# Patient Record
Sex: Female | Born: 1940 | Race: White | Hispanic: No | Marital: Married | Smoking: Never smoker
Health system: Southern US, Community
[De-identification: ages and names within clinical notes are randomized; demographics above are authoritative.]

## PROBLEM LIST (undated history)

## (undated) DIAGNOSIS — B029 Zoster without complications: Secondary | ICD-10-CM

## (undated) DIAGNOSIS — R0789 Other chest pain: Secondary | ICD-10-CM

## (undated) DIAGNOSIS — E785 Hyperlipidemia, unspecified: Secondary | ICD-10-CM

## (undated) DIAGNOSIS — I10 Essential (primary) hypertension: Secondary | ICD-10-CM

## (undated) DIAGNOSIS — I8390 Asymptomatic varicose veins of unspecified lower extremity: Secondary | ICD-10-CM

## (undated) DIAGNOSIS — E039 Hypothyroidism, unspecified: Secondary | ICD-10-CM

## (undated) DIAGNOSIS — G8929 Other chronic pain: Secondary | ICD-10-CM

## (undated) DIAGNOSIS — K219 Gastro-esophageal reflux disease without esophagitis: Secondary | ICD-10-CM

## (undated) DIAGNOSIS — E079 Disorder of thyroid, unspecified: Secondary | ICD-10-CM

## (undated) DIAGNOSIS — H269 Unspecified cataract: Secondary | ICD-10-CM

## (undated) DIAGNOSIS — M4800 Spinal stenosis, site unspecified: Secondary | ICD-10-CM

## (undated) DIAGNOSIS — M549 Dorsalgia, unspecified: Secondary | ICD-10-CM

## (undated) DIAGNOSIS — E7800 Pure hypercholesterolemia, unspecified: Secondary | ICD-10-CM

## (undated) DIAGNOSIS — Z8719 Personal history of other diseases of the digestive system: Secondary | ICD-10-CM

## (undated) DIAGNOSIS — Z87442 Personal history of urinary calculi: Secondary | ICD-10-CM

## (undated) DIAGNOSIS — M1990 Unspecified osteoarthritis, unspecified site: Secondary | ICD-10-CM

## (undated) DIAGNOSIS — E6609 Other obesity due to excess calories: Secondary | ICD-10-CM

## (undated) HISTORY — PX: TONSILLECTOMY: SUR1361

## (undated) HISTORY — PX: THYROIDECTOMY, PARTIAL: SHX18

## (undated) HISTORY — DX: Disorder of thyroid, unspecified: E07.9

## (undated) HISTORY — DX: Other obesity due to excess calories: E66.09

## (undated) HISTORY — DX: Asymptomatic varicose veins of unspecified lower extremity: I83.90

## (undated) HISTORY — DX: Dorsalgia, unspecified: M54.9

## (undated) HISTORY — DX: Essential (primary) hypertension: I10

## (undated) HISTORY — DX: Hyperlipidemia, unspecified: E78.5

## (undated) HISTORY — DX: Other chest pain: R07.89

## (undated) HISTORY — PX: NISSEN FUNDOPLICATION: SHX2091

## (undated) HISTORY — DX: Pure hypercholesterolemia, unspecified: E78.00

## (undated) HISTORY — PX: CHOLECYSTECTOMY: SHX55

## (undated) HISTORY — PX: APPENDECTOMY: SHX54

## (undated) HISTORY — DX: Other chronic pain: G89.29

## (undated) HISTORY — PX: TONSILLECTOMY AND ADENOIDECTOMY: SHX28

## (undated) HISTORY — DX: Gastro-esophageal reflux disease without esophagitis: K21.9

## (undated) HISTORY — DX: Spinal stenosis, site unspecified: M48.00

## (undated) HISTORY — DX: Unspecified cataract: H26.9

## (undated) HISTORY — DX: Zoster without complications: B02.9

## (undated) HISTORY — DX: Hypothyroidism, unspecified: E03.9

---

## 1975-11-26 HISTORY — PX: COMBINED AUGMENTATION MAMMAPLASTY AND ABDOMINOPLASTY: SUR291

## 1975-11-26 HISTORY — PX: VARICOSE VEIN SURGERY: SHX832

## 1983-11-26 HISTORY — PX: ABDOMINAL HYSTERECTOMY: SHX81

## 1998-11-25 HISTORY — PX: UPPER ENDOSCOPY W/ SCLEROTHERAPY: SHX2606

## 1998-12-11 ENCOUNTER — Other Ambulatory Visit: Admission: RE | Admit: 1998-12-11 | Discharge: 1998-12-11 | Payer: Self-pay | Admitting: Otolaryngology

## 1999-11-26 HISTORY — PX: ROTATOR CUFF REPAIR: SHX139

## 2000-08-04 ENCOUNTER — Other Ambulatory Visit: Admission: RE | Admit: 2000-08-04 | Discharge: 2000-08-04 | Payer: Self-pay | Admitting: *Deleted

## 2000-11-25 HISTORY — PX: HAMMER TOE SURGERY: SHX385

## 2000-11-25 HISTORY — PX: KIDNEY STONE SURGERY: SHX686

## 2001-05-05 ENCOUNTER — Ambulatory Visit (HOSPITAL_COMMUNITY): Admission: AD | Admit: 2001-05-05 | Discharge: 2001-05-05 | Payer: Self-pay | Admitting: Urology

## 2001-05-12 ENCOUNTER — Encounter: Payer: Self-pay | Admitting: Emergency Medicine

## 2001-05-12 ENCOUNTER — Emergency Department (HOSPITAL_COMMUNITY): Admission: EM | Admit: 2001-05-12 | Discharge: 2001-05-12 | Payer: Self-pay | Admitting: Emergency Medicine

## 2001-09-01 ENCOUNTER — Encounter: Admission: RE | Admit: 2001-09-01 | Discharge: 2001-09-01 | Payer: Self-pay | Admitting: Orthopedic Surgery

## 2001-09-01 ENCOUNTER — Encounter: Payer: Self-pay | Admitting: Orthopedic Surgery

## 2002-09-22 ENCOUNTER — Ambulatory Visit (HOSPITAL_COMMUNITY): Admission: RE | Admit: 2002-09-22 | Discharge: 2002-09-22 | Payer: Self-pay | Admitting: Gastroenterology

## 2002-09-27 ENCOUNTER — Encounter: Payer: Self-pay | Admitting: Gastroenterology

## 2002-09-27 ENCOUNTER — Encounter: Admission: RE | Admit: 2002-09-27 | Discharge: 2002-09-27 | Payer: Self-pay | Admitting: Gastroenterology

## 2002-10-08 ENCOUNTER — Encounter: Admission: RE | Admit: 2002-10-08 | Discharge: 2002-10-08 | Payer: Self-pay | Admitting: Surgery

## 2002-10-08 ENCOUNTER — Encounter: Payer: Self-pay | Admitting: Surgery

## 2002-10-26 ENCOUNTER — Encounter: Admission: RE | Admit: 2002-10-26 | Discharge: 2002-10-26 | Payer: Self-pay | Admitting: Gastroenterology

## 2002-10-26 ENCOUNTER — Encounter: Payer: Self-pay | Admitting: Gastroenterology

## 2002-11-23 ENCOUNTER — Ambulatory Visit (HOSPITAL_COMMUNITY): Admission: RE | Admit: 2002-11-23 | Discharge: 2002-11-23 | Payer: Self-pay | Admitting: Gastroenterology

## 2002-11-25 HISTORY — PX: HERNIA REPAIR: SHX51

## 2002-12-06 ENCOUNTER — Encounter: Payer: Self-pay | Admitting: Surgery

## 2002-12-08 ENCOUNTER — Encounter: Payer: Self-pay | Admitting: Surgery

## 2002-12-08 ENCOUNTER — Inpatient Hospital Stay (HOSPITAL_COMMUNITY): Admission: RE | Admit: 2002-12-08 | Discharge: 2002-12-12 | Payer: Self-pay | Admitting: Surgery

## 2002-12-09 ENCOUNTER — Encounter: Payer: Self-pay | Admitting: Surgery

## 2003-11-26 HISTORY — PX: SPINAL FUSION: SHX223

## 2004-07-26 HISTORY — PX: BREAST BIOPSY: SHX20

## 2004-08-09 ENCOUNTER — Inpatient Hospital Stay (HOSPITAL_COMMUNITY): Admission: RE | Admit: 2004-08-09 | Discharge: 2004-08-14 | Payer: Self-pay | Admitting: Specialist

## 2004-10-25 HISTORY — PX: EYE SURGERY: SHX253

## 2005-05-20 ENCOUNTER — Encounter: Admission: RE | Admit: 2005-05-20 | Discharge: 2005-05-20 | Payer: Self-pay | Admitting: Cardiology

## 2005-05-22 ENCOUNTER — Encounter: Admission: RE | Admit: 2005-05-22 | Discharge: 2005-05-22 | Payer: Self-pay | Admitting: Orthopedic Surgery

## 2005-06-06 ENCOUNTER — Inpatient Hospital Stay (HOSPITAL_COMMUNITY): Admission: AD | Admit: 2005-06-06 | Discharge: 2005-06-07 | Payer: Self-pay | Admitting: Otolaryngology

## 2006-01-15 ENCOUNTER — Encounter: Admission: RE | Admit: 2006-01-15 | Discharge: 2006-01-15 | Payer: Self-pay | Admitting: Gastroenterology

## 2010-11-25 HISTORY — PX: ROTATOR CUFF REPAIR: SHX139

## 2010-12-16 ENCOUNTER — Encounter: Payer: Self-pay | Admitting: Cardiology

## 2010-12-16 ENCOUNTER — Encounter: Payer: Self-pay | Admitting: Orthopedic Surgery

## 2011-03-27 ENCOUNTER — Encounter: Payer: Self-pay | Admitting: Orthopedic Surgery

## 2011-04-19 ENCOUNTER — Encounter: Payer: Self-pay | Admitting: Physician Assistant

## 2011-05-21 ENCOUNTER — Encounter: Payer: Self-pay | Admitting: Cardiology

## 2011-07-24 ENCOUNTER — Ambulatory Visit: Payer: Self-pay | Admitting: Cardiology

## 2011-10-02 ENCOUNTER — Encounter: Payer: Self-pay | Admitting: Cardiology

## 2011-10-02 ENCOUNTER — Ambulatory Visit (INDEPENDENT_AMBULATORY_CARE_PROVIDER_SITE_OTHER): Payer: Self-pay | Admitting: Cardiology

## 2011-10-02 VITALS — BP 140/80 | HR 78 | Ht 63.0 in | Wt 198.0 lb

## 2011-10-02 DIAGNOSIS — I119 Hypertensive heart disease without heart failure: Secondary | ICD-10-CM

## 2011-10-02 DIAGNOSIS — R519 Headache, unspecified: Secondary | ICD-10-CM | POA: Insufficient documentation

## 2011-10-02 DIAGNOSIS — R51 Headache: Secondary | ICD-10-CM

## 2011-10-02 DIAGNOSIS — E7800 Pure hypercholesterolemia, unspecified: Secondary | ICD-10-CM | POA: Insufficient documentation

## 2011-10-02 LAB — HEPATIC FUNCTION PANEL
Bilirubin, Direct: 0.1 mg/dL (ref 0.0–0.3)
Total Bilirubin: 1.7 mg/dL — ABNORMAL HIGH (ref 0.3–1.2)

## 2011-10-02 LAB — LIPID PANEL
Cholesterol: 176 mg/dL (ref 0–200)
HDL: 63.4 mg/dL (ref >39.00)

## 2011-10-02 LAB — BASIC METABOLIC PANEL
BUN: 18 mg/dL (ref 6–23)
CO2: 32 mEq/L (ref 19–32)
Calcium: 9.7 mg/dL (ref 8.4–10.5)
Chloride: 104 mEq/L (ref 96–112)
Glucose, Bld: 88 mg/dL (ref 70–99)
Sodium: 144 mEq/L (ref 135–145)

## 2011-10-02 MED ORDER — LOSARTAN POTASSIUM 50 MG PO TABS: 50.0000 mg | ORAL_TABLET | Freq: Every day | ORAL | Status: DC

## 2011-10-02 NOTE — Progress Notes (Signed)
Benjiman Core Date of Birth:  1941-05-24 [facility] [address]   [address] [place], [place]  [address] [phone]           Fax   [phone]  History of Present Illness: This pleasant 70 year old married Caucasian woman is seen for valuation of high blood pressure.  She has a past history of essential hypertension.  More recently she has been on no blood pressure medications.  She had been able to control her blood pressure adequately with diet and exercise.  However she has noted some recent morning headaches and has been checking her blood pressure and has noted has been as high as 154/89 and 161/74 recently.  She has gained weight recently but is trying to avoid dietary salt.  She has not been experiencing any chest pain.  She does not have any history of ischemic heart disease and she had a normal adenosine Cardiolite stress test in 2005  Current Outpatient Prescriptions  Medication Sig Dispense Refill  . aspirin 81 MG tablet Take 81 mg by mouth daily.        . celecoxib (CELEBREX) 200 MG capsule Take 200 mg by mouth 2 (two) times daily. As needed       . Cholecalciferol (VITAMIN D PO) Take by mouth. 50,000 2 tab weekly       . esomeprazole (NEXIUM) 40 MG capsule Take 40 mg by mouth daily before breakfast.        . estradiol (CLIMARA) 0.025 mg/24hr Place 1 patch onto the skin once a week.        . levothyroxine (SYNTHROID, LEVOTHROID) 100 MCG tablet Take 100 mcg by mouth daily. Taking 10 tablets weekly       . Multiple Vitamin (MULTIVITAMIN PO) Take by mouth daily.        . niacin 250 MG CR capsule Take 250 mg by mouth at bedtime.        . simvastatin (ZOCOR) 20 MG tablet Take 20 mg by mouth at bedtime.        Marland Kitchen losartan (COZAAR) 50 MG tablet Take 1 tablet (50 mg total) by mouth daily.  30 tablet  11    Not on File  There is no problem list on file for this patient.   History  Smoking status  . Never Smoker   Smokeless tobacco  . Not on file      History  Alcohol Use: Not on file    No family history on file.  Review of Systems: Constitutional: no fever chills diaphoresis or fatigue or change in weight.  Head and neck: no hearing loss, no epistaxis, no photophobia or visual disturbance.  The patient is clinically euthyroid on her current thyroid replacement. Respiratory: No cough, shortness of breath or wheezing. Cardiovascular: No chest pain peripheral edema, palpitations. Gastrointestinal: No abdominal distention, no abdominal pain, no change in bowel habits hematochezia or melena. Genitourinary: No dysuria, no frequency, no urgency, no nocturia. Musculoskeletal:No arthralgias, no back pain, no gait disturbance or myalgias. Neurological: No dizziness, no headaches, no numbness, no seizures, no syncope, no weakness, no tremors. Hematologic: No lymphadenopathy, no easy bruising. Psychiatric: No confusion, no hallucinations, no sleep disturbance.    Physical Exam: Filed Vitals:   10/02/11 1159  BP: 140/80  Pulse: 78   general appearance reveals a well-developed well-nourished woman in no distress.The head and neck exam reveals pupils equal and reactive.  Extraocular movements are full.  There is no scleral icterus.  The mouth and pharynx are normal.  The neck is supple.  The carotids reveal no bruits.  The jugular venous pressure is normal.  The  thyroid is not enlarged.  There is no lymphadenopathy.  The chest is clear to percussion and auscultation.  There are no rales or rhonchi.  Expansion of the chest is symmetrical.  The precordium is quiet.  The first heart sound is normal.  The second heart sound is physiologically split.  There is no murmur gallop rub or click.  There is no abnormal lift or heave.  The abdomen is soft and nontender.  The bowel sounds are normal.  The liver and spleen are not enlarged.  There are no abdominal masses.  There are no abdominal bruits.  Extremities reveal good pedal pulses.  There is no  phlebitis or edema.  There is no cyanosis or clubbing.  Strength is normal and symmetrical in all extremities.  There is no lateralizing weakness.  There are no sensory deficits.  The skin is warm and dry.  There is no rash.     Assessment / Plan: Begin losartan 50 mg one daily.  Recheck in 4 months for followup office visit.  She will call us with  response to this medication.

## 2011-10-02 NOTE — Patient Instructions (Addendum)
Will start you on Losartan 50 mg daily, this has been sent to [facility] Will obtain labs today and call you with the results Your physician recommends that you schedule a follow-up appointment in: 4 months

## 2011-10-02 NOTE — Assessment & Plan Note (Signed)
The patient has a history of hypercholesterolemia.  Her last labs were done at Dr. Enid Derry office in May showing a cholesterol of 141 and a triglyceride of 75.  We are rechecking labs today

## 2011-10-02 NOTE — Assessment & Plan Note (Signed)
In the past the patient has been on lisinopril HCTZ she did not tolerate because of nausea.  Her blood pressure today is elevated.  I checked it myself and got 146/94.  We then checked her home machine which read 150/85.  We will start her on losartan 50 mg one daily

## 2011-10-02 NOTE — Assessment & Plan Note (Signed)
Her headaches it tends to occur in the morning.  They are dull.  They relieved by aspirin.  She is not having symptoms of migraine.

## 2011-10-04 ENCOUNTER — Telehealth: Payer: Self-pay | Admitting: *Deleted

## 2011-10-04 NOTE — Telephone Encounter (Signed)
Message copied by Burnell Blanks on Fri Oct 04, 2011  4:53 PM ------      Message from: Cassell Clement      Created: Wed Oct 02, 2011  6:12 PM       Please report.  The liver tests are good except for slight elevation of bilirubin which does not appear to be significant.  The electrolytes are normal blood sugar is normal and the kidney function is normal.  Her lipi.ds are normal triglycerides are normal.  Continue present medication

## 2011-10-04 NOTE — Telephone Encounter (Signed)
Message copied by Burnell Blanks on Fri Oct 04, 2011  5:04 PM ------      Message from: Cassell Clement      Created: Wed Oct 02, 2011  6:12 PM       Please report.  The liver tests are good except for slight elevation of bilirubin which does not appear to be significant.  The electrolytes are normal blood sugar is normal and the kidney function is normal.  Her lipi.ds are normal triglycerides are normal.  Continue present medication

## 2011-10-04 NOTE — Telephone Encounter (Signed)
Advised of labs and mailed copy 

## 2011-10-04 NOTE — Telephone Encounter (Signed)
Advised of labs 

## 2012-02-04 ENCOUNTER — Encounter: Payer: Self-pay | Admitting: Cardiology

## 2012-02-04 ENCOUNTER — Other Ambulatory Visit: Payer: Self-pay

## 2012-02-04 ENCOUNTER — Ambulatory Visit (INDEPENDENT_AMBULATORY_CARE_PROVIDER_SITE_OTHER): Payer: Medicare Other | Admitting: Cardiology

## 2012-02-04 VITALS — BP 132/88 | HR 68 | Ht 62.0 in | Wt 204.4 lb

## 2012-02-04 DIAGNOSIS — E7800 Pure hypercholesterolemia, unspecified: Secondary | ICD-10-CM

## 2012-02-04 DIAGNOSIS — K219 Gastro-esophageal reflux disease without esophagitis: Secondary | ICD-10-CM

## 2012-02-04 DIAGNOSIS — I119 Hypertensive heart disease without heart failure: Secondary | ICD-10-CM

## 2012-02-04 LAB — BASIC METABOLIC PANEL
BUN: 18 mg/dL (ref 6–23)
Chloride: 104 mEq/L (ref 96–112)
Glucose, Bld: 99 mg/dL (ref 70–99)
Potassium: 3.8 mEq/L (ref 3.5–5.1)
Sodium: 140 mEq/L (ref 135–145)

## 2012-02-04 LAB — HEPATIC FUNCTION PANEL
ALT: 13 U/L (ref 0–35)
Bilirubin, Direct: 0.1 mg/dL (ref 0.0–0.3)
Total Bilirubin: 0.8 mg/dL (ref 0.3–1.2)

## 2012-02-04 LAB — LIPID PANEL
HDL: 58.6 mg/dL (ref >39.00)
LDL Cholesterol: 72 mg/dL (ref 0–99)
Triglycerides: 116 mg/dL (ref 0.0–149.0)

## 2012-02-04 MED ORDER — OMEPRAZOLE-SODIUM BICARBONATE 40-1100 MG PO CAPS: 1.0000 | ORAL_CAPSULE | Freq: Every day | ORAL | Status: DC

## 2012-02-04 MED ORDER — PANTOPRAZOLE SODIUM 40 MG PO TBEC: 40.0000 mg | DELAYED_RELEASE_TABLET | Freq: Every day | ORAL | Status: DC

## 2012-02-04 MED ORDER — SIMVASTATIN 40 MG PO TABS: 40.0000 mg | ORAL_TABLET | Freq: Every day | ORAL | Status: DC

## 2012-02-04 NOTE — Assessment & Plan Note (Signed)
The patient has had a good clinical response to Zegerid.  We renewed her prescription today.

## 2012-02-04 NOTE — Patient Instructions (Addendum)
Will obtain labs today and call you with the results (lp/bmet/hfp)  Your physician wants you to follow-up in: 4 month You will receive a reminder letter in the mail two months in advance. If you don't receive a letter, please call our office to schedule the follow-up appointment.

## 2012-02-04 NOTE — Progress Notes (Signed)
Benjiman Core Date of Birth:  1941/05/21 [facility] [address] [place], [place]  [address] [phone]  Fax   [phone]  HPI: This pleasant 71 year old woman is seen for a scheduled four-month followup office visit.  She is a past history of essential hypertension and a history of hypercholesterolemia.  She also has a lot of orthopedic problems and problems with arthritis.  She also has a history of hypothyroidism and a history of gastroesophageal reflux.  She has been monitoring her blood pressure at home and it has been in the normal range.  She does not have any history of ischemic heart disease and she had a normal adenosine Cardiolite stress test in 2005.  Current Outpatient Prescriptions  Medication Sig Dispense Refill  . aspirin 81 MG tablet Take 81 mg by mouth daily.        . celecoxib (CELEBREX) 200 MG capsule Take 200 mg by mouth 2 (two) times daily. As needed       . Cholecalciferol (VITAMIN D PO) Take by mouth. Every other week      . esomeprazole (NEXIUM) 40 MG capsule Take 40 mg by mouth daily before breakfast.        . estradiol (CLIMARA) 0.025 mg/24hr Place 1 patch onto the skin once a week.        Marland Kitchen ibuprofen (ADVIL,MOTRIN) 200 MG tablet Take 200 mg by mouth every 6 (six) hours as needed.      Marland Kitchen levothyroxine (SYNTHROID, LEVOTHROID) 150 MCG tablet Take 150 mcg by mouth daily.      Marland Kitchen losartan (COZAAR) 50 MG tablet Take 1 tablet (50 mg total) by mouth daily.  30 tablet  11  . Multiple Vitamin (MULTIVITAMIN PO) Take by mouth daily.        . multivitamin-lutein (OCUVITE-LUTEIN) CAPS Take 1 capsule by mouth daily.      . niacin 250 MG CR capsule Take 250 mg by mouth at bedtime.        . simvastatin (ZOCOR) 40 MG tablet Take 1 tablet (40 mg total) by mouth at bedtime.  30 tablet  11  . omeprazole-sodium bicarbonate (ZEGERID) 40-1100 MG per capsule Take 1 capsule by mouth daily before breakfast.  30 capsule  11    No Known Allergies  Patient  Active Problem List  Diagnoses  . Benign hypertensive heart disease without heart failure  . Pure hypercholesterolemia  . Headache    History  Smoking status  . Never Smoker   Smokeless tobacco  . Not on file    History  Alcohol Use: Not on file    No family history on file.  Review of Systems: The patient denies any heat or cold intolerance.  No weight gain or weight loss.  The patient denies headaches or blurry vision.  There is no cough or sputum production.  The patient denies dizziness.  There is no hematuria or hematochezia.  The patient denies any muscle aches or arthritis.  The patient denies any rash.  The patient denies frequent falling or instability.  There is no history of depression or anxiety.  All other systems were reviewed and are negative.   Physical Exam: Filed Vitals:   02/04/12 0855  BP: 132/88  Pulse: 68   the general appearance reveals a well-developed well-nourished woman in no distress.The head and neck exam reveals pupils equal and reactive.  Extraocular movements are full.  There is no scleral icterus.  The mouth and pharynx are normal.  The  neck is supple.  The carotids reveal no bruits.  The jugular venous pressure is normal.  The  thyroid is not enlarged.  There is no lymphadenopathy.  The chest is clear to percussion and auscultation.  There are no rales or rhonchi.  Expansion of the chest is symmetrical.  The precordium is quiet.  The first heart sound is normal.  The second heart sound is physiologically split.  There is no murmur gallop rub or click.  There is no abnormal lift or heave.  The abdomen is soft and nontender.  The bowel sounds are normal.  The liver and spleen are not enlarged.  There are no abdominal masses.  There are no abdominal bruits.  Extremities reveal good pedal pulses.  There is no phlebitis or edema.  There is no cyanosis or clubbing.  Strength is normal and symmetrical in all extremities.  There is no lateralizing weakness.   There are no sensory deficits.  The skin is warm and dry.  There is no rash.      Assessment / Plan: She will continue to work hard on her diet to lose weight.  Continue regular aerobic exercise.  Continue present medications.  Blood work today pending.  Going to send a copy of her lab work to Dr. Evlyn Kanner.  She will be rechecked in 4 months for followup office visit and fasting lab work

## 2012-02-04 NOTE — Progress Notes (Signed)
Quick Note:  Please report to patient. The recent labs are stable. Continue same medication and careful diet. LDL better. ______ 

## 2012-02-04 NOTE — Assessment & Plan Note (Signed)
No chest pain or shortness of breath.  The patient has been using her home treadmill for exercise.

## 2012-02-04 NOTE — Assessment & Plan Note (Signed)
The patient has been on simvastatin 40 mg daily rather than 20 mg daily.  We are checking lab work today.  She is not having any side effects from the simvastatin such as myalgias.

## 2012-02-05 ENCOUNTER — Other Ambulatory Visit: Payer: Self-pay | Admitting: *Deleted

## 2012-02-05 NOTE — Telephone Encounter (Signed)
Pharmacy called regarding pts Zegerid and if the prior Auth was completed. Per Juliette Alcide it has not been done yet, she has to talk to the Doctor about it...Marland Kitchenoc rma

## 2012-02-06 ENCOUNTER — Telehealth: Payer: Self-pay | Admitting: *Deleted

## 2012-02-06 NOTE — Telephone Encounter (Signed)
Advised of labs 

## 2012-02-06 NOTE — Telephone Encounter (Signed)
Message copied by Burnell Blanks on Thu Feb 06, 2012  5:30 PM ------      Message from: Cassell Clement      Created: Tue Feb 04, 2012  9:56 PM       Please report to patient.  The recent labs are stable. Continue same medication and careful diet.LDL better.

## 2012-02-11 ENCOUNTER — Encounter: Payer: Self-pay | Admitting: *Deleted

## 2012-02-19 ENCOUNTER — Other Ambulatory Visit: Payer: Self-pay | Admitting: *Deleted

## 2012-02-25 ENCOUNTER — Telehealth: Payer: Self-pay

## 2012-02-25 MED ORDER — OMEPRAZOLE-SODIUM BICARBONATE 40-1100 MG PO CAPS: 1.0000 | ORAL_CAPSULE | Freq: Every day | ORAL | Status: DC

## 2012-02-25 NOTE — Telephone Encounter (Signed)
New Msg: Pharmacy calling to get Pre-Auth for zegerid. Please return call to pharmacy to discuss further.

## 2012-02-25 NOTE — Telephone Encounter (Signed)
Left message working on prior authorization.   Dr. Patty Sermons needs to sign and will fax back

## 2012-03-04 ENCOUNTER — Telehealth: Payer: Self-pay | Admitting: *Deleted

## 2012-03-04 ENCOUNTER — Telehealth: Payer: Self-pay | Admitting: Cardiology

## 2012-03-04 NOTE — Telephone Encounter (Signed)
left message  Regis Bill, LPN 03/03/8118 14:78 AM Signed  Patient insurance will not cover medication however the lower dose Zegrid 20/1,100 is OTC. Will discuss with patient her getting OTC and getting Omeprazole 20 mg Rx and taking together. Left message at patients home to call back

## 2012-03-04 NOTE — Telephone Encounter (Signed)
Needing to know about Pre-Auth for Zegerid.  Last note stated was needing Dr. Patty Sermons to sign and to be faxed back.

## 2012-03-04 NOTE — Telephone Encounter (Signed)
Fu call °Patient returning your call °

## 2012-03-04 NOTE — Telephone Encounter (Signed)
Patient insurance will not cover medication however the lower dose Zegrid 20/1,100 is OTC.  Will discuss with patient her getting OTC and getting Omeprazole 20 mg Rx and taking together.  Left message at patients home to call back

## 2012-03-25 HISTORY — PX: ROTATOR CUFF REPAIR: SHX139

## 2012-03-31 ENCOUNTER — Encounter (INDEPENDENT_AMBULATORY_CARE_PROVIDER_SITE_OTHER): Payer: Self-pay | Admitting: Ophthalmology

## 2012-04-08 ENCOUNTER — Encounter (INDEPENDENT_AMBULATORY_CARE_PROVIDER_SITE_OTHER): Payer: Medicare Other | Admitting: Ophthalmology

## 2012-04-08 DIAGNOSIS — H43819 Vitreous degeneration, unspecified eye: Secondary | ICD-10-CM

## 2012-04-08 DIAGNOSIS — H2510 Age-related nuclear cataract, unspecified eye: Secondary | ICD-10-CM

## 2012-04-08 DIAGNOSIS — H33309 Unspecified retinal break, unspecified eye: Secondary | ICD-10-CM

## 2012-04-08 NOTE — Telephone Encounter (Signed)
Patient never called back, did speak with pharmacist previously regarding medications

## 2012-05-13 ENCOUNTER — Encounter: Payer: Self-pay | Admitting: Physician Assistant

## 2012-05-13 ENCOUNTER — Observation Stay (HOSPITAL_COMMUNITY): Payer: PRIVATE HEALTH INSURANCE

## 2012-05-13 ENCOUNTER — Observation Stay (HOSPITAL_COMMUNITY)
Admission: AD | Admit: 2012-05-13 | Discharge: 2012-05-14 | Disposition: A | Discharge status: Home / Self Care | Payer: PRIVATE HEALTH INSURANCE | Source: Ambulatory Visit | Attending: Cardiology | Admitting: Cardiology

## 2012-05-13 ENCOUNTER — Encounter (HOSPITAL_COMMUNITY): Payer: Self-pay | Admitting: General Practice

## 2012-05-13 ENCOUNTER — Ambulatory Visit (INDEPENDENT_AMBULATORY_CARE_PROVIDER_SITE_OTHER): Payer: Medicare Other | Admitting: Physician Assistant

## 2012-05-13 VITALS — BP 135/76 | HR 67 | Ht 63.0 in | Wt 203.0 lb

## 2012-05-13 DIAGNOSIS — M4800 Spinal stenosis, site unspecified: Secondary | ICD-10-CM | POA: Insufficient documentation

## 2012-05-13 DIAGNOSIS — G8929 Other chronic pain: Secondary | ICD-10-CM | POA: Insufficient documentation

## 2012-05-13 DIAGNOSIS — R079 Chest pain, unspecified: Principal | ICD-10-CM | POA: Diagnosis present

## 2012-05-13 DIAGNOSIS — E7800 Pure hypercholesterolemia, unspecified: Secondary | ICD-10-CM | POA: Diagnosis present

## 2012-05-13 DIAGNOSIS — I10 Essential (primary) hypertension: Secondary | ICD-10-CM | POA: Insufficient documentation

## 2012-05-13 DIAGNOSIS — E039 Hypothyroidism, unspecified: Secondary | ICD-10-CM | POA: Insufficient documentation

## 2012-05-13 DIAGNOSIS — M549 Dorsalgia, unspecified: Secondary | ICD-10-CM | POA: Insufficient documentation

## 2012-05-13 DIAGNOSIS — K219 Gastro-esophageal reflux disease without esophagitis: Secondary | ICD-10-CM | POA: Insufficient documentation

## 2012-05-13 DIAGNOSIS — E785 Hyperlipidemia, unspecified: Secondary | ICD-10-CM | POA: Insufficient documentation

## 2012-05-13 DIAGNOSIS — E669 Obesity, unspecified: Secondary | ICD-10-CM | POA: Insufficient documentation

## 2012-05-13 DIAGNOSIS — I119 Hypertensive heart disease without heart failure: Secondary | ICD-10-CM

## 2012-05-13 HISTORY — DX: Personal history of other diseases of the digestive system: Z87.19

## 2012-05-13 HISTORY — DX: Unspecified osteoarthritis, unspecified site: M19.90

## 2012-05-13 LAB — COMPREHENSIVE METABOLIC PANEL
Albumin: 3.8 g/dL (ref 3.5–5.2)
Alkaline Phosphatase: 81 U/L (ref 39–117)
BUN: 27 mg/dL — ABNORMAL HIGH (ref 6–23)
Chloride: 101 mEq/L (ref 96–112)
Creatinine, Ser: 0.67 mg/dL (ref 0.50–1.10)
GFR calc Af Amer: >90 mL/min (ref >90)
Glucose, Bld: 93 mg/dL (ref 70–99)
Potassium: 3.6 mEq/L (ref 3.5–5.1)
Total Bilirubin: 0.9 mg/dL (ref 0.3–1.2)
Total Protein: 7.2 g/dL (ref 6.0–8.3)

## 2012-05-13 LAB — CARDIAC PANEL(CRET KIN+CKTOT+MB+TROPI)
CK, MB: 2.3 ng/mL (ref 0.3–4.0)
Relative Index: INVALID (ref 0.0–2.5)
Troponin I: <0.3 ng/mL (ref <0.30)
Troponin I: <0.3 ng/mL (ref <0.30)

## 2012-05-13 LAB — CBC
HCT: 38.1 % (ref 36.0–46.0)
Hemoglobin: 12.7 g/dL (ref 12.0–15.0)
MCH: 27.7 pg (ref 26.0–34.0)
MCHC: 33.3 g/dL (ref 30.0–36.0)
MCV: 83.2 fL (ref 78.0–100.0)
RBC: 4.58 MIL/uL (ref 3.87–5.11)

## 2012-05-13 LAB — PROTIME-INR: INR: 0.96 (ref 0.00–1.49)

## 2012-05-13 LAB — DIFFERENTIAL
Basophils Relative: 1 % (ref 0–1)
Eosinophils Absolute: 0 10*3/uL (ref 0.0–0.7)
Lymphs Abs: 2.3 10*3/uL (ref 0.7–4.0)
Monocytes Absolute: 0.7 10*3/uL (ref 0.1–1.0)
Monocytes Relative: 8 % (ref 3–12)
Neutrophils Relative %: 62 % (ref 43–77)

## 2012-05-13 MED ORDER — IBUPROFEN 200 MG PO TABS
200.0000 mg | ORAL_TABLET | Freq: Four times a day (QID) | ORAL | Status: DC | PRN
  Filled 2012-05-13: qty 1

## 2012-05-13 MED ORDER — ONDANSETRON HCL 4 MG/2ML IJ SOLN: 4.0000 mg | Freq: Four times a day (QID) | INTRAMUSCULAR | Status: DC | PRN

## 2012-05-13 MED ORDER — ESTRADIOL 0.025 MG/24HR TD PTWK
0.0250 mg | MEDICATED_PATCH | TRANSDERMAL | Status: DC
  Filled 2012-05-13: qty 1

## 2012-05-13 MED ORDER — SODIUM CHLORIDE 0.9 % IJ SOLN
3.0000 mL | Freq: Two times a day (BID) | INTRAMUSCULAR | Status: DC
  Administered 2012-05-13: 3 mL via INTRAVENOUS

## 2012-05-13 MED ORDER — ASPIRIN 81 MG PO CHEW
324.0000 mg | CHEWABLE_TABLET | ORAL | Status: AC
  Administered 2012-05-13: 324 mg via ORAL
  Filled 2012-05-13: qty 4

## 2012-05-13 MED ORDER — NIACIN ER 250 MG PO CPCR
250.0000 mg | ORAL_CAPSULE | Freq: Every day | ORAL | Status: DC
  Administered 2012-05-13: 250 mg via ORAL
  Filled 2012-05-13 (×2): qty 1

## 2012-05-13 MED ORDER — ACETAMINOPHEN 325 MG PO TABS: 650.0000 mg | ORAL_TABLET | ORAL | Status: DC | PRN

## 2012-05-13 MED ORDER — LEVOTHYROXINE SODIUM 150 MCG PO TABS
150.0000 ug | ORAL_TABLET | Freq: Every day | ORAL | Status: DC
  Administered 2012-05-14: 150 ug via ORAL
  Filled 2012-05-13 (×2): qty 1

## 2012-05-13 MED ORDER — OCUVITE-LUTEIN PO CAPS
1.0000 | ORAL_CAPSULE | Freq: Every day | ORAL | Status: DC
  Administered 2012-05-14: 1 via ORAL
  Filled 2012-05-13 (×2): qty 1

## 2012-05-13 MED ORDER — ENOXAPARIN SODIUM 40 MG/0.4ML ~~LOC~~ SOLN
40.0000 mg | SUBCUTANEOUS | Status: DC
  Administered 2012-05-13: 40 mg via SUBCUTANEOUS
  Filled 2012-05-13 (×2): qty 0.4

## 2012-05-13 MED ORDER — SODIUM CHLORIDE 0.9 % IJ SOLN: 3.0000 mL | INTRAMUSCULAR | Status: DC | PRN

## 2012-05-13 MED ORDER — LOSARTAN POTASSIUM 50 MG PO TABS
50.0000 mg | ORAL_TABLET | Freq: Every day | ORAL | Status: DC
  Administered 2012-05-14: 50 mg via ORAL
  Filled 2012-05-13 (×2): qty 1

## 2012-05-13 MED ORDER — SODIUM CHLORIDE 0.9 % IV SOLN: 250.0000 mL | INTRAVENOUS | Status: DC | PRN

## 2012-05-13 MED ORDER — NITROGLYCERIN 0.4 MG SL SUBL: 0.4000 mg | SUBLINGUAL_TABLET | SUBLINGUAL | Status: DC | PRN

## 2012-05-13 MED ORDER — ASPIRIN 81 MG PO TABS: 81.0000 mg | ORAL_TABLET | Freq: Every day | ORAL | Status: DC

## 2012-05-13 MED ORDER — PANTOPRAZOLE SODIUM 40 MG PO TBEC
80.0000 mg | DELAYED_RELEASE_TABLET | Freq: Every day | ORAL | Status: DC
  Administered 2012-05-13: 80 mg via ORAL
  Filled 2012-05-13: qty 1

## 2012-05-13 MED ORDER — ASPIRIN 300 MG RE SUPP
300.0000 mg | RECTAL | Status: AC
  Filled 2012-05-13: qty 1

## 2012-05-13 MED ORDER — CELECOXIB 200 MG PO CAPS
200.0000 mg | ORAL_CAPSULE | Freq: Two times a day (BID) | ORAL | Status: DC
  Filled 2012-05-13 (×3): qty 1

## 2012-05-13 MED ORDER — SIMVASTATIN 40 MG PO TABS
40.0000 mg | ORAL_TABLET | Freq: Every day | ORAL | Status: DC
  Administered 2012-05-13: 40 mg via ORAL
  Filled 2012-05-13 (×2): qty 1

## 2012-05-13 MED ORDER — ASPIRIN EC 81 MG PO TBEC
81.0000 mg | DELAYED_RELEASE_TABLET | Freq: Every day | ORAL | Status: DC
  Administered 2012-05-14: 81 mg via ORAL
  Filled 2012-05-13: qty 1

## 2012-05-13 NOTE — Progress Notes (Signed)
[address] [place], [place]  [address] Phone: [phone] Fax:  [phone]  Date:  05/13/2012   Name:  Theresa Holmes   DOB:  [birth year]   MRN:  [id number]  PCP:  Julian Hy, MD  Primary Cardiologist:  Dr. Cassell Clement  Primary Electrophysiologist:  None    History of Present Illness: Theresa Holmes is a 71 y.o. female who returns to the office as an acute visit for chest pain.  She has a hx of HTN, HL, GERD, hypothyroidism.  She had a negative Cardiolite in 2005.  She is s/p Nissen fundoplication and is followed by Dr. Marjorie Smolder.  She called Dr. Patty Sermons this am with complaints of CP.  She has had several episodes of CP over the last month.  These occur suddenly.  Not brought on by exertion.  Pain is getting more frequent and more severe.  She had fairly severe pain this AM.  It is not her typical GERD pain.  No pleuritic CP or pain with lying supine.  Pain is substernal and pressure like.  It lasted one hour this am.  No radiation, assoc diaphoresis or dyspnea.  She does note dyspnea with more extreme activities.  No orthopnea, PND, edema.  Just got back from [place].  Pain started before going.  She notes pain recurred shortly before coming here.  She rates it 2/10.     Potassium  Date/Time Value Range Status  02/04/2012  9:32 AM 3.8  3.5 - 5.1 mEq/L Final     Creatinine, Ser  Date/Time Value Range Status  02/04/2012  9:32 AM 0.6  0.4 - 1.2 mg/dL Final     ALT  Date/Time Value Range Status  02/04/2012  9:32 AM 13  0 - 35 U/L Final   Lab Results  Component Value Date   CHOL 154 02/04/2012   HDL 58.60 02/04/2012   LDLCALC 72 02/04/2012   TRIG 116.0 02/04/2012   CHOLHDL 3 02/04/2012    Past Medical History  Diagnosis Date  . Hypertension   . Hyperlipidemia   . Exogenous obesity   . Hypothyroidism   . Chronic back pain   . Spinal stenosis   . Hypercholesterolemia   . Thyroid mass     right, benign  . GERD (gastroesophageal reflux  disease)     Past Surgical History  Procedure Date  . Abdominal hysterectomy 1985  . Tonsillectomy and adenoidectomy     as a child  . Hernia repair 2004  . Varicose vein surgery 1977  . Upper endoscopy w/ sclerotherapy 2000  . Hammer toe surgery 2002  . Cholecystectomy   . Rotator cuff repair 2001  . Nissen fundoplication   . Kidney stone surgery 2002     Current Outpatient Prescriptions  Medication Sig Dispense Refill  . aspirin 81 MG tablet Take 81 mg by mouth daily.        . celecoxib (CELEBREX) 200 MG capsule Take 200 mg by mouth 2 (two) times daily. As needed       . Cholecalciferol (VITAMIN D PO) Take by mouth. Every other week      . esomeprazole (NEXIUM) 40 MG capsule Take 40 mg by mouth daily before breakfast.        . estradiol (CLIMARA) 0.025 mg/24hr Place 1 patch onto the skin once a week.        Marland Kitchen ibuprofen (ADVIL,MOTRIN) 200 MG tablet Take 200 mg by mouth every 6 (six) hours as needed.      Marland Kitchen  levothyroxine (SYNTHROID, LEVOTHROID) 150 MCG tablet Take 150 mcg by mouth daily.      Marland Kitchen losartan (COZAAR) 50 MG tablet Take 1 tablet (50 mg total) by mouth daily.  30 tablet  11  . Multiple Vitamin (MULTIVITAMIN PO) Take by mouth daily.        . multivitamin-lutein (OCUVITE-LUTEIN) CAPS Take 1 capsule by mouth daily.      . niacin 250 MG CR capsule Take 250 mg by mouth at bedtime.        Marland Kitchen omeprazole-sodium bicarbonate (ZEGERID) 40-1100 MG per capsule Take 1 capsule by mouth daily before breakfast.  30 capsule  11  . omeprazole-sodium bicarbonate (ZEGERID) 40-1100 MG per capsule Take 1 capsule by mouth daily before breakfast.  30 capsule  10  . pantoprazole (PROTONIX) 40 MG tablet Take 1 tablet (40 mg total) by mouth daily.  30 tablet  5  . simvastatin (ZOCOR) 40 MG tablet Take 1 tablet (40 mg total) by mouth at bedtime.  30 tablet  11    Allergies: No Known Allergies  History  Substance Use Topics  . Smoking status: Never Smoker   . Smokeless tobacco: Not on file  .  Alcohol Use: Not on file      Family History  Problem Relation Age of Onset  . Kidney failure Father     complete shut down  . Pulmonary embolism Mother   . Heart failure Father      ROS:  Please see the history of present illness.   She has dysphagia at times.  All other systems reviewed and negative.   PHYSICAL EXAM: VS:  BP 135/76  Pulse 67  Ht 5\' 3"  (1.6 m)  Wt 203 lb (92.08 kg)  BMI 35.96 kg/m2 Well nourished, well developed, in no acute distress HEENT: normal Neck: no JVD Vascular: no carotid bruits Cardiac:  normal S1, S2; RRR; no murmur Lungs:  clear to auscultation bilaterally, no wheezing, rhonchi or rales Abd: soft, nontender, no hepatomegaly Ext: no edema Skin: warm and dry Neuro:  CNs 2-12 intact, no focal abnormalities noted Psych: normal affect   EKG:  NSR, HR 65, no acute changes.   ASSESSMENT AND PLAN:  1.  Chest Pain Atypical symptoms. But, she has risk factors of HTN, HL, age and gender. We discussed how atypical symptoms often occur in women and how NSTEMI's do not always show up on EKGs. Her CP is different from her GERD and the CP is becoming more frequent and more severe. I have suggested admission to the hospital for observation and rule out. I also d/w Dr. Patty Sermons who agreed.   Also d/w Dr. Valera Castle (DOD) who also agreed. If she rules out for MI, she should be able to be d/c to home and plan OP stress testing. If she rules in, she will need a cardiac cath.   2.  Hypertension Controlled.  Continue current therapy.   3.  Hyperlipidemia Continue Zocor.  4.  GERD Continue Nexium. If cardiac workup neg, follow up with Dr. Marjorie Smolder.   Signed, Tereso Newcomer, PA-C  1:21 PM 05/13/2012

## 2012-05-13 NOTE — H&P (Signed)
History and Physical   Date:  05/13/2012   Name:  Theresa Holmes   DOB:  [birth year]   MRN:  [id number]  PCP:  Julian Hy, MD  Primary Cardiologist:  Dr. Cassell Clement  Primary Electrophysiologist:  None    History of Present Illness: Theresa Holmes is a 71 y.o. female who returns to the office as an acute visit for chest pain.  She has a hx of HTN, HL, GERD, hypothyroidism.  She had a negative Cardiolite in 2005.  She is s/p Nissen fundoplication and is followed by Dr. Marjorie Smolder.  She called Dr. Patty Sermons this am with complaints of CP.  She has had several episodes of CP over the last month.  These occur suddenly.  Not brought on by exertion.  Pain is getting more frequent and more severe.  She had fairly severe pain this AM.  It is not her typical GERD pain.  No pleuritic CP or pain with lying supine.  Pain is substernal and pressure like.  It lasted one hour this am.  No radiation, assoc diaphoresis or dyspnea.  She does note dyspnea with more extreme activities.  No orthopnea, PND, edema.  Just got back from [place].  Pain started before going.  She notes pain recurred shortly before coming here.  She rates it 2/10.     Potassium  Date/Time Value Range Status  02/04/2012  9:32 AM 3.8  3.5 - 5.1 mEq/L Final     Creatinine, Ser  Date/Time Value Range Status  02/04/2012  9:32 AM 0.6  0.4 - 1.2 mg/dL Final     ALT  Date/Time Value Range Status  02/04/2012  9:32 AM 13  0 - 35 U/L Final   Lab Results  Component Value Date   CHOL 154 02/04/2012   HDL 58.60 02/04/2012   LDLCALC 72 02/04/2012   TRIG 116.0 02/04/2012   CHOLHDL 3 02/04/2012    Past Medical History  Diagnosis Date  . Hypertension   . Hyperlipidemia   . Exogenous obesity   . Hypothyroidism   . Chronic back pain   . Spinal stenosis   . Hypercholesterolemia   . Thyroid mass     right, benign  . GERD (gastroesophageal reflux disease)     Past Surgical History  Procedure Date  . Abdominal hysterectomy 1985    . Tonsillectomy and adenoidectomy     as a child  . Hernia repair 2004  . Varicose vein surgery 1977  . Upper endoscopy w/ sclerotherapy 2000  . Hammer toe surgery 2002  . Cholecystectomy   . Rotator cuff repair 2001  . Nissen fundoplication   . Kidney stone surgery 2002     Current Outpatient Prescriptions  Medication Sig Dispense Refill  . aspirin 81 MG tablet Take 81 mg by mouth daily.        . celecoxib (CELEBREX) 200 MG capsule Take 200 mg by mouth 2 (two) times daily. As needed       . Cholecalciferol (VITAMIN D PO) Take by mouth. Every other week      . esomeprazole (NEXIUM) 40 MG capsule Take 40 mg by mouth daily before breakfast.        . estradiol (CLIMARA) 0.025 mg/24hr Place 1 patch onto the skin once a week.        Marland Kitchen ibuprofen (ADVIL,MOTRIN) 200 MG tablet Take 200 mg by mouth every 6 (six) hours as needed.      Marland Kitchen levothyroxine (SYNTHROID, LEVOTHROID) 150 MCG tablet Take  150 mcg by mouth daily.      Marland Kitchen losartan (COZAAR) 50 MG tablet Take 1 tablet (50 mg total) by mouth daily.  30 tablet  11  . Multiple Vitamin (MULTIVITAMIN PO) Take by mouth daily.        . multivitamin-lutein (OCUVITE-LUTEIN) CAPS Take 1 capsule by mouth daily.      . niacin 250 MG CR capsule Take 250 mg by mouth at bedtime.        Marland Kitchen omeprazole-sodium bicarbonate (ZEGERID) 40-1100 MG per capsule Take 1 capsule by mouth daily before breakfast.  30 capsule  11  . omeprazole-sodium bicarbonate (ZEGERID) 40-1100 MG per capsule Take 1 capsule by mouth daily before breakfast.  30 capsule  10  . pantoprazole (PROTONIX) 40 MG tablet Take 1 tablet (40 mg total) by mouth daily.  30 tablet  5  . simvastatin (ZOCOR) 40 MG tablet Take 1 tablet (40 mg total) by mouth at bedtime.  30 tablet  11    Allergies: No Known Allergies  History  Substance Use Topics  . Smoking status: Never Smoker   . Smokeless tobacco: Not on file  . Alcohol Use: Not on file      Family History  Problem Relation Age of Onset  .  Kidney failure Father     complete shut down  . Pulmonary embolism Mother   . Heart failure Father      ROS:  Please see the history of present illness.   She has dysphagia at times.  All other systems reviewed and negative.   PHYSICAL EXAM: VS:  BP 135/76  Pulse 67  Ht 5\' 3"  (1.6 m)  Wt 203 lb (92.08 kg)  BMI 35.96 kg/m2 Well nourished, well developed, in no acute distress HEENT: normal Neck: no JVD Vascular: no carotid bruits Cardiac:  normal S1, S2; RRR; no murmur Lungs:  clear to auscultation bilaterally, no wheezing, rhonchi or rales Abd: soft, nontender, no hepatomegaly Ext: no edema Skin: warm and dry Neuro:  CNs 2-12 intact, no focal abnormalities noted Psych: normal affect   EKG:  NSR, HR 65, no acute changes.   ASSESSMENT AND PLAN:  1.  Chest Pain Atypical symptoms. But, she has risk factors of HTN, HL, age and gender. We discussed how atypical symptoms often occur in women and how NSTEMI's do not always show up on EKGs. Her CP is different from her GERD and the CP is becoming more frequent and more severe. I have suggested admission to the hospital for observation and rule out. I also d/w Dr. Patty Sermons who agreed.   Also d/w Dr. Valera Castle (DOD) who also agreed. If she rules out for MI, she should be able to be d/c to home and plan OP stress testing. If she rules in, she will need a cardiac cath.   2.  Hypertension Controlled.  Continue current therapy.   3.  Hyperlipidemia Continue Zocor.  4.  GERD Continue Nexium. If cardiac workup neg, follow up with Dr. Marjorie Smolder.   Signed, Tereso Newcomer, PA-C  1:21 PM 05/13/2012 I have taken a history, reviewed medications, allergies, PMH, SH, FH, and reviewed ROS and examined the patient. Agree with plan to admit and rule out.  Keyosha Tiedt C. Daleen Squibb, MD, [facility] Pager:  [phone]

## 2012-05-14 DIAGNOSIS — R079 Chest pain, unspecified: Principal | ICD-10-CM

## 2012-05-14 DIAGNOSIS — I119 Hypertensive heart disease without heart failure: Secondary | ICD-10-CM

## 2012-05-14 LAB — CARDIAC PANEL(CRET KIN+CKTOT+MB+TROPI)
CK, MB: 1.9 ng/mL (ref 0.3–4.0)
Total CK: 47 U/L (ref 7–177)
Troponin I: <0.3 ng/mL (ref <0.30)

## 2012-05-14 MED ORDER — IBUPROFEN 200 MG PO TABS: 200.0000 mg | ORAL_TABLET | Freq: Four times a day (QID) | ORAL | Status: DC | PRN

## 2012-05-14 MED ORDER — CELECOXIB 200 MG PO CAPS: 200.0000 mg | ORAL_CAPSULE | Freq: Two times a day (BID) | ORAL | Status: DC

## 2012-05-14 NOTE — Discharge Instructions (Signed)
   You have a Stress Test scheduled on June 26th at [facility] in [place]. Your doctor has ordered this test to get a better idea of how your heart works.  Please arrive 15 minutes early for paperwork.   Location: [address] [place], [place] [address] [phone]  Instructions:  No food/drink after midnight the night before.   No caffeine/decaf products 24 hours before, including medicines such as Excedrin or Goody Powders. Call if there are any questions.   Wear comfortable clothes and shoes.   It is OK to take your morning meds with a sip of water EXCEPT for those types of medicines listed below or otherwise instructed.  Special Medication Instructions:  Beta blockers such as metoprolol (Lopressor/Toprol XL), atenolol (Tenormin), carvedilol (Coreg), nebivolol (Bystolic), propranolol (Inderal) should not be taken for 24 hours before the test.  Calcium channel blockers such as diltiazem (Cardizem) or verapmil (Calan) should not be taken for 24 hours before the test.  Remove nitroglycerin patches and do not take nitrate preparations such as Imdur/isosorbide the day of your test.  No Persantine/Theophylline or Aggrenox medicines should be used within 24 hours of the test.   What To Expect: The whole test will take several hours. When you arrive in the lab, the technician will inject a small amount of radioactive tracer into your arm through an IV while you are resting quietly. This helps [place] to form pictures of your heart. You will likely only feel a sting from the IV. After a waiting period, resting pictures will be obtained under a big camera. These are the "before" pictures.  Next, you will be prepped for the stress portion of the test. This may include either walking on a treadmill or receiving a medicine that helps to dilate blood vessels in your heart to simulate the effect of exercise on your heart. If you are walking on a treadmill, you will walk at  different paces to try to get your heart rate to a goal number that is based on your age. If your doctor has chosen the pharmacologic test, then you will receive a medicine through your IV that may cause temporary nausea, flushing, shortness of breath and sometimes chest discomfort or vomiting. This is typically short-lived and usually resolves quickly. Your blood pressure and heart rate will be monitored, and we will be watching your EKG on a computer screen for any changes. During this portion of the test, the radiologist will inject another small amount of radioactive tracer into your IV. After a waiting period, you will undergo a second set of pictures. These are the "after" pictures.  The doctor reading the test will compare the before-and-after images to look for evidence of heart blockages or heart weakness. In certain instances, this test is done over 2 days but usually only takes 1 day to complete.

## 2012-05-14 NOTE — Plan of Care (Signed)
Problem: Phase I Progression Outcomes Goal: MD aware of Cardiac Marker results Outcome: Progressing 2/3 have resulted.

## 2012-05-14 NOTE — Discharge Summary (Signed)
Discharge Summary   Patient ID: Theresa Holmes MRN: [id number], DOB/AGE: 1941/08/13 71 y.o. Admit date: 05/13/2012 D/C date:     05/14/2012   Primary Discharge Diagnoses:  1. Chest pain, with atypical features - for outpatient stress myoview  Secondary Discharge Diagnoses:  1. HTN 2. HL 3. GERD 4. Hypothyroidism 5. Obesity 6. Chronic back pain 7. Spinal stenosis 8. Prior hx of benign thyroid mass 9. Nissen fundoplication 10. Prior cholecystectomy  Hospital Course: 71 y/o F with hx of HTN, GERD, hypothyroidism, Nissen fundoplication presented to the office as an acute visit for chest pain. She has no hx of CAD and had a prior negative stress test in 2005. She reported several episodes of the last month, occurring suddenly, not brought on by exertion but occurring more frequently. The pain is substernal and pressure like. It lasted 1 hour yesterday. She does note dyspnea with more extreme activites. She just got back from [place] but the pain started even before going. Her symptoms were felt to be atypical but given her risk factors, she was admitted to the hospital for MI rule-out. EKG showed NSR, no acute changes. Cardiac enzymes were negative. Labwork was otherwise grossly unremarkable. She was not tachycardic, tachynic or hypoxic. CXR showed lower lung volumes with mild basilar atx. The patient is feeling better this AM. Dr. Elease Hashimoto has seen and examined her today and feels she is stable for discharge. She will have an outpatient stress myoview arranged. The patient is willing to walk on a treadmill for this. She ambulated this morning without any difficulty. .  Discharge Vitals: Blood pressure 115/73, pulse 56, temperature 97.3 F (36.3 C), temperature source Oral, resp. rate 18, height 5\' 3"  (1.6 m), weight 201 lb 1 oz (91.2 kg), SpO2 94.00%.  Labs: Lab Results  Component Value Date   WBC 8.1 05/13/2012   HGB 12.7 05/13/2012   HCT 38.1 05/13/2012   MCV 83.2 05/13/2012   PLT 294  05/13/2012     Lab 05/13/12 1510  NA 141  K 3.6  CL 101  CO2 29  BUN 27*  CREATININE 0.67  CALCIUM 9.0  PROT 7.2  BILITOT 0.9  ALKPHOS 81  ALT 9  AST 11  GLUCOSE 93    Basename 05/14/12 0232 05/13/12 2128 05/13/12 1510  CKTOTAL 47 65 70  CKMB 1.9 2.3 2.7  TROPONINI <0.30 <0.30 <0.30   Lab Results  Component Value Date   CHOL 154 02/04/2012   HDL 58.60 02/04/2012   LDLCALC 72 02/04/2012   TRIG 116.0 02/04/2012    Diagnostic Studies/Procedures   X-ray Chest Pa And Lateral 05/13/2012  *RADIOLOGY REPORT*  Clinical Data: 71 year old female with chest pain.  CHEST - 2 VIEW  Comparison: 06/05/2005.  Findings: Lower lung volumes.  Chronic eventration of the diaphragm is stable.  Chronic hiatal hernia.  Increased linear basilar opacity most resembles atelectasis.  Stable cardiac size and mediastinal contours.  Incidental breast implants.  No pneumothorax, pulmonary edema, pleural effusion or consolidation. Degenerative changes in the spine.  Lumbar hardware partially visible.  IMPRESSION: Lower lung volumes with mild basilar atelectasis.  Original Report Authenticated By: Harley Hallmark, M.D.    Discharge Medications   Medication List  As of 05/14/2012  8:24 AM   TAKE these medications         aspirin 81 MG tablet   Take 81 mg by mouth daily.      celecoxib 200 MG capsule   Commonly known as: CELEBREX  Take 1 capsule (200 mg total) by mouth 2 (two) times daily. As needed      CLIMARA 0.025 mg/24hr   Generic drug: estradiol   Place 1 patch onto the skin once a week.      esomeprazole 40 MG capsule   Commonly known as: NEXIUM   Take 40 mg by mouth daily before breakfast.      ibuprofen 200 MG tablet   Commonly known as: ADVIL,MOTRIN   Take 1 tablet (200 mg total) by mouth every 6 (six) hours as needed.      levothyroxine 137 MCG tablet   Commonly known as: SYNTHROID, LEVOTHROID   Take 137 mcg by mouth every morning.      losartan 50 MG tablet   Commonly known as:  COZAAR   Take 50 mg by mouth daily.      multivitamin with minerals Tabs   Take 1 tablet by mouth daily.      multivitamin-lutein Caps   Take 1 capsule by mouth daily.      simvastatin 20 MG tablet   Commonly known as: ZOCOR   Take 20 mg by mouth every evening.      Vitamin D (Ergocalciferol) 50000 UNITS Caps   Commonly known as: DRISDOL   Take 50,000 Units by mouth every 14 (fourteen) days.          Discussed with Dr. Elease Hashimoto - ok to continue her home Celebrex/ibuprofen for now.  Disposition   The patient will be discharged in stable condition to home. Discharge Orders    Future Appointments: Provider: Department: Dept Phone: Center:   05/20/2012 7:45 AM [facility]  None   05/27/2012 2:30 PM Cassell Clement, MD [facility] [phone] None   04/09/2013 7:30 AM Sherrie George, MD [facility] [phone] None     Future Orders Please Complete By Expires   Diet - low sodium heart healthy      Increase activity slowly        Follow-up Information    Follow up with [facility]. (Stress Test on June 26th, 2013 at 7:45am)    Contact information:   [address] [place] [address] [phone]      Follow up with Cassell Clement, MD. (July 3rd, 2013 at 2:30pm)    Contact information:   [address] [place] [address] [phone]            Duration of Discharge Encounter: Greater than 30 minutes including physician and PA time.  Signed, Ronie Spies PA-C 05/14/2012, 8:24 AM  Attending Note:   The patient was seen and examined.  Agree with assessment and plan as noted above.  See my note from earlier today   Alvia Grove., MD, [facility] 05/14/2012, 10:16 PM

## 2012-05-14 NOTE — Plan of Care (Signed)
Problem: Phase I Progression Outcomes Goal: Anginal pain relieved Outcome: Completed/Met Date Met:  05/14/12 Pt denies any pain or chest pain

## 2012-05-14 NOTE — Progress Notes (Signed)
PROGRESS NOTE  Subjective:   Theresa Holmes is a 71 yo admitted with cp.  Her cardiac enzymes are negative.  Objective:    Vital Signs:   Temp:  [97.3 F (36.3 C)-97.7 F (36.5 C)] 97.3 F (36.3 C) (06/20 0549) Pulse Rate:  [56-70] 56  (06/20 0549) Resp:  [18] 18  (06/20 0549) BP: (115-174)/(62-81) 115/73 mmHg (06/20 0549) SpO2:  [93 %-94 %] 94 % (06/20 0549) Weight:  [201 lb 1 oz (91.2 kg)-203 lb (92.08 kg)] 201 lb 1 oz (91.2 kg) (06/19 1447)  Last BM Date: 05/12/12   24-hour weight change: Weight change:   Weight trends: Filed Weights   05/13/12 1447  Weight: 201 lb 1 oz (91.2 kg)    Intake/Output:  06/19 0701 - 06/20 0700 In: 240 [P.O.:240] Out: -      Physical Exam: BP 115/73  Pulse 56  Temp 97.3 F (36.3 C) (Oral)  Resp 18  Ht 5\' 3"  (1.6 m)  Wt 201 lb 1 oz (91.2 kg)  BMI 35.62 kg/m2  SpO2 94%  General: Vital signs reviewed and noted. Well-developed, well-nourished, in no acute distress; alert, appropriate and cooperative .  Head: Normocephalic, atraumatic.  Eyes: conjunctivae/corneas clear.  EOM's intact.   Throat: normal  Neck: Supple. Normal carotids. No JVD  Lungs:  Clear to auscultation  Heart: Regular rate,  With normal  S1 S2. No murmurs, gallops or rubs  Abdomen:  Soft, non-tender, non-distended with normoactive bowel sounds. No hepatomegaly. No rebound/guarding. No abdominal masses.  Extremities: Distal pedal pulses are 2+ .  No edema.    Neurologic: A&O X3, CN II - XII are grossly intact. Motor strength is 5/5 in the all 4 extremities.  Psych: Responds to questions appropriately with normal affect.    Labs: BMET:  Basename 05/13/12 1510  NA 141  K 3.6  CL 101  CO2 29  GLUCOSE 93  BUN 27*  CREATININE 0.67  CALCIUM 9.0  MG --  PHOS --    Liver function tests:  Basename 05/13/12 1510  AST 11  ALT 9  ALKPHOS 81  BILITOT 0.9  PROT 7.2  ALBUMIN 3.8   No results found for this basename: LIPASE:2,AMYLASE:2 in the last 72  hours  CBC:  Basename 05/13/12 1510  WBC 8.1  NEUTROABS 5.1  HGB 12.7  HCT 38.1  MCV 83.2  PLT 294    Cardiac Enzymes:  Basename 05/14/12 0232 05/13/12 2128 05/13/12 1510  CKTOTAL 47 65 70  CKMB 1.9 2.3 2.7  TROPONINI <0.30 <0.30 <0.30    Coagulation Studies:  Basename 05/13/12 1510  LABPROT 13.0  INR 0.96    Other: No components found with this basename: POCBNP:3 No results found for this basename: DDIMER in the last 72 hours No results found for this basename: HGBA1C in the last 72 hours No results found for this basename: CHOL,HDL,LDLCALC,TRIG,CHOLHDL in the last 72 hours No results found for this basename: TSH,T4TOTAL,FREET3,T3FREE,THYROIDAB in the last 72 hours No results found for this basename: VITAMINB12,FOLATE,FERRITIN,TIBC,IRON,RETICCTPCT in the last 72 hours   Tele 6/20:  NSR at 65  Medications:    Infusions:    Scheduled Medications:    . aspirin  324 mg Oral NOW   Or  . aspirin  300 mg Rectal NOW  . aspirin EC  81 mg Oral Daily  . celecoxib  200 mg Oral BID  . enoxaparin  40 mg Subcutaneous Q24H  . estradiol  0.025 mg Transdermal Weekly  . levothyroxine  150  mcg Oral Daily  . losartan  50 mg Oral Daily  . multivitamin-lutein  1 capsule Oral Daily  . niacin  250 mg Oral QHS  . pantoprazole  80 mg Oral Q1200  . simvastatin  40 mg Oral QHS  . sodium chloride  3 mL Intravenous Q12H  . DISCONTD: aspirin  81 mg Oral Daily    Assessment/ Plan:    1. Chest pain:  Enzymes are negative.  She has had a negative stress myoview in the past.  Will arrange repeat stress myoview as op.  ECG is normal. Tiny insignificant inferior Q waves.  Ambulate in halls this am prior to discharge. Home on same meds.  Disposition: home today Length of Stay: 1  Theresa Holmes, Montez Hageman., MD, [facility] 05/14/2012, 7:28 AM Office [phone] Pager [phone]

## 2012-05-14 NOTE — Progress Notes (Signed)
Pt ambulated in hallway independently no complaints of chest pain or shortness of breath will monitor patient. Theresa Holmes, Randall An RN

## 2012-05-20 ENCOUNTER — Ambulatory Visit (HOSPITAL_COMMUNITY): Payer: PRIVATE HEALTH INSURANCE | Attending: Internal Medicine | Admitting: Radiology

## 2012-05-20 VITALS — BP 118/70 | Ht 63.0 in | Wt 201.0 lb

## 2012-05-20 DIAGNOSIS — I10 Essential (primary) hypertension: Secondary | ICD-10-CM | POA: Insufficient documentation

## 2012-05-20 DIAGNOSIS — R079 Chest pain, unspecified: Secondary | ICD-10-CM | POA: Insufficient documentation

## 2012-05-20 DIAGNOSIS — R110 Nausea: Secondary | ICD-10-CM | POA: Insufficient documentation

## 2012-05-20 DIAGNOSIS — R0602 Shortness of breath: Secondary | ICD-10-CM

## 2012-05-20 DIAGNOSIS — E785 Hyperlipidemia, unspecified: Secondary | ICD-10-CM | POA: Insufficient documentation

## 2012-05-20 DIAGNOSIS — R0789 Other chest pain: Secondary | ICD-10-CM

## 2012-05-20 MED ORDER — TECHNETIUM TC 99M TETROFOSMIN IV KIT
11.0000 | PACK | Freq: Once | INTRAVENOUS | Status: AC | PRN
  Administered 2012-05-20: 11 via INTRAVENOUS

## 2012-05-20 MED ORDER — TECHNETIUM TC 99M TETROFOSMIN IV KIT
33.0000 | PACK | Freq: Once | INTRAVENOUS | Status: AC | PRN
  Administered 2012-05-20: 33 via INTRAVENOUS

## 2012-05-20 NOTE — Progress Notes (Signed)
[facility] SITE 3 NUCLEAR MED [address] [place] [address] [phone]  Cardiology Nuclear Med Study  Theresa Holmes is a 71 y.o. female     MRN : [id number]     DOB: 12/21/1940  Procedure Date: 05/20/2012  Nuclear Med Background Indication for Stress Test:  Evaluation for Ischemia, and Patient seen in hospital on 05/13/12 for CP, Enzymes negative History:  8/05: MPS: NL EF: 71% Cardiac Risk Factors: Hypertension and Lipids  Symptoms:  Chest Pain, Chest Pressure.  (last date of chest discomfort 05/13/12) and Nausea   Nuclear Pre-Procedure Caffeine/Decaff Intake:  None > 12 hrs NPO After: 7:00pm   Lungs:  clear O2 Sat: 95% on room air. IV 0.9% NS with Angio Cath:  20g  IV Site: R Antecubital x 1, tolerated well IV Started by:  Irean Hong, RN  Chest Size (in):  42 Cup Size: D, Implants  Height: 5\' 3"  (1.6 m)  Weight:  201 lb (91.173 kg)  BMI:  Body mass index is 35.61 kg/(m^2). Tech Comments:  n/a    Nuclear Med Study 1 or 2 day study: 1 day  Stress Test Type:  Stress  Reading MD: Arvilla Meres, MD  Order Authorizing Provider:  Cassell Clement, MD, and Tereso Newcomer, [facility]  Resting Radionuclide: Technetium 81m Tetrofosmin  Resting Radionuclide Dose: 10.7 mCi   Stress Radionuclide:  Technetium 51m Tetrofosmin  Stress Radionuclide Dose: 32.6 mCi           Stress Protocol Rest HR: 67 Stress HR: 150  Rest BP: 118/70 Stress BP: 148/64  Exercise Time (min): 5:01 METS: 7.0   Predicted Max HR: 150 bpm % Max HR: 100 bpm Rate Pressure Product: 40347   Dose of Adenosine (mg):  n/a Dose of Lexiscan: n/a mg  Dose of Atropine (mg): n/a Dose of Dobutamine: n/a mcg/kg/min (at max HR)  Stress Test Technologist: Milana Na, EMT-P  Nuclear Technologist:  Doyne Keel, CNMT     Rest Procedure:  Myocardial perfusion imaging was performed at rest 45 minutes following the intravenous administration of Technetium 71m Tetrofosmin. Rest ECG: NSR - Normal  EKG  Stress Procedure:  The patient performed treadmill exercise using a Bruce  Protocol for 5:01 minutes. The patient stopped due to fatigue and denied any chest pain.  There were no significant ST-T wave changes.  Technetium 57m Tetrofosmin was injected at peak exercise and myocardial perfusion imaging was performed after a brief delay. Stress ECG: No significant change from baseline ECG  QPS Raw Data Images:  Normal; no motion artifact; normal heart/lung ratio. Stress Images:  Normal homogeneous uptake in all areas of the myocardium. Rest Images:  Normal homogeneous uptake in all areas of the myocardium. Subtraction (SDS):  Normal Transient Ischemic Dilatation (Normal <1.22):  0.72 Lung/Heart Ratio (Normal <0.45):  0.22  Quantitative Gated Spect Images QGS EDV:  78 ml QGS ESV:  19 ml  Impression Exercise Capacity:  Fair exercise capacity. BP Response:  Normal blood pressure response. Clinical Symptoms:  No significant symptoms noted. ECG Impression:  No significant ST segment change suggestive of ischemia. Comparison with Prior Nuclear Study: No images to compare  Overall Impression:  Normal stress nuclear study.  LV Ejection Fraction: 75%.  LV Wall Motion:  NL LV Function; NL Wall Motion  Truman Hayward 6:43 PM

## 2012-05-21 ENCOUNTER — Telehealth: Payer: Self-pay | Admitting: *Deleted

## 2012-05-21 NOTE — Telephone Encounter (Signed)
per Bing Neighbors. PAC to call pt to make sure she knows to have f/u appt w/fasting labs in 6 months, looks like Dr. Patty Sermons already called, I apologized if this was a duplicated call.

## 2012-05-21 NOTE — Telephone Encounter (Signed)
Message copied by Tarri Fuller on Thu May 21, 2012  3:14 PM ------      Message from: [place], [place] T      Created: Thu May 21, 2012  2:04 PM       As noted by Dr. Cassell Clement      Please notify patient      Tereso Newcomer, PA-C  2:04 PM 05/21/2012

## 2012-05-27 ENCOUNTER — Encounter: Payer: PRIVATE HEALTH INSURANCE | Admitting: Cardiology

## 2012-07-24 ENCOUNTER — Other Ambulatory Visit: Payer: Self-pay | Admitting: Gastroenterology

## 2012-09-14 ENCOUNTER — Encounter: Payer: Self-pay | Admitting: Internal Medicine

## 2012-09-24 ENCOUNTER — Other Ambulatory Visit: Payer: Self-pay | Admitting: Cardiology

## 2012-11-25 DIAGNOSIS — B029 Zoster without complications: Secondary | ICD-10-CM

## 2012-11-25 HISTORY — DX: Zoster without complications: B02.9

## 2013-04-09 ENCOUNTER — Ambulatory Visit (INDEPENDENT_AMBULATORY_CARE_PROVIDER_SITE_OTHER): Payer: Medicare Other | Admitting: Ophthalmology

## 2013-04-12 ENCOUNTER — Ambulatory Visit (INDEPENDENT_AMBULATORY_CARE_PROVIDER_SITE_OTHER): Payer: No Typology Code available for payment source | Admitting: Ophthalmology

## 2013-04-12 DIAGNOSIS — I10 Essential (primary) hypertension: Secondary | ICD-10-CM

## 2013-04-12 DIAGNOSIS — H43819 Vitreous degeneration, unspecified eye: Secondary | ICD-10-CM

## 2013-04-12 DIAGNOSIS — H35039 Hypertensive retinopathy, unspecified eye: Secondary | ICD-10-CM

## 2013-04-12 DIAGNOSIS — H2510 Age-related nuclear cataract, unspecified eye: Secondary | ICD-10-CM

## 2013-04-12 DIAGNOSIS — H33309 Unspecified retinal break, unspecified eye: Secondary | ICD-10-CM

## 2013-07-23 ENCOUNTER — Other Ambulatory Visit: Payer: Self-pay | Admitting: Obstetrics and Gynecology

## 2013-07-23 NOTE — Telephone Encounter (Signed)
AEX scheduled for 08/05/13 #4/0 rf's sent through to last pt.

## 2013-08-04 ENCOUNTER — Ambulatory Visit: Payer: Self-pay | Admitting: Obstetrics and Gynecology

## 2013-08-05 ENCOUNTER — Ambulatory Visit (INDEPENDENT_AMBULATORY_CARE_PROVIDER_SITE_OTHER): Payer: PRIVATE HEALTH INSURANCE | Admitting: Obstetrics & Gynecology

## 2013-08-05 ENCOUNTER — Encounter: Payer: Self-pay | Admitting: Obstetrics & Gynecology

## 2013-08-05 VITALS — BP 118/80 | HR 68 | Resp 16 | Ht 61.5 in | Wt 193.0 lb

## 2013-08-05 DIAGNOSIS — Z01419 Encounter for gynecological examination (general) (routine) without abnormal findings: Secondary | ICD-10-CM

## 2013-08-05 DIAGNOSIS — R1900 Intra-abdominal and pelvic swelling, mass and lump, unspecified site: Secondary | ICD-10-CM

## 2013-08-05 DIAGNOSIS — Z0000 Encounter for general adult medical examination without abnormal findings: Secondary | ICD-10-CM

## 2013-08-05 MED ORDER — ESTRADIOL 0.025 MG/24HR TD PTWK: 1.0000 | MEDICATED_PATCH | TRANSDERMAL | Status: DC

## 2013-08-05 NOTE — Patient Instructions (Signed)

## 2013-08-05 NOTE — Progress Notes (Signed)
72 y.o. G3P3 MarriedCaucasianF here for annual exam.  Long term patient of Dr. Harlene Salts.  Has shingles last year.  Doing well now.    Patient's last menstrual period was 11/26/1983.          Sexually active: yes  The current method of family planning is status post hysterectomy.    Exercising: yes  stationary bike Smoker:  no  Health Maintenance: Pap:  08/04/00 WNL History of abnormal Pap:  no MMG:  05/25/13 3D normal Colonoscopy:  2013, told not needed, Dr. Matthias Hughs BMD:   05/25/13 normal TDaP:  2012 Screening Labs: PCP, Hb today: PCP, Urine today: PCP   reports that she has never smoked. She has never used smokeless tobacco. She reports that she does not drink alcohol or use illicit drugs.  Past Medical History  Diagnosis Date  . Hypertension   . Hyperlipidemia   . Exogenous obesity   . Hypothyroidism   . Chronic back pain   . Spinal stenosis   . Hypercholesterolemia   . Thyroid mass     right, benign  . GERD (gastroesophageal reflux disease)   . H/O hiatal hernia   . Arthritis     Past Surgical History  Procedure Laterality Date  . Abdominal hysterectomy  1985  . Tonsillectomy and adenoidectomy      as a child  . Hernia repair  2004  . Varicose vein surgery  1977  . Upper endoscopy w/ sclerotherapy  2000  . Hammer toe surgery  2002  . Cholecystectomy    . Rotator cuff repair  2001  . Nissen fundoplication    . Kidney stone surgery  2002  . Thyroidectomy    . Spinal fusion  2005    L4  & L5  . Breast biopsy  9/05    stereotactic  . Combined augmentation mammaplasty and abdominoplasty  1977  . Eye surgery  12/05    retinal tear-left  . Rotator cuff repair  1/12     left  . Rotator cuff repair  5/13     open    Current Outpatient Prescriptions  Medication Sig Dispense Refill  . aspirin 81 MG tablet Take 81 mg by mouth. Two daily      . esomeprazole (NEXIUM) 40 MG capsule Take 40 mg by mouth daily before breakfast.        . estradiol (CLIMARA) 0.025 mg/24hr  Place 1 patch onto the skin once a week.        Marland Kitchen ibuprofen (ADVIL,MOTRIN) 200 MG tablet Take 1 tablet (200 mg total) by mouth every 6 (six) hours as needed.      Marland Kitchen levothyroxine (SYNTHROID, LEVOTHROID) 137 MCG tablet Take 137 mcg by mouth every morning.      Marland Kitchen losartan (COZAAR) 50 MG tablet Take 50 mg by mouth daily.      . Multiple Vitamin (MULTIVITAMIN WITH MINERALS) TABS Take 1 tablet by mouth daily.      . multivitamin-lutein (OCUVITE-LUTEIN) CAPS Take 1 capsule by mouth daily.      . Vitamin D, Ergocalciferol, (DRISDOL) 50000 UNITS CAPS capsule       . celecoxib (CELEBREX) 200 MG capsule Take 1 capsule (200 mg total) by mouth 2 (two) times daily. As needed       No current facility-administered medications for this visit.    Family History  Problem Relation Age of Onset  . Kidney failure Father     complete shut down  . Pulmonary embolism Mother   .  Heart failure Father   . Breast cancer Maternal Grandmother 80  . Pulmonary embolism Mother 57    post partum    ROS:  Pertinent items are noted in HPI.  Otherwise, a comprehensive ROS was negative.  Exam:   BP 118/80  Pulse 68  Resp 16  Ht 5' 1.5" (1.562 m)  Wt 193 lb (87.544 kg)  BMI 35.88 kg/m2  LMP 11/26/1983  Weight change: +3lb  Height: 5' 1.5" (156.2 cm)  Ht Readings from Last 3 Encounters:  08/05/13 5' 1.5" (1.562 m)  05/20/12 5\' 3"  (1.6 m)  05/13/12 5\' 3"  (1.6 m)    General appearance: alert, cooperative and appears stated age Head: Normocephalic, without obvious abnormality, atraumatic Neck: no adenopathy, supple, symmetrical, trachea midline and thyroid normal to inspection and palpation Lungs: clear to auscultation bilaterally Breasts: normal appearance, no masses or tenderness Heart: regular rate and rhythm Abdomen: soft, non-tender; bowel sounds normal; no masses,  no organomegaly Extremities: extremities normal, atraumatic, no cyanosis or edema Skin: Skin color, texture, turgor normal. No rashes or  lesions Lymph nodes: Cervical, supraclavicular, and axillary nodes normal. No abnormal inguinal nodes palpated Neurologic: Grossly normal   Pelvic: External genitalia:  no lesions              Urethra:  normal appearing urethra with no masses, tenderness or lesions              Bartholins and Skenes: normal                 Vagina: normal appearing vagina with normal color and discharge, no lesions              Cervix: absent              Pap taken: no Bimanual Exam:  Uterus:  uterus absent              Adnexa: Fullness and irregularity along the cuff, possible constipation               Rectovaginal: Confirms               Anus:  normal sphincter tone, no lesions  A:  Well Woman with normal exam PMP, on Low dose HRT Low Vit D H/O hemothyroidectomy Elevated lipids  P:   Mammogram yearly pap smear not indicated Vit D today Return for PUS to assess pelvic fullness/possible stool Rx for climara 0.025mg  patches weekly to pharmacy. return annually or prn  An After Visit Summary was printed and given to the patient.

## 2013-08-06 ENCOUNTER — Telehealth: Payer: Self-pay

## 2013-08-06 LAB — VITAMIN D 25 HYDROXY (VIT D DEFICIENCY, FRACTURES): Vit D, 25-Hydroxy: 57 ng/mL (ref 30–89)

## 2013-08-06 NOTE — Telephone Encounter (Signed)
Message copied by Elisha Headland on Fri Aug 06, 2013  3:49 PM ------      Message from: Jerene Bears      Created: Fri Aug 06, 2013  6:14 AM       Inform level good.  She is on the 50000 Rx dose.  She can take one a month or just change to daily 2000 IU.  If she wants RX, please order. ------

## 2013-08-06 NOTE — Telephone Encounter (Signed)
lmtcb

## 2013-08-09 ENCOUNTER — Other Ambulatory Visit: Payer: Self-pay

## 2013-08-09 NOTE — Telephone Encounter (Signed)
Patient notified of Vitamin D results and would like to continue taking the Rx. Aware will send Rx to pharmacy.

## 2013-08-10 MED ORDER — VITAMIN D (ERGOCALCIFEROL) 1.25 MG (50000 UNIT) PO CAPS: 50000.0000 [IU] | ORAL_CAPSULE | ORAL | Status: DC

## 2013-08-12 ENCOUNTER — Encounter: Payer: Self-pay | Admitting: Obstetrics & Gynecology

## 2013-08-12 ENCOUNTER — Ambulatory Visit (INDEPENDENT_AMBULATORY_CARE_PROVIDER_SITE_OTHER): Payer: PRIVATE HEALTH INSURANCE

## 2013-08-12 ENCOUNTER — Ambulatory Visit (INDEPENDENT_AMBULATORY_CARE_PROVIDER_SITE_OTHER): Payer: PRIVATE HEALTH INSURANCE | Admitting: Obstetrics & Gynecology

## 2013-08-12 DIAGNOSIS — R1900 Intra-abdominal and pelvic swelling, mass and lump, unspecified site: Secondary | ICD-10-CM

## 2013-08-12 DIAGNOSIS — N83209 Unspecified ovarian cyst, unspecified side: Secondary | ICD-10-CM

## 2013-08-12 NOTE — Patient Instructions (Signed)
Please call with any new problems/issues. 

## 2013-08-12 NOTE — Progress Notes (Signed)
72 y.o.Marriedfemale here for a pelvic ultrasound.  Possible pelvic mass vs stool felt on physical exam.    Patient's last menstrual period was 11/26/1983.  Sexually active:  yes  Contraception: Hysterectomy  FINDINGS: UTERUS: surgically absent ADNEXA:   Left ovary 2.0 x 1.0 x 1.5cm with 5 x 4 mm thickwalled but avascular cyst in middle of ovary   Right ovary 2.5 x 1.1 x 1.7cm CUL DE SAC: no free fluid  Images reviewed and results discussed with pt.  Due to size of cyst and avascular nature, do not feel f/u necessary at this time  Assessment:  Ovarian follicle/cyst Plan: Recheck at [facility] with physical exam

## 2013-08-20 ENCOUNTER — Encounter: Payer: Self-pay | Admitting: Cardiology

## 2013-08-25 ENCOUNTER — Other Ambulatory Visit: Payer: Self-pay | Admitting: Obstetrics and Gynecology

## 2013-08-25 NOTE — Telephone Encounter (Signed)
eScribe request for refill on Vitamin D and Climara patch.  Both of these prescriptions were filled x 1 year in September 2014.  RX's denied.

## 2013-09-22 ENCOUNTER — Other Ambulatory Visit: Payer: Self-pay

## 2013-09-22 MED ORDER — LOSARTAN POTASSIUM 50 MG PO TABS: 50.0000 mg | ORAL_TABLET | Freq: Every day | ORAL | Status: DC

## 2013-11-16 ENCOUNTER — Other Ambulatory Visit: Payer: Self-pay | Admitting: Gastroenterology

## 2013-11-16 DIAGNOSIS — IMO0001 Reserved for inherently not codable concepts without codable children: Secondary | ICD-10-CM

## 2013-11-16 DIAGNOSIS — K469 Unspecified abdominal hernia without obstruction or gangrene: Secondary | ICD-10-CM

## 2013-11-24 ENCOUNTER — Ambulatory Visit
Admission: RE | Admit: 2013-11-24 | Discharge: 2013-11-24 | Disposition: A | Discharge status: Home / Self Care | Payer: PRIVATE HEALTH INSURANCE | Source: Ambulatory Visit | Attending: Gastroenterology | Admitting: Gastroenterology

## 2013-11-24 DIAGNOSIS — IMO0001 Reserved for inherently not codable concepts without codable children: Secondary | ICD-10-CM

## 2013-11-24 DIAGNOSIS — K469 Unspecified abdominal hernia without obstruction or gangrene: Secondary | ICD-10-CM

## 2013-12-08 ENCOUNTER — Other Ambulatory Visit: Payer: Self-pay | Admitting: Gastroenterology

## 2013-12-15 ENCOUNTER — Ambulatory Visit (INDEPENDENT_AMBULATORY_CARE_PROVIDER_SITE_OTHER): Payer: PRIVATE HEALTH INSURANCE | Admitting: Surgery

## 2013-12-15 ENCOUNTER — Encounter (INDEPENDENT_AMBULATORY_CARE_PROVIDER_SITE_OTHER): Payer: Self-pay | Admitting: Surgery

## 2013-12-15 VITALS — BP 126/84 | HR 76 | Temp 98.2°F | Resp 16 | Ht 62.0 in | Wt 204.4 lb

## 2013-12-15 DIAGNOSIS — K449 Diaphragmatic hernia without obstruction or gangrene: Secondary | ICD-10-CM

## 2013-12-15 NOTE — Patient Instructions (Signed)
Nissen Fundoplication Care After Please read the instructions outlined below and refer to this sheet for the next few weeks. These discharge instructions provide you with general information on caring for yourself after you leave the hospital. Your doctor may also give you specific instructions. While your treatment has been planned according to the most current medical practices available, unavoidable complications sometimes happen. If you have any problems or questions after discharge, please call your doctor. ACTIVITY  Take frequent rest periods throughout the day.  Take frequent walks throughout the day. This will help to prevent blood clots.  Continue to do your coughing and deep breathing exercises once you get home. This will help to prevent pneumonia.  No strenuous activities such as heavy lifting, pushing or pulling until after your follow-up visit with your doctor. Do not lift anything heavier than 10 pounds.  Talk with your caregiver about when you may return to work and your exercise routine.  You may shower 2 days after surgery. Pat incisions dry. Do not rub incisions with washcloth or towel.  Do not drive while taking prescription pain medication. NUTRITION  Continue with a liquid diet, or the diet you were directed to take, until your first follow-up visit with your surgeon.  Drink fluids (6-8 glasses a day).  Call your caregiver for persistent nausea (feeling sick to your stomach), vomiting, bloating or difficulty swallowing. ELIMINATION It is very important not to strain during bowel movements. If constipation should occur, you may:  Take a mild laxative (such as Milk of Magnesia).  Add fruit and bran to your diet.  Drink more fluids.  Call your caregiver if constipation is not relieved. FEVER If you feel feverish or have shaking chills, take your temperature. If it is 102 F (38.9 C) or above, call your caregiver. The fever may mean there is an infection. PAIN  CONTROL  If a prescription was given for a pain reliever, please follow your caregiver's directions.  Only take over-the-counter or prescription medicines for pain, discomfort, or fever as directed by your caregiver.  If the pain is not relieved by your medicine, becomes worse, or you have difficulty breathing, call your doctor. INCISION  It is normal for your cuts (incisions) from surgery to have a small amount of drainage for the first 1-2 days. Once the drainage has stopped, leave your incision(s) open to air.  Check your incision(s) and surrounding area daily for any redness, swelling, increased drainage or bleeding. If any of these are present or if the wound edges start to separate, call your doctor.  If you have small adhesive strips in place, they will peel and fall off. (If these strips are covered with a clear bandage, your doctor will tell you when to remove them.)  If you have staples, your caregiver will remove them at the follow-up appointment. Document Released: 07/04/2004 Document Revised: 02/03/2012 Document Reviewed: 10/08/2007 Exit[employer] Patient Information 2014 [employer].  

## 2013-12-15 NOTE — Progress Notes (Signed)
Chief Complaint:  Recurrent hiatus hernia with dysphagia and slow emptying.    History of Present Illness:  Theresa Holmes is an 73 y.o. female on whom I repaired a hiatal hernia 10 years ago. She did well but recently his started having problems with recurrent reflux and having to sit up at night in a chair. Her upper GI was reviewed and she appears she has some herniation above the wrap. Dr. Cristina Gong scoped her and described a 10 cm hiatal hernia.   I discussed repair with her and her on and it is more complex. Interestingly  she has lost about 4 inches in height since I did her before 10 years ago.  Unfortunately her office record has been destroyed. She was to proceed with scheduling will do that  Past Medical History  Diagnosis Date  . Hypertension   . Hyperlipidemia   . Exogenous obesity   . Hypothyroidism   . Chronic back pain   . Spinal stenosis   . Hypercholesterolemia   . Thyroid mass     right, benign  . GERD (gastroesophageal reflux disease)   . H/O hiatal hernia   . Arthritis   . Shingles 11/2012    Past Surgical History  Procedure Laterality Date  . Abdominal hysterectomy  1985  . Tonsillectomy and adenoidectomy      as a child  . Hernia repair  2004  . Varicose vein surgery  1977  . Upper endoscopy w/ sclerotherapy  2000  . Hammer toe surgery  2002  . Cholecystectomy    . Rotator cuff repair  2001  . Nissen fundoplication    . Kidney stone surgery  2002  . Thyroidectomy    . Spinal fusion  2005    L4  & L5  . Breast biopsy  9/05    stereotactic  . Combined augmentation mammaplasty and abdominoplasty  1977  . Eye surgery  12/05    retinal tear-left  . Rotator cuff repair  1/12     left  . Rotator cuff repair  5/13     open    Current Outpatient Prescriptions  Medication Sig Dispense Refill  . aspirin 81 MG tablet Take 81 mg by mouth. Two daily      . celecoxib (CELEBREX) 200 MG capsule Take 1 capsule (200 mg total) by mouth 2 (two) times daily. As needed       . esomeprazole (NEXIUM) 40 MG capsule Take 40 mg by mouth daily before breakfast.        . estradiol (CLIMARA) 0.025 mg/24hr patch Place 1 patch (0.025 mg total) onto the skin once a week.  4 patch  13  . ibuprofen (ADVIL,MOTRIN) 200 MG tablet Take 1 tablet (200 mg total) by mouth every 6 (six) hours as needed.      Marland Kitchen levothyroxine (SYNTHROID, LEVOTHROID) 137 MCG tablet Take 137 mcg by mouth every morning.      Marland Kitchen losartan (COZAAR) 50 MG tablet Take 1 tablet (50 mg total) by mouth daily.  30 tablet  2  . Multiple Vitamin (MULTIVITAMIN WITH MINERALS) TABS Take 1 tablet by mouth daily.      . multivitamin-lutein (OCUVITE-LUTEIN) CAPS Take 1 capsule by mouth daily.      . Vitamin D, Ergocalciferol, (DRISDOL) 50000 UNITS CAPS capsule Take 1 capsule (50,000 Units total) by mouth every 30 (thirty) days.  23 capsule  0   No current facility-administered medications for this visit.   Review of patient's allergies indicates no known  allergies. Family History  Problem Relation Age of Onset  . Kidney failure Father     complete shut down  . Pulmonary embolism Mother   . Heart failure Father   . Breast cancer Maternal Grandmother 80  . Pulmonary embolism Mother 57    post partum   Social History:   reports that she has never smoked. She has never used smokeless tobacco. She reports that she does not drink alcohol or use illicit drugs.   REVIEW OF SYSTEMS - PERTINENT POSITIVES ONLY: No history of DVT. Health otherwise been good.  Physical Exam:   Blood pressure 126/84, pulse 76, temperature 98.2 F (36.8 C), temperature source Temporal, resp. rate 16, height 5\' 2"  (1.575 m), weight 204 lb 6.4 oz (92.715 kg), last menstrual period 11/26/1983. Body mass index is 37.38 kg/(m^2).  Gen:  WDWN white female NAD  Neurological: Alert and oriented to person, place, and time. Motor and sensory function is grossly intact  Head: Normocephalic and atraumatic.  Eyes: Conjunctivae are normal. Pupils are  equal, round, and reactive to light. No scleral icterus.  Neck: Normal range of motion. Neck supple. No tracheal deviation or thyromegaly present.  Cardiovascular:  SR without murmurs or gallops.  No carotid bruits Respiratory: Effort normal.  No respiratory distress. No chest wall tenderness. Breath sounds normal.  No wheezes, rales or rhonchi.  Abdomen:  nontender GU: Musculoskeletal: Normal range of motion. Extremities are nontender. No cyanosis, edema or clubbing noted Lymphadenopathy: No cervical, preauricular, postauricular or axillary adenopathy is present Skin: Skin is warm and dry. No rash noted. No diaphoresis. No erythema. No pallor. Pscyh: Normal mood and affect. Behavior is normal. Judgment and thought content normal.   LABORATORY RESULTS: No results found for this or any previous visit (from the past 48 hour(s)).  RADIOLOGY RESULTS: No results found.  Problem List: Patient Active Problem List   Diagnosis Date Noted  . Recurrent hiatus hernia 12/15/2013  . Chest pain 05/13/2012  . GERD (gastroesophageal reflux disease) 02/04/2012  . Benign hypertensive heart disease without heart failure 10/02/2011  . Pure hypercholesterolemia 10/02/2011  . Headache 10/02/2011    Assessment & Plan: Recurrent hiatal hernia and with GERD and dysphagia. Plan redo laparoscopic hiatal hernia repair and Nissen.    Matt B. Hassell Done, MD, [facility] [phone] beeper [phone]  12/15/2013 12:14 PM

## 2013-12-22 ENCOUNTER — Ambulatory Visit
Admission: RE | Admit: 2013-12-22 | Discharge: 2013-12-22 | Disposition: A | Discharge status: Home / Self Care | Payer: PRIVATE HEALTH INSURANCE | Source: Ambulatory Visit | Attending: Orthopedic Surgery | Admitting: Orthopedic Surgery

## 2013-12-22 ENCOUNTER — Other Ambulatory Visit: Payer: Self-pay | Admitting: Orthopedic Surgery

## 2013-12-22 DIAGNOSIS — R059 Cough, unspecified: Secondary | ICD-10-CM

## 2013-12-22 DIAGNOSIS — R05 Cough: Secondary | ICD-10-CM

## 2013-12-27 ENCOUNTER — Other Ambulatory Visit: Payer: Self-pay | Admitting: Cardiology

## 2013-12-29 ENCOUNTER — Encounter (INDEPENDENT_AMBULATORY_CARE_PROVIDER_SITE_OTHER): Payer: Self-pay | Admitting: Gastroenterology

## 2014-01-03 ENCOUNTER — Encounter (INDEPENDENT_AMBULATORY_CARE_PROVIDER_SITE_OTHER): Payer: Self-pay

## 2014-01-05 ENCOUNTER — Encounter (HOSPITAL_COMMUNITY): Payer: Self-pay | Admitting: Pharmacy Technician

## 2014-01-12 NOTE — Progress Notes (Signed)
Please put orders in Epic surgery 01-21-14 pre op visit 01-17-14 Thank you!

## 2014-01-17 ENCOUNTER — Encounter (HOSPITAL_COMMUNITY)
Admission: RE | Admit: 2014-01-17 | Discharge: 2014-01-17 | Disposition: A | Discharge status: Home / Self Care | Payer: PRIVATE HEALTH INSURANCE | Source: Ambulatory Visit | Attending: Surgery | Admitting: Surgery

## 2014-01-17 ENCOUNTER — Encounter (HOSPITAL_COMMUNITY): Payer: Self-pay

## 2014-01-17 DIAGNOSIS — Z01810 Encounter for preprocedural cardiovascular examination: Secondary | ICD-10-CM | POA: Insufficient documentation

## 2014-01-17 DIAGNOSIS — Z01812 Encounter for preprocedural laboratory examination: Secondary | ICD-10-CM | POA: Insufficient documentation

## 2014-01-17 LAB — COMPREHENSIVE METABOLIC PANEL
ALBUMIN: 3.6 g/dL (ref 3.5–5.2)
ALT: 15 U/L (ref 0–35)
AST: 17 U/L (ref 0–37)
Alkaline Phosphatase: 89 U/L (ref 39–117)
BUN: 17 mg/dL (ref 6–23)
CO2: 28 mEq/L (ref 19–32)
Calcium: 9.3 mg/dL (ref 8.4–10.5)
Chloride: 103 mEq/L (ref 96–112)
Creatinine, Ser: 0.68 mg/dL (ref 0.50–1.10)
GFR calc Af Amer: >90 mL/min (ref >90)
GFR, EST NON AFRICAN AMERICAN: 85 mL/min — AB (ref >90)
Glucose, Bld: 97 mg/dL (ref 70–99)
Potassium: 4 mEq/L (ref 3.7–5.3)
SODIUM: 141 meq/L (ref 137–147)
TOTAL PROTEIN: 7.1 g/dL (ref 6.0–8.3)
Total Bilirubin: 1 mg/dL (ref 0.3–1.2)

## 2014-01-17 LAB — CBC
HEMATOCRIT: 40 % (ref 36.0–46.0)
HEMOGLOBIN: 12.8 g/dL (ref 12.0–15.0)
MCH: 28 pg (ref 26.0–34.0)
MCHC: 32 g/dL (ref 30.0–36.0)
MCV: 87.5 fL (ref 78.0–100.0)
Platelets: 241 10*3/uL (ref 150–400)
RBC: 4.57 MIL/uL (ref 3.87–5.11)
RDW: 14.4 % (ref 11.5–15.5)
WBC: 6.7 10*3/uL (ref 4.0–10.5)

## 2014-01-17 LAB — DIFFERENTIAL
BASOS ABS: 0 10*3/uL (ref 0.0–0.1)
BASOS PCT: 0 % (ref 0–1)
EOS ABS: 0.1 10*3/uL (ref 0.0–0.7)
Eosinophils Relative: 2 % (ref 0–5)
LYMPHS PCT: 27 % (ref 12–46)
Lymphs Abs: 1.8 10*3/uL (ref 0.7–4.0)
MONO ABS: 0.6 10*3/uL (ref 0.1–1.0)
Monocytes Relative: 10 % (ref 3–12)
Neutro Abs: 4.1 10*3/uL (ref 1.7–7.7)
Neutrophils Relative %: 62 % (ref 43–77)

## 2014-01-17 NOTE — Pre-Procedure Instructions (Signed)
CXR REPORT IN EPIC FROM 12-22-13. EKG WAS DONE TODAY - PREOP AT [facility].

## 2014-01-17 NOTE — Patient Instructions (Signed)
   YOUR SURGERY IS SCHEDULED AT [facility]  ON:  Friday  2/27  REPORT TO  SHORT STAY CENTER AT:  5:30 AM      PHONE # FOR SHORT STAY IS [phone]  DO NOT EAT OR DRINK ANYTHING AFTER MIDNIGHT THE NIGHT BEFORE YOUR SURGERY.  YOU MAY BRUSH YOUR TEETH, RINSE OUT YOUR MOUTH--BUT NO WATER, NO FOOD, NO CHEWING GUM, NO MINTS, NO CANDIES, NO CHEWING TOBACCO.  PLEASE TAKE THE FOLLOWING MEDICATIONS THE AM OF YOUR SURGERY WITH A FEW SIPS OF WATER:  [place], SYNTHROID.  MAY WEAR HORMONE PATCH   DO NOT BRING VALUABLES, MONEY, CREDIT CARDS.  DO NOT WEAR JEWELRY, MAKE-UP, NAIL POLISH AND NO METAL PINS OR CLIPS IN YOUR HAIR. CONTACT LENS, DENTURES / PARTIALS, GLASSES SHOULD NOT BE WORN TO SURGERY AND IN MOST CASES-HEARING AIDS WILL NEED TO BE REMOVED.  BRING YOUR GLASSES CASE, ANY EQUIPMENT NEEDED FOR YOUR CONTACT LENS. FOR PATIENTS ADMITTED TO THE HOSPITAL--CHECK OUT TIME THE DAY OF DISCHARGE IS 11:00 AM.  ALL INPATIENT ROOMS ARE PRIVATE - WITH BATHROOM, TELEPHONE, TELEVISION AND WIFI INTERNET.   FAILURE TO FOLLOW THESE INSTRUCTIONS MAY RESULT IN THE CANCELLATION OF YOUR SURGERY. PLEASE BE AWARE THAT YOU MAY NEED ADDITIONAL BLOOD DRAWN DAY OF YOUR SURGERY  PATIENT SIGNATURE_________________________________

## 2014-01-19 NOTE — H&P (Signed)
Chief Complaint: Recurrent hiatus hernia with dysphagia and slow emptying.  History of Present Illness: Theresa Holmes is an 73 y.o. female on whom I repaired a hiatal hernia 10 years ago. She did well but recently his started having problems with recurrent reflux and having to sit up at night in a chair. Her upper GI was reviewed and she appears she has some herniation above the wrap. Dr. Cristina Gong scoped her and described a 10 cm hiatal hernia. I discussed repair with her and her on and it is more complex. Interestingly she has lost about 4 inches in height since I did her before 10 years ago. Unfortunately her office record has been destroyed. She wants to proceed with scheduling will do that.  I reviewed my database and her prior giant hiatus hernia was done over a 50 Fr dilator and I placed 3 sutures in the diaphragm and used a pledget on the middle suture.   Past Medical History   Diagnosis  Date   .  Hypertension    .  Hyperlipidemia    .  Exogenous obesity    .  Hypothyroidism    .  Chronic back pain    .  Spinal stenosis    .  Hypercholesterolemia    .  Thyroid mass      right, benign   .  GERD (gastroesophageal reflux disease)    .  H/O hiatal hernia    .  Arthritis    .  Shingles  11/2012    Past Surgical History   Procedure  Laterality  Date   .  Abdominal hysterectomy   1985   .  Tonsillectomy and adenoidectomy       as a child   .  Hernia repair   2004   .  Varicose vein surgery   1977   .  Upper endoscopy w/ sclerotherapy   2000   .  Hammer toe surgery   2002   .  Cholecystectomy     .  Rotator cuff repair   2001   .  Nissen fundoplication     .  Kidney stone surgery   2002   .  Thyroidectomy     .  Spinal fusion   2005     L4 & L5   .  Breast biopsy   9/05     stereotactic   .  Combined augmentation mammaplasty and abdominoplasty   1977   .  Eye surgery   12/05     retinal tear-left   .  Rotator cuff repair   1/12     left   .  Rotator cuff repair   5/13     open     Current Outpatient Prescriptions   Medication  Sig  Dispense  Refill   .  aspirin 81 MG tablet  Take 81 mg by mouth. Two daily     .  celecoxib (CELEBREX) 200 MG capsule  Take 1 capsule (200 mg total) by mouth 2 (two) times daily. As needed     .  esomeprazole (NEXIUM) 40 MG capsule  Take 40 mg by mouth daily before breakfast.     .  estradiol (CLIMARA) 0.025 mg/24hr patch  Place 1 patch (0.025 mg total) onto the skin once a week.  4 patch  13   .  ibuprofen (ADVIL,MOTRIN) 200 MG tablet  Take 1 tablet (200 mg total) by mouth every 6 (six) hours as needed.     Marland Kitchen  levothyroxine (SYNTHROID, LEVOTHROID) 137 MCG tablet  Take 137 mcg by mouth every morning.     Marland Kitchen  losartan (COZAAR) 50 MG tablet  Take 1 tablet (50 mg total) by mouth daily.  30 tablet  2   .  Multiple Vitamin (MULTIVITAMIN WITH MINERALS) TABS  Take 1 tablet by mouth daily.     .  multivitamin-lutein (OCUVITE-LUTEIN) CAPS  Take 1 capsule by mouth daily.     .  Vitamin D, Ergocalciferol, (DRISDOL) 50000 UNITS CAPS capsule  Take 1 capsule (50,000 Units total) by mouth every 30 (thirty) days.  23 capsule  0    No current facility-administered medications for this visit.   Review of patient's allergies indicates no known allergies.  Family History   Problem  Relation  Age of Onset   .  Kidney failure  Father      complete shut down   .  Pulmonary embolism  Mother    .  Heart failure  Father    .  Breast cancer  Maternal Grandmother  80   .  Pulmonary embolism  Mother  46     post partum   Social History: reports that she has never smoked. She has never used smokeless tobacco. She reports that she does not drink alcohol or use illicit drugs.  REVIEW OF SYSTEMS - PERTINENT POSITIVES ONLY:  No history of DVT. Health otherwise been good.  Physical Exam:  Blood pressure 126/84, pulse 76, temperature 98.2 F (36.8 C), temperature source Temporal, resp. rate 16, height 5\' 2"  (1.575 m), weight 204 lb 6.4 oz (92.715 kg), last menstrual  period 11/26/1983.  Body mass index is 37.38 kg/(m^2).  Gen: WDWN white female NAD  Neurological: Alert and oriented to person, place, and time. Motor and sensory function is grossly intact  Head: Normocephalic and atraumatic.  Eyes: Conjunctivae are normal. Pupils are equal, round, and reactive to light. No scleral icterus.  Neck: Normal range of motion. Neck supple. No tracheal deviation or thyromegaly present.  Cardiovascular: SR without murmurs or gallops. No carotid bruits  Respiratory: Effort normal. No respiratory distress. No chest wall tenderness. Breath sounds normal. No wheezes, rales or rhonchi.  Abdomen: nontender  GU:  Musculoskeletal: Normal range of motion. Extremities are nontender. No cyanosis, edema or clubbing noted Lymphadenopathy: No cervical, preauricular, postauricular or axillary adenopathy is present Skin: Skin is warm and dry. No rash noted. No diaphoresis. No erythema. No pallor. Pscyh: Normal mood and affect. Behavior is normal. Judgment and thought content normal.  LABORATORY RESULTS:  No results found for this or any previous visit (from the past 48 hour(s)).  RADIOLOGY RESULTS:  No results found.  Problem List:  Patient Active Problem List    Diagnosis  Date Noted   .  Recurrent hiatus hernia  12/15/2013   .  Chest pain  05/13/2012   .  GERD (gastroesophageal reflux disease)  02/04/2012   .  Benign hypertensive heart disease without heart failure  10/02/2011   .  Pure hypercholesterolemia  10/02/2011   .  Headache  10/02/2011   Assessment & Plan:  Recurrent hiatal hernia and with GERD and dysphagia. Plan redo laparoscopic hiatal hernia repair and Nissen fundoplication.  Matt B. Hassell Done, MD, [facility]  [phone] beeper  [phone]

## 2014-01-20 ENCOUNTER — Other Ambulatory Visit (INDEPENDENT_AMBULATORY_CARE_PROVIDER_SITE_OTHER): Payer: Self-pay | Admitting: Surgery

## 2014-01-20 NOTE — Progress Notes (Signed)
Dr. Hassell Done - reminder that you need to enter preop orders in Epic - pt's surgery is tomorrow - her preop was Monday 2/23.  Her preop labs per anesthesia are in Epic for your review.

## 2014-01-21 ENCOUNTER — Encounter (HOSPITAL_COMMUNITY)
Admission: RE | Disposition: A | Discharge status: Home / Self Care | Payer: Self-pay | Source: Ambulatory Visit | Attending: Surgery

## 2014-01-21 ENCOUNTER — Encounter (HOSPITAL_COMMUNITY): Payer: PRIVATE HEALTH INSURANCE | Admitting: Anesthesiology

## 2014-01-21 ENCOUNTER — Encounter (HOSPITAL_COMMUNITY): Payer: Self-pay | Admitting: *Deleted

## 2014-01-21 ENCOUNTER — Inpatient Hospital Stay (HOSPITAL_COMMUNITY): Payer: PRIVATE HEALTH INSURANCE | Admitting: Anesthesiology

## 2014-01-21 ENCOUNTER — Inpatient Hospital Stay (HOSPITAL_COMMUNITY)
Admission: RE | Admit: 2014-01-21 | Discharge: 2014-01-23 | DRG: 327 | Disposition: A | Discharge status: Home / Self Care | Payer: PRIVATE HEALTH INSURANCE | Source: Ambulatory Visit | Attending: Surgery | Admitting: Surgery

## 2014-01-21 DIAGNOSIS — K219 Gastro-esophageal reflux disease without esophagitis: Principal | ICD-10-CM | POA: Diagnosis present

## 2014-01-21 DIAGNOSIS — Z01812 Encounter for preprocedural laboratory examination: Secondary | ICD-10-CM

## 2014-01-21 DIAGNOSIS — I119 Hypertensive heart disease without heart failure: Secondary | ICD-10-CM | POA: Diagnosis present

## 2014-01-21 DIAGNOSIS — R1310 Dysphagia, unspecified: Secondary | ICD-10-CM | POA: Diagnosis present

## 2014-01-21 DIAGNOSIS — R51 Headache: Secondary | ICD-10-CM | POA: Diagnosis present

## 2014-01-21 DIAGNOSIS — K449 Diaphragmatic hernia without obstruction or gangrene: Secondary | ICD-10-CM | POA: Diagnosis present

## 2014-01-21 DIAGNOSIS — E785 Hyperlipidemia, unspecified: Secondary | ICD-10-CM | POA: Diagnosis present

## 2014-01-21 DIAGNOSIS — Z981 Arthrodesis status: Secondary | ICD-10-CM

## 2014-01-21 DIAGNOSIS — E669 Obesity, unspecified: Secondary | ICD-10-CM | POA: Diagnosis present

## 2014-01-21 DIAGNOSIS — M549 Dorsalgia, unspecified: Secondary | ICD-10-CM | POA: Diagnosis present

## 2014-01-21 DIAGNOSIS — Z8249 Family history of ischemic heart disease and other diseases of the circulatory system: Secondary | ICD-10-CM

## 2014-01-21 DIAGNOSIS — G8929 Other chronic pain: Secondary | ICD-10-CM | POA: Diagnosis present

## 2014-01-21 DIAGNOSIS — Z9889 Other specified postprocedural states: Secondary | ICD-10-CM

## 2014-01-21 DIAGNOSIS — E039 Hypothyroidism, unspecified: Secondary | ICD-10-CM | POA: Diagnosis present

## 2014-01-21 DIAGNOSIS — K2289 Other specified disease of esophagus: Secondary | ICD-10-CM | POA: Diagnosis present

## 2014-01-21 DIAGNOSIS — Z87442 Personal history of urinary calculi: Secondary | ICD-10-CM

## 2014-01-21 DIAGNOSIS — K2100 Gastro-esophageal reflux disease with esophagitis, without bleeding: Secondary | ICD-10-CM

## 2014-01-21 DIAGNOSIS — Z803 Family history of malignant neoplasm of breast: Secondary | ICD-10-CM

## 2014-01-21 DIAGNOSIS — Z7982 Long term (current) use of aspirin: Secondary | ICD-10-CM

## 2014-01-21 DIAGNOSIS — K228 Other specified diseases of esophagus: Secondary | ICD-10-CM | POA: Diagnosis present

## 2014-01-21 DIAGNOSIS — Y838 Other surgical procedures as the cause of abnormal reaction of the patient, or of later complication, without mention of misadventure at the time of the procedure: Secondary | ICD-10-CM | POA: Diagnosis present

## 2014-01-21 DIAGNOSIS — M25519 Pain in unspecified shoulder: Secondary | ICD-10-CM | POA: Diagnosis present

## 2014-01-21 DIAGNOSIS — K929 Disease of digestive system, unspecified: Secondary | ICD-10-CM | POA: Diagnosis present

## 2014-01-21 DIAGNOSIS — D131 Benign neoplasm of stomach: Secondary | ICD-10-CM | POA: Diagnosis present

## 2014-01-21 HISTORY — PX: HIATAL HERNIA REPAIR: SHX195

## 2014-01-21 LAB — CBC
HCT: 39.5 % (ref 36.0–46.0)
HEMOGLOBIN: 12.9 g/dL (ref 12.0–15.0)
MCH: 28.2 pg (ref 26.0–34.0)
MCHC: 32.7 g/dL (ref 30.0–36.0)
MCV: 86.4 fL (ref 78.0–100.0)
Platelets: 236 10*3/uL (ref 150–400)
RBC: 4.57 MIL/uL (ref 3.87–5.11)
RDW: 14.2 % (ref 11.5–15.5)
WBC: 12.3 10*3/uL — AB (ref 4.0–10.5)

## 2014-01-21 LAB — CREATININE, SERUM
CREATININE: 0.62 mg/dL (ref 0.50–1.10)
GFR calc non Af Amer: 88 mL/min — ABNORMAL LOW (ref >90)

## 2014-01-21 SURGERY — REPAIR, HERNIA, HIATAL, LAPAROSCOPIC
Anesthesia: General

## 2014-01-21 MED ORDER — DEXAMETHASONE SODIUM PHOSPHATE 10 MG/ML IJ SOLN
INTRAMUSCULAR | Status: DC | PRN
  Administered 2014-01-21: 10 mg via INTRAVENOUS

## 2014-01-21 MED ORDER — GLYCOPYRROLATE 0.2 MG/ML IJ SOLN
INTRAMUSCULAR | Status: AC
  Filled 2014-01-21: qty 3

## 2014-01-21 MED ORDER — NEOSTIGMINE METHYLSULFATE 1 MG/ML IJ SOLN
INTRAMUSCULAR | Status: AC
  Filled 2014-01-21: qty 10

## 2014-01-21 MED ORDER — CEFOXITIN SODIUM 2 G IV SOLR
2.0000 g | INTRAVENOUS | Status: AC
  Administered 2014-01-21: 2 g via INTRAVENOUS

## 2014-01-21 MED ORDER — LACTATED RINGERS IR SOLN
Status: DC | PRN
  Administered 2014-01-21: 1
  Administered 2014-01-21: 1000 mL

## 2014-01-21 MED ORDER — PROPOFOL 10 MG/ML IV BOLUS
INTRAVENOUS | Status: AC
  Filled 2014-01-21: qty 20

## 2014-01-21 MED ORDER — PHENYLEPHRINE HCL 10 MG/ML IJ SOLN
INTRAMUSCULAR | Status: DC | PRN
  Administered 2014-01-21: 20 ug via INTRAVENOUS
  Administered 2014-01-21: 100 ug via INTRAVENOUS
  Administered 2014-01-21: 20 ug via INTRAVENOUS

## 2014-01-21 MED ORDER — HYDROMORPHONE HCL PF 1 MG/ML IJ SOLN
INTRAMUSCULAR | Status: AC
  Filled 2014-01-21: qty 1

## 2014-01-21 MED ORDER — HEPARIN SODIUM (PORCINE) 5000 UNIT/ML IJ SOLN
5000.0000 [IU] | Freq: Three times a day (TID) | INTRAMUSCULAR | Status: DC
  Administered 2014-01-21 – 2014-01-23 (×5): 5000 [IU] via SUBCUTANEOUS
  Filled 2014-01-21 (×9): qty 1

## 2014-01-21 MED ORDER — DEXAMETHASONE SODIUM PHOSPHATE 10 MG/ML IJ SOLN
INTRAMUSCULAR | Status: AC
  Filled 2014-01-21: qty 1

## 2014-01-21 MED ORDER — SODIUM CHLORIDE 0.9 % IJ SOLN
INTRAMUSCULAR | Status: AC
  Filled 2014-01-21: qty 10

## 2014-01-21 MED ORDER — HEPARIN SODIUM (PORCINE) 5000 UNIT/ML IJ SOLN
5000.0000 [IU] | Freq: Once | INTRAMUSCULAR | Status: AC
  Administered 2014-01-21: 5000 [IU] via SUBCUTANEOUS
  Filled 2014-01-21: qty 1

## 2014-01-21 MED ORDER — PROPOFOL 10 MG/ML IV BOLUS
INTRAVENOUS | Status: DC | PRN
  Administered 2014-01-21: 140 mg via INTRAVENOUS

## 2014-01-21 MED ORDER — LACTATED RINGERS IV SOLN
INTRAVENOUS | Status: DC | PRN
  Administered 2014-01-21 (×4): via INTRAVENOUS

## 2014-01-21 MED ORDER — FENTANYL CITRATE 0.05 MG/ML IJ SOLN
INTRAMUSCULAR | Status: AC
  Filled 2014-01-21: qty 2

## 2014-01-21 MED ORDER — FENTANYL CITRATE 0.05 MG/ML IJ SOLN
INTRAMUSCULAR | Status: AC
  Filled 2014-01-21: qty 5

## 2014-01-21 MED ORDER — MEPERIDINE HCL 50 MG/ML IJ SOLN: 6.2500 mg | INTRAMUSCULAR | Status: DC | PRN

## 2014-01-21 MED ORDER — NEOSTIGMINE METHYLSULFATE 1 MG/ML IJ SOLN
INTRAMUSCULAR | Status: DC | PRN
  Administered 2014-01-21: 5 mg via INTRAVENOUS

## 2014-01-21 MED ORDER — HYDROMORPHONE HCL PF 1 MG/ML IJ SOLN
0.5000 mg | INTRAMUSCULAR | Status: DC | PRN
  Administered 2014-01-21 – 2014-01-23 (×6): 0.5 mg via INTRAVENOUS
  Filled 2014-01-21 (×6): qty 1

## 2014-01-21 MED ORDER — CHLORHEXIDINE GLUCONATE 4 % EX LIQD: 1.0000 "application " | Freq: Once | CUTANEOUS | Status: DC

## 2014-01-21 MED ORDER — ONDANSETRON HCL 4 MG PO TABS: 4.0000 mg | ORAL_TABLET | Freq: Four times a day (QID) | ORAL | Status: DC | PRN

## 2014-01-21 MED ORDER — LIP MEDEX EX OINT
TOPICAL_OINTMENT | CUTANEOUS | Status: AC
  Administered 2014-01-21: 17:00:00
  Filled 2014-01-21: qty 7

## 2014-01-21 MED ORDER — OXYCODONE HCL 5 MG PO TABS: 5.0000 mg | ORAL_TABLET | Freq: Once | ORAL | Status: DC | PRN

## 2014-01-21 MED ORDER — LACTATED RINGERS IV SOLN: INTRAVENOUS | Status: DC

## 2014-01-21 MED ORDER — SUCCINYLCHOLINE CHLORIDE 20 MG/ML IJ SOLN
INTRAMUSCULAR | Status: DC | PRN
  Administered 2014-01-21: 100 mg via INTRAVENOUS

## 2014-01-21 MED ORDER — FENTANYL CITRATE 0.05 MG/ML IJ SOLN
INTRAMUSCULAR | Status: DC | PRN
  Administered 2014-01-21: 50 ug via INTRAVENOUS
  Administered 2014-01-21: 100 ug via INTRAVENOUS
  Administered 2014-01-21: 50 ug via INTRAVENOUS
  Administered 2014-01-21: 25 ug via INTRAVENOUS
  Administered 2014-01-21 (×2): 50 ug via INTRAVENOUS
  Administered 2014-01-21: 25 ug via INTRAVENOUS

## 2014-01-21 MED ORDER — PHENYLEPHRINE 40 MCG/ML (10ML) SYRINGE FOR IV PUSH (FOR BLOOD PRESSURE SUPPORT)
PREFILLED_SYRINGE | INTRAVENOUS | Status: AC
  Filled 2014-01-21: qty 10

## 2014-01-21 MED ORDER — BUPIVACAINE LIPOSOME 1.3 % IJ SUSP
20.0000 mL | Freq: Once | INTRAMUSCULAR | Status: AC
  Administered 2014-01-21: 20 mL
  Filled 2014-01-21: qty 20

## 2014-01-21 MED ORDER — 0.9 % SODIUM CHLORIDE (POUR BTL) OPTIME
TOPICAL | Status: DC | PRN
  Administered 2014-01-21: 1000 mL

## 2014-01-21 MED ORDER — MIDAZOLAM HCL 2 MG/2ML IJ SOLN
INTRAMUSCULAR | Status: AC
  Filled 2014-01-21: qty 2

## 2014-01-21 MED ORDER — CISATRACURIUM BESYLATE (PF) 10 MG/5ML IV SOLN
INTRAVENOUS | Status: DC | PRN
  Administered 2014-01-21 (×3): 4 mg via INTRAVENOUS
  Administered 2014-01-21: 6 mg via INTRAVENOUS
  Administered 2014-01-21: 5 mg via INTRAVENOUS

## 2014-01-21 MED ORDER — KCL IN DEXTROSE-NACL 20-5-0.45 MEQ/L-%-% IV SOLN
INTRAVENOUS | Status: DC
  Administered 2014-01-21 – 2014-01-23 (×4): via INTRAVENOUS
  Filled 2014-01-21 (×5): qty 1000

## 2014-01-21 MED ORDER — OXYCODONE HCL 5 MG/5ML PO SOLN
5.0000 mg | Freq: Once | ORAL | Status: DC | PRN
  Filled 2014-01-21: qty 5

## 2014-01-21 MED ORDER — GLYCOPYRROLATE 0.2 MG/ML IJ SOLN
INTRAMUSCULAR | Status: DC | PRN
  Administered 2014-01-21: 0.6 mg via INTRAVENOUS

## 2014-01-21 MED ORDER — PROMETHAZINE HCL 25 MG/ML IJ SOLN
6.2500 mg | INTRAMUSCULAR | Status: DC | PRN
  Administered 2014-01-21: 12.5 mg via INTRAVENOUS

## 2014-01-21 MED ORDER — HYDROMORPHONE HCL PF 1 MG/ML IJ SOLN
0.2500 mg | INTRAMUSCULAR | Status: DC | PRN
  Administered 2014-01-21 (×3): 0.5 mg via INTRAVENOUS

## 2014-01-21 MED ORDER — MIDAZOLAM HCL 5 MG/5ML IJ SOLN
INTRAMUSCULAR | Status: DC | PRN
  Administered 2014-01-21: 1 mg via INTRAVENOUS

## 2014-01-21 MED ORDER — PROMETHAZINE HCL 25 MG/ML IJ SOLN
INTRAMUSCULAR | Status: AC
  Filled 2014-01-21: qty 1

## 2014-01-21 MED ORDER — CISATRACURIUM BESYLATE 20 MG/10ML IV SOLN
INTRAVENOUS | Status: AC
  Filled 2014-01-21: qty 10

## 2014-01-21 MED ORDER — ONDANSETRON HCL 4 MG/2ML IJ SOLN
INTRAMUSCULAR | Status: DC | PRN
  Administered 2014-01-21: 4 mg via INTRAVENOUS

## 2014-01-21 MED ORDER — ONDANSETRON HCL 4 MG/2ML IJ SOLN: 4.0000 mg | Freq: Four times a day (QID) | INTRAMUSCULAR | Status: DC | PRN

## 2014-01-21 MED ORDER — TISSEEL VH 10 ML EX KIT
PACK | CUTANEOUS | Status: AC
Start: 2014-01-21 — End: 2014-01-21
  Filled 2014-01-21: qty 1

## 2014-01-21 MED ORDER — EPHEDRINE SULFATE 50 MG/ML IJ SOLN
INTRAMUSCULAR | Status: AC
  Filled 2014-01-21: qty 1

## 2014-01-21 MED ORDER — ONDANSETRON HCL 4 MG/2ML IJ SOLN
INTRAMUSCULAR | Status: AC
  Filled 2014-01-21: qty 2

## 2014-01-21 MED ORDER — DEXTROSE 5 % IV SOLN
1.0000 g | Freq: Four times a day (QID) | INTRAVENOUS | Status: AC
  Administered 2014-01-21 – 2014-01-22 (×3): 1 g via INTRAVENOUS
  Filled 2014-01-21 (×3): qty 1

## 2014-01-21 MED ORDER — EPHEDRINE SULFATE 50 MG/ML IJ SOLN
INTRAMUSCULAR | Status: DC | PRN
  Administered 2014-01-21 (×3): 7.5 mg via INTRAVENOUS

## 2014-01-21 SURGICAL SUPPLY — 62 items
APPLIER CLIP ROT 10 11.4 M/L (STAPLE)
BENZOIN TINCTURE PRP APPL 2/3 (GAUZE/BANDAGES/DRESSINGS) IMPLANT
CABLE HIGH FREQUENCY MONO STRZ (ELECTRODE) IMPLANT
CANISTER SUCTION 2500CC (MISCELLANEOUS) IMPLANT
CLAMP ENDO BABCK 10MM (STAPLE) IMPLANT
CLIP APPLIE ROT 10 11.4 M/L (STAPLE) IMPLANT
COVER SURGICAL LIGHT HANDLE (MISCELLANEOUS) IMPLANT
DECANTER SPIKE VIAL GLASS SM (MISCELLANEOUS) IMPLANT
DERMABOND ADVANCED (GAUZE/BANDAGES/DRESSINGS) ×1
DERMABOND ADVANCED .7 DNX12 (GAUZE/BANDAGES/DRESSINGS) ×1 IMPLANT
DEVICE SUT QUICK LOAD TK 5 (STAPLE) ×8 IMPLANT
DEVICE SUT TI-KNOT TK 5X26 (MISCELLANEOUS) ×2 IMPLANT
DEVICE SUTURE ENDOST 10MM (ENDOMECHANICALS) ×2 IMPLANT
DISSECTOR BLUNT TIP ENDO 5MM (MISCELLANEOUS) ×2 IMPLANT
DRAIN CHANNEL 19F RND (DRAIN) ×2 IMPLANT
DRAIN PENROSE 18X1/2 LTX STRL (DRAIN) ×2 IMPLANT
DRAPE LAPAROSCOPIC ABDOMINAL (DRAPES) ×2 IMPLANT
DUPLOCATH 180 (CATHETERS) ×2 IMPLANT
ELECT REM PT RETURN 9FT ADLT (ELECTROSURGICAL) ×2
ELECTRODE REM PT RTRN 9FT ADLT (ELECTROSURGICAL) ×1 IMPLANT
EVACUATOR DRAINAGE 10X20 100CC (DRAIN) ×1 IMPLANT
EVACUATOR SILICONE 100CC (DRAIN) ×1
FELT TEFLON 4 X1 (Mesh General) ×2 IMPLANT
FILTER SMOKE EVAC LAPAROSHD (FILTER) IMPLANT
GLOVE BIOGEL M 8.0 STRL (GLOVE) ×2 IMPLANT
GLOVE BIOGEL PI IND STRL 7.0 (GLOVE) ×1 IMPLANT
GLOVE BIOGEL PI INDICATOR 7.0 (GLOVE) ×1
GOWN STRL REUS W/TWL LRG LVL3 (GOWN DISPOSABLE) ×2 IMPLANT
GOWN STRL REUS W/TWL XL LVL3 (GOWN DISPOSABLE) ×8 IMPLANT
GRASPER ENDO BABCOCK 10 (MISCELLANEOUS) IMPLANT
GRASPER ENDO BABCOCK 10MM (MISCELLANEOUS)
KIT BASIN OR (CUSTOM PROCEDURE TRAY) ×2 IMPLANT
NS IRRIG 1000ML POUR BTL (IV SOLUTION) ×2 IMPLANT
PENCIL BUTTON HOLSTER BLD 10FT (ELECTRODE) IMPLANT
RELOAD ENDO STITCH 2.0 (ENDOMECHANICALS) ×2
SCALPEL HARMONIC ACE (MISCELLANEOUS) ×2 IMPLANT
SCISSORS LAP 5X35 DISP (ENDOMECHANICALS) IMPLANT
SCISSORS LAP 5X45 EPIX DISP (ENDOMECHANICALS) ×4 IMPLANT
SET IRRIG TUBING LAPAROSCOPIC (IRRIGATION / IRRIGATOR) ×2 IMPLANT
SLEEVE ADV FIXATION 5X100MM (TROCAR) ×6 IMPLANT
SOLUTION ANTI FOG 6CC (MISCELLANEOUS) ×2 IMPLANT
STAPLER VISISTAT 35W (STAPLE) ×2 IMPLANT
STRIP CLOSURE SKIN 1/2X4 (GAUZE/BANDAGES/DRESSINGS) IMPLANT
SUT ETHILON 2 0 PS N (SUTURE) ×2 IMPLANT
SUT RELOAD ENDO STITCH 2 48X1 (ENDOMECHANICALS) ×2
SUT SURGIDAC NAB ES-9 0 48 120 (SUTURE) ×12 IMPLANT
SUT VIC AB 4-0 SH 18 (SUTURE) ×2 IMPLANT
SUTURE RELOAD END STTCH 2 48X1 (ENDOMECHANICALS) ×2 IMPLANT
SYR 30ML LL (SYRINGE) ×2 IMPLANT
TIP INNERVISION DETACH 40FR (MISCELLANEOUS) IMPLANT
TIP INNERVISION DETACH 50FR (MISCELLANEOUS) IMPLANT
TIP INNERVISION DETACH 56FR (MISCELLANEOUS) ×2 IMPLANT
TIPS INNERVISION DETACH 40FR (MISCELLANEOUS)
TRAY FOLEY CATH 14FRSI W/METER (CATHETERS) ×2 IMPLANT
TRAY LAP CHOLE (CUSTOM PROCEDURE TRAY) ×2 IMPLANT
TROCAR ADV FIXATION 11X100MM (TROCAR) ×2 IMPLANT
TROCAR ADV FIXATION 5X100MM (TROCAR) ×2 IMPLANT
TROCAR BLADELESS OPT 5 100 (ENDOMECHANICALS) ×2 IMPLANT
TROCAR XCEL BLUNT TIP 100MML (ENDOMECHANICALS) IMPLANT
TROCAR XCEL NON-BLD 11X100MML (ENDOMECHANICALS) ×2 IMPLANT
TROCAR XCEL UNIV SLVE 11M 100M (ENDOMECHANICALS) IMPLANT
TUBING FILTER THERMOFLATOR (ELECTROSURGICAL) ×2 IMPLANT

## 2014-01-21 NOTE — Transfer of Care (Signed)
Immediate Anesthesia Transfer of Care Note  Patient: Theresa Holmes  Procedure(s) Performed: Procedure(s): LAPAROSCOPIC REPAIR RECURRENT  HIATAL HERNIA REDO NISSEN, upper endoscopy (N/A)  Patient Location: PACU  Anesthesia Type:General  Level of Consciousness: awake, alert , sedated and patient cooperative  Airway & Oxygen Therapy: Patient Spontanous Breathing and Patient connected to face mask oxygen  Post-op Assessment: Report given to PACU RN and Post -op Vital signs reviewed and stable  Post vital signs: Reviewed and stable  Complications: No apparent anesthesia complications

## 2014-01-21 NOTE — Interval H&P Note (Signed)
History and Physical Interval Note:  01/21/2014 7:11 AM  Theresa Holmes  has presented today for surgery, with the diagnosis of recurrent type III hiatus hernia  The various methods of treatment have been discussed with the patient and family. After consideration of risks, benefits and other options for treatment, the patient has consented to  Procedure(s): [place] (N/A) as a surgical intervention .  The patient's history has been reviewed, patient examined, no change in status, stable for surgery.  I have reviewed the patient's chart and labs.  Questions were answered to the patient's satisfaction.     Caton Popowski B

## 2014-01-21 NOTE — Anesthesia Preprocedure Evaluation (Addendum)
Anesthesia Evaluation  Patient identified by MRN, date of birth, ID band Patient awake    Reviewed: Allergy & Precautions, H&P , NPO status , Patient's Chart, lab work & pertinent test results  Airway Mallampati: II TM Distance: >3 FB Neck ROM: Full    Dental  (+) Dental Advisory Given   Pulmonary neg pulmonary ROS,  breath sounds clear to auscultation        Cardiovascular hypertension, Pt. on medications negative cardio ROS  Rhythm:Regular Rate:Normal     Neuro/Psych  Headaches, negative psych ROS   GI/Hepatic Neg liver ROS, hiatal hernia, GERD-  Medicated,  Endo/Other  Hypothyroidism   Renal/GU negative Renal ROS     Musculoskeletal negative musculoskeletal ROS (+)   Abdominal   Peds  Hematology negative hematology ROS (+)   Anesthesia Other Findings   Reproductive/Obstetrics negative OB ROS                          Anesthesia Physical Anesthesia Plan  ASA: II  Anesthesia Plan: General   Post-op Pain Management:    Induction: Intravenous and Rapid sequence  Airway Management Planned: Oral ETT  Additional Equipment:   Intra-op Plan:   Post-operative Plan: Extubation in OR  Informed Consent: I have reviewed the patients History and Physical, chart, labs and discussed the procedure including the risks, benefits and alternatives for the proposed anesthesia with the patient or authorized representative who has indicated his/her understanding and acceptance.   Dental advisory given  Plan Discussed with: CRNA  Anesthesia Plan Comments:         Anesthesia Quick Evaluation

## 2014-01-21 NOTE — Anesthesia Postprocedure Evaluation (Signed)
Anesthesia Post Note  Patient: Theresa Holmes  Procedure(s) Performed: Procedure(s) (LRB): LAPAROSCOPIC REPAIR RECURRENT  HIATAL HERNIA REDO NISSEN, upper endoscopy (N/A)  Anesthesia type: General  Patient location: PACU  Post pain: Pain level controlled  Post assessment: Post-op Vital signs reviewed  Last Vitals: BP 101/63  Pulse 74  Temp(Src) 36.4 C (Oral)  Resp 19  SpO2 100%  LMP 11/26/1983  Post vital signs: Reviewed  Level of consciousness: sedated  Complications: No apparent anesthesia complications

## 2014-01-21 NOTE — Op Note (Signed)
01/21/2014  12:02 PM  PATIENT:  Theresa Holmes, 73 y.o., female, MRN: [id number]  PREOP DIAGNOSIS:  Slipped nissen fundoplication, hiatal hernia  POSTOP DIAGNOSIS:   Esophageal dilatation, multiple gastric fundic polyps  PROCEDURE:  Esophagogastroscopy  SURGEON:   Alphonsa Overall, M.D.  ANESTHESIA:  GET  Nickie Retort, MD   INDICATIONS FOR PROCEDURE:  Theresa Holmes is a 73 y.o. (DOB: 02/05/1941)  white  female whose primary care physician is Sheela Stack, MD.  She had a Nissen fundoplication by Dr. Rockne Coons approximately 2005.  She has developed recurrent reflux and appears on UGI to have a herniation above the wrap.   I am doing the upper endoscopy while the patient is in OR room #1 under general anesthesia.  Dr. Hassell Done is laparoscoping the patient to evaluate the esophagus and stomach from outside.  In taking down the wrap, there was an apparent hole made in the stomach and we want to make sure that this is sealed.  PROCEDURE:  The patient is asleep in OR room #1 at [facility].  I passed the Pentax endoscope was passed down the throat without difficulty.  Findings include:   Esophagus:   Dilated with retained food material.  It looked like a piece of Kiwi was in the esophagus.   GE junction at:  36 cm   Stomach: Multiple gastric polyps.  All benign appearing. No evidence of leak from gastric repair site.   Duodenum:   Not evaluated.  Dr. Hassell Done will dictate the remainder of the operation.  Alphonsa Overall, MD, [facility] Pager: [phone] Office phone:  [phone]

## 2014-01-21 NOTE — Op Note (Signed)
Surgeon: Kaylyn Lim, MD, FACS  Asst:  Alphonsa Overall, MD, FACS  Anes:  general  Procedure: Laparoscopic takedown of herniated Nissen; closure of the hiatus; endoscopy; redo Nissen fundoplication over 56 dilator  Diagnosis: Recurrent GERD and esophageal stasis  Complications: none  EBL:   20 cc  Description of Procedure:  The patient was taken to OR 1 and given general anesthesia.  The abdomen was prepped with PCMX and draped sterilely.  A timeout was performed.  The procedure was performed with 5 -54mm trocar sites and one 11 mm trocar site.  The Sherrie Sport was placed beneath the left lateral segment and the foregut dissection began and was completed some 4 hours later.  Sharp dissection was begun taking the stomach down from the left lateral segment.. There was a visible diaphragmatic defect with a portion of the stomach in the chest.  I first took down these attachments on the right side and encountered and a previous wrap and sutures that had clips on these. These were stuck to the right hemidiaphragm and these were taken down with sharp dissection. On the left side I took down some adhesions to the left crura and spent some time mobilizing the stomach posteriorly again all this down.  Previously a single pledgeted been used in a portion of the diaphragmatic closure and this is where the stomach was densely adherent to this and this took some time to take down. There was a anterior cardia perforation noted where a suture had been and it had pulled through. This is a side of the wrap and I repaired that with a Endo Stitch and Vicryl initially and then went back at the end of the completed wrap and tucked that in with a figure-of-eight suture of 2-0 Vicryl. Tisseel was also applied the that was the very last step in this procedure. In the meanwhile we freed up the stomach and identified the parts of the wrap and how the rapid herniated into the chest.  Dr. Lucia Gaskins then passed the endoscope and noted  marked stasis and dilatation of her esophagus. Once he was in the stomach we insufflated and looked at it in retrospect of manner in this way we noted where the previous low perforation had been and repair at that point did not show any evidence of a leak. We then completed mobilizing the foregut and completely freed up posteriorly and completely undid the wrap.    I then repaired the hiatus posteriorly with 3 sutures using the Endo Stitch and 0 Surgidek. 2 of the 3 were secured with Ty knots and 1 with and free tie. I then passed a 56 lighted bougie in a pre-well occupied the hiatus with that degree of closure. With that in place I reached around and grasped portion the stomach the wrapped and brought it around and then found another site for the Nissen. 3 sutures were placed with the Endo Stitch secured with Ty knots. The bougie was removed and the wrap looked good and we were trying for something that was not too long and floppy since we suspected a degree of esophageal dysmotility. The area that had previous closed 400 and 0 Vicryl in with the Endo Stitch in a figure-of-eight fashion tying it down putting Tisseel on that. Tisseel was used on the wrap. 19 Blake drain was placed under the diaphragm brought to the left side. Sponge and needle counts reported as correct. Wounds were injected with Exparel. Skin was closed with 4-0 Vicryl and Dermabond. Matt B.  Hassell Done, [place], [facility], [place]

## 2014-01-22 ENCOUNTER — Inpatient Hospital Stay (HOSPITAL_COMMUNITY): Payer: PRIVATE HEALTH INSURANCE

## 2014-01-22 LAB — BASIC METABOLIC PANEL
BUN: 11 mg/dL (ref 6–23)
CHLORIDE: 105 meq/L (ref 96–112)
CO2: 30 meq/L (ref 19–32)
Calcium: 8.5 mg/dL (ref 8.4–10.5)
Creatinine, Ser: 0.68 mg/dL (ref 0.50–1.10)
GFR calc Af Amer: >90 mL/min (ref >90)
GFR calc non Af Amer: 85 mL/min — ABNORMAL LOW (ref >90)
Glucose, Bld: 136 mg/dL — ABNORMAL HIGH (ref 70–99)
POTASSIUM: 4.1 meq/L (ref 3.7–5.3)
Sodium: 143 mEq/L (ref 137–147)

## 2014-01-22 LAB — CBC
HCT: 36.5 % (ref 36.0–46.0)
Hemoglobin: 11.9 g/dL — ABNORMAL LOW (ref 12.0–15.0)
MCH: 28.6 pg (ref 26.0–34.0)
MCHC: 32.6 g/dL (ref 30.0–36.0)
MCV: 87.7 fL (ref 78.0–100.0)
PLATELETS: 251 10*3/uL (ref 150–400)
RBC: 4.16 MIL/uL (ref 3.87–5.11)
RDW: 14.4 % (ref 11.5–15.5)
WBC: 9.2 10*3/uL (ref 4.0–10.5)

## 2014-01-22 MED ORDER — IOHEXOL 300 MG/ML  SOLN
50.0000 mL | Freq: Once | INTRAMUSCULAR | Status: AC | PRN
  Administered 2014-01-22: 50 mL via ORAL

## 2014-01-22 MED ORDER — ACETAMINOPHEN 325 MG PO TABS
650.0000 mg | ORAL_TABLET | Freq: Four times a day (QID) | ORAL | Status: DC | PRN
  Administered 2014-01-22 – 2014-01-23 (×2): 650 mg via ORAL
  Filled 2014-01-22 (×2): qty 2

## 2014-01-22 NOTE — Progress Notes (Addendum)
Patient ID: Theresa Holmes, female   DOB: May 04, 1941, 73 y.o.   MRN: [id number] 1 Day Post-Op  Subjective: Feels very well. Having some left shoulder pain. No abdominal pain or nausea.  Objective: Vital signs in last 24 hours: Temp:  [97 F (36.1 C)-97.9 F (36.6 C)] 97.9 F (36.6 C) (02/28 0451) Pulse Rate:  [54-91] 62 (02/28 0451) Resp:  [10-19] 18 (02/28 0451) BP: (99-145)/(50-85) 106/68 mmHg (02/28 0451) SpO2:  [92 %-100 %] 92 % (02/28 0451) Weight:  [205 lb 0.4 oz (93 kg)] 205 lb 0.4 oz (93 kg) (02/27 1445) Last BM Date: 01/20/14  Intake/Output from previous day: 02/27 0701 - 02/28 0700 In: 4960 [I.V.:4960] Out: 2173 [Urine:2050; Drains:23; Blood:100] Intake/Output this shift:    General appearance: alert, cooperative and no distress GI: normal findings: soft, non-tender Incision/Wound: clean and dry JP drainage is clear serosanguineous  Lab Results:   Recent Labs  01/21/14 1608 01/22/14 0420  WBC 12.3* 9.2  HGB 12.9 11.9*  HCT 39.5 36.5  PLT 236 251   BMET  Recent Labs  01/21/14 1608 01/22/14 0420  NA  --  143  K  --  4.1  CL  --  105  CO2  --  30  GLUCOSE  --  136*  BUN  --  11  CREATININE 0.62 0.68  CALCIUM  --  8.5     Studies/Results: No results found.  Anti-infectives: Anti-infectives   Start     Dose/Rate Route Frequency Ordered Stop   01/21/14 1500  cefOXitin (MEFOXIN) 1 g in dextrose 5 % 50 mL IVPB     1 g 100 mL/hr over 30 Minutes Intravenous Every 6 hours 01/21/14 1431 01/22/14 0342   01/21/14 0546  cefOXitin (MEFOXIN) 2 g in dextrose 5 % 50 mL IVPB     2 g 100 mL/hr over 30 Minutes Intravenous On call to O.R. 01/21/14 0546 01/21/14 0715      Assessment/Plan: s/p Procedure(s): LAPAROSCOPIC REPAIR RECURRENT  HIATAL HERNIA REDO NISSEN, upper endoscopy Doing well postoperatively without apparent complication. Preliminary report on Gastrografin swallow was negative for leak or obstruction Start clear liquid diet.   LOS: 1 day     Adesuwa Osgood T 01/22/2014

## 2014-01-23 LAB — BASIC METABOLIC PANEL
BUN: 7 mg/dL (ref 6–23)
CHLORIDE: 100 meq/L (ref 96–112)
CO2: 30 mEq/L (ref 19–32)
Calcium: 8.4 mg/dL (ref 8.4–10.5)
Creatinine, Ser: 0.68 mg/dL (ref 0.50–1.10)
GFR calc Af Amer: >90 mL/min (ref >90)
GFR calc non Af Amer: 85 mL/min — ABNORMAL LOW (ref >90)
Glucose, Bld: 108 mg/dL — ABNORMAL HIGH (ref 70–99)
POTASSIUM: 4.2 meq/L (ref 3.7–5.3)
SODIUM: 140 meq/L (ref 137–147)

## 2014-01-23 LAB — CBC
HEMATOCRIT: 36.2 % (ref 36.0–46.0)
HEMOGLOBIN: 12.1 g/dL (ref 12.0–15.0)
MCH: 29.6 pg (ref 26.0–34.0)
MCHC: 33.4 g/dL (ref 30.0–36.0)
MCV: 88.5 fL (ref 78.0–100.0)
Platelets: 255 10*3/uL (ref 150–400)
RBC: 4.09 MIL/uL (ref 3.87–5.11)
RDW: 14.5 % (ref 11.5–15.5)
WBC: 7.9 10*3/uL (ref 4.0–10.5)

## 2014-01-23 MED ORDER — HYDROCODONE-ACETAMINOPHEN 7.5-325 MG/15ML PO SOLN
15.0000 mL | Freq: Once | ORAL | Status: AC
  Administered 2014-01-23: 15 mL via ORAL
  Filled 2014-01-23: qty 15

## 2014-01-23 MED ORDER — HYDROCODONE-ACETAMINOPHEN 7.5-500 MG/15ML PO SOLN: 15.0000 mL | Freq: Four times a day (QID) | ORAL | Status: DC | PRN

## 2014-01-23 NOTE — Progress Notes (Signed)
Patient ID: Theresa Holmes, female   DOB: [birth year], 73 y.o.   MRN: [id number] 2 Days Post-Op  Subjective: Headache his main complaint. No abdominal or GI complaints. Abdomen just mildly sore. Tolerating clear liquids without difficulty.  Objective: Vital signs in last 24 hours: Temp:  [97.8 F (36.6 C)-99.2 F (37.3 C)] 99 F (37.2 C) (03/01 0605) Pulse Rate:  [63-80] 72 (03/01 0605) Resp:  [16-18] 16 (03/01 0605) BP: (81-135)/(55-68) 135/59 mmHg (03/01 0605) SpO2:  [90 %-94 %] 90 % (03/01 0605) Last BM Date: 01/20/14  Intake/Output from previous day: 02/28 0701 - 03/01 0700 In: 1974.2 [I.V.:1949.2] Out: 4020 [Urine:3900; Drains:120] Intake/Output this shift:    General appearance: alert, cooperative and no distress GI: normal findings: soft, non-tender Incision/Wound: cclean and dry. JP drainage clear serosanguineous  Lab Results:   Recent Labs  01/22/14 0420 01/23/14 0542  WBC 9.2 7.9  HGB 11.9* 12.1  HCT 36.5 36.2  PLT 251 255   BMET  Recent Labs  01/22/14 0420 01/23/14 0542  NA 143 140  K 4.1 4.2  CL 105 100  CO2 30 30  GLUCOSE 136* 108*  BUN 11 7  CREATININE 0.68 0.68  CALCIUM 8.5 8.4     Studies/Results: Dg Ugi W/water Sol Cm  01/22/2014   CLINICAL DATA:  Post redo Nissen fundoplication, evaluate for stricture or leak  EXAM: WATER SOLUBLE UPPER GI SERIES  TECHNIQUE: Single-column upper GI series was performed using water soluble contrast.  CONTRAST:  20mL OMNIPAQUE IOHEXOL 300 MG/ML  SOLN  COMPARISON:  DG CHEST 2 VIEW dated 12/22/2013; DG UGI W/HIGH DENSITY W/KUB dated 11/24/2013  FLUOROSCOPY TIME:  Thirty-two second  FINDINGS: There is mild patulous distention of the distal aspect of the esophagus, however there is brisk passage of water-soluble contrast through the distal esophagus and into the stomach.  Sequela of redo Nissen fundoplication with mild gastric wall thickening suggestive of postsurgical edema.  No evidence of hiatal hernia.  No definite  evidence of enteric leak.  A surgical drain overlies the operative site. Incidental note is also made of a peripherally calcified left-sided breast prosthesis. No radiopaque foreign body.  IMPRESSION: Brisk passage of contrast through a mildly edematous redo Nissen fundoplication wrap. No evidence of contrast extravasation.   Electronically Signed   By: Sandi Mariscal M.D.   On: 01/22/2014 09:35    Anti-infectives: Anti-infectives   Start     Dose/Rate Route Frequency Ordered Stop   01/21/14 1500  cefOXitin (MEFOXIN) 1 g in dextrose 5 % 50 mL IVPB     1 g 100 mL/hr over 30 Minutes Intravenous Every 6 hours 01/21/14 1431 01/22/14 0342   01/21/14 0546  cefOXitin (MEFOXIN) 2 g in dextrose 5 % 50 mL IVPB     2 g 100 mL/hr over 30 Minutes Intravenous On call to O.R. 01/21/14 0546 01/21/14 0715      Assessment/Plan: s/p Procedure(s): LAPAROSCOPIC REPAIR RECURRENT  HIATAL HERNIA REDO NISSEN, upper endoscopy Doing well without evidence of complication. Okay for discharge. Removed drain prior to discharge.   LOS: 2 days    Aline Wesche T 01/23/2014

## 2014-01-23 NOTE — Discharge Instructions (Signed)
[facility] ______[facility] °LAPAROSCOPIC SURGERY: POST OP INSTRUCTIONS °Always review your discharge instruction sheet given to you by the facility where your surgery was performed. °IF YOU HAVE DISABILITY OR FAMILY LEAVE FORMS, YOU MUST BRING THEM TO THE OFFICE FOR PROCESSING.   °DO NOT GIVE THEM TO YOUR DOCTOR. ° °1. A prescription for pain medication may be given to you upon discharge.  Take your pain medication as prescribed, if needed.  If narcotic pain medicine is not needed, then you may take acetaminophen (Tylenol) or ibuprofen (Advil) as needed. °2. Take your usually prescribed medications unless otherwise directed. °3. If you need a refill on your pain medication, please contact your pharmacy.  They will contact our office to request authorization. Prescriptions will not be filled after 5pm or on week-ends. °4. You should follow a light diet the first few days after arrival home, such as soup and crackers, etc.  Be sure to include lots of fluids daily. °5. Most patients will experience some swelling and bruising in the area of the incisions.  Ice packs will help.  Swelling and bruising can take several days to resolve.  °6. It is common to experience some constipation if taking pain medication after surgery.  Increasing fluid intake and taking a stool softener (such as Colace) will usually help or prevent this problem from occurring.  A mild laxative (Milk of Magnesia or Miralax) should be taken according to package instructions if there are no bowel movements after 48 hours. °7. Unless discharge instructions indicate otherwise, you may remove your bandages 24-48 hours after surgery, and you may shower at that time.  You may have steri-strips (small skin tapes) in place directly over the incision.  These strips should be left on the skin for 7-10 days.  If your surgeon used skin glue on the incision, you may shower in 24 hours.  The glue will flake off over the next 2-3 weeks.  Any sutures or  staples will be removed at the office during your follow-up visit. °8. ACTIVITIES:  You may resume regular (light) daily activities beginning the next day--such as daily self-care, walking, climbing stairs--gradually increasing activities as tolerated.  You may have sexual intercourse when it is comfortable.  Refrain from any heavy lifting or straining until approved by your doctor. °a. You may drive when you are no longer taking prescription pain medication, you can comfortably wear a seatbelt, and you can safely maneuver your car and apply brakes. °b. RETURN TO WORK:  __________________________________________________________ °9. You should see your doctor in the office for a follow-up appointment approximately 2-3 weeks after your surgery.  Make sure that you call for this appointment within a day or two after you arrive home to insure a convenient appointment time. °10. OTHER INSTRUCTIONS: __________________________________________________________________________________________________________________________ __________________________________________________________________________________________________________________________ °WHEN TO CALL YOUR DOCTOR: °1. Fever over 101.0 °2. Inability to urinate °3. Continued bleeding from incision. °4. Increased pain, redness, or drainage from the incision. °5. Increasing abdominal pain ° °The clinic staff is available to answer your questions during regular business hours.  Please don’t hesitate to call and ask to speak to one of the nurses for clinical concerns.  If you have a medical emergency, go to the nearest emergency room or call 911.  A surgeon from [facility] is always on call at the hospital. °[address], [address], [place], [place]  [address] ? [address] [address], [place], [place]   [address] °[phone] ? [phone] ? FAX [phone] °Web site:   [email or web address] °

## 2014-01-24 ENCOUNTER — Encounter (HOSPITAL_COMMUNITY): Payer: Self-pay | Admitting: Surgery

## 2014-02-01 NOTE — Discharge Summary (Signed)
Physician Discharge Summary  Patient ID: Theresa Holmes MRN: [id number] DOB/AGE: 1941-07-22 73 y.o.  Admit date: 01/21/2014 Discharge date: 01/23/2014  Admission Diagnoses:  Recurrent GERD and hiatus hernia  Discharge Diagnoses:  Same postop   Active Problems:   Repair recurrent hiatus hernia and Nissen fundoplication over 56 dilator Feb 2015   Surgery:  Laparoscopic repair of recurrent hiatus hernia and Nissen fundopllication  Discharged Condition: improved  Hospital Course:   Had surgery;  Swallow on PD 1 looked good.  Tolerating liquids.  Drain removed and discharged on PD 2  Consults: none  Significant Diagnostic Studies: UGI    Discharge Exam: Blood pressure 135/59, pulse 72, temperature 99 F (37.2 C), temperature source Oral, resp. rate 16, height 5\' 3"  (1.6 m), weight 205 lb 0.4 oz (93 kg), last menstrual period 11/26/1983, SpO2 90.00%. Discharged by weekend team.  Anticipated degree of discomfort  Disposition: 01-Home or Self Care  Discharge Orders   Future Appointments Provider Department Dept Phone   02/04/2014 3:30 PM Pedro Earls, MD [facility], [facility]   04/20/2014 7:45 AM Hayden Pedro, MD [place] [phone]   08/22/2014 2:00 PM Lyman Speller, MD [facility] [phone]   Future Orders Complete By Expires   Discharge patient  As directed        Medication List         aspirin EC 81 MG tablet  Take 162 mg by mouth 2 (two) times a week. Monday and Wednesday     B-complex with vitamin C tablet  Take 1 tablet by mouth daily.     esomeprazole 40 MG capsule  Commonly known as:  NEXIUM  Take 40 mg by mouth daily before breakfast.     estradiol 0.025 mg/24hr patch  Commonly known as:  CLIMARA - Dosed in mg/24 hr  Place 1 patch onto the skin once a week. Sunday     HYDROcodone-acetaminophen 7.5-500 MG/15ML solution  Commonly known as:  LORTAB  Take 15 mLs by mouth every 6  (six) hours as needed for pain.     levothyroxine 137 MCG tablet  Commonly known as:  SYNTHROID, LEVOTHROID  Take 137 mcg by mouth daily before breakfast. EXCEPT Sunday - PT TAKES 1/2 TAB     losartan 50 MG tablet  Commonly known as:  COZAAR  Take 25 mg by mouth 2 (two) times a week. Monday and Wednesday     multivitamin with minerals Tabs tablet  Take 1 tablet by mouth daily.     multivitamin-lutein Caps capsule  Take 1 capsule by mouth daily.     Vitamin D (Ergocalciferol) 50000 UNITS Caps capsule  Commonly known as:  DRISDOL  Take 50,000 Units by mouth every 14 (fourteen) days.           Follow-up Information   Follow up with Johnathan Hausen B, MD. Schedule an appointment as soon as possible for a visit in 2 weeks.   Specialty:  General Surgery   Contact information:   [address] [place] [address] [phone]       Signed: Pedro Earls 02/01/2014, 7:49 AM

## 2014-02-04 ENCOUNTER — Ambulatory Visit (INDEPENDENT_AMBULATORY_CARE_PROVIDER_SITE_OTHER): Payer: PRIVATE HEALTH INSURANCE | Admitting: Surgery

## 2014-02-04 ENCOUNTER — Encounter (INDEPENDENT_AMBULATORY_CARE_PROVIDER_SITE_OTHER): Payer: Self-pay | Admitting: Surgery

## 2014-02-04 VITALS — BP 124/80 | HR 80 | Temp 97.7°F | Resp 14 | Ht 63.0 in | Wt 190.0 lb

## 2014-02-04 DIAGNOSIS — Z9889 Other specified postprocedural states: Secondary | ICD-10-CM

## 2014-02-04 DIAGNOSIS — Z09 Encounter for follow-up examination after completed treatment for conditions other than malignant neoplasm: Secondary | ICD-10-CM

## 2014-02-04 NOTE — Progress Notes (Signed)
Theresa Holmes 73 y.o.  Body mass index is 33.67 kg/(m^2).  Patient Active Problem List   Diagnosis Date Noted  . Repair recurrent hiatus hernia and Nissen fundoplication over 56 dilator Feb 2015 01/21/2014  . Chest pain 05/13/2012  . GERD (gastroesophageal reflux disease) 02/04/2012  . Benign hypertensive heart disease without heart failure 10/02/2011  . Pure hypercholesterolemia 10/02/2011  . Headache 10/02/2011    No Known Allergies  Past Surgical History  Procedure Laterality Date  . Abdominal hysterectomy  1985  . Tonsillectomy and adenoidectomy      as a child  . Hernia repair  2004  . Varicose vein surgery  1977  . Upper endoscopy w/ sclerotherapy  2000  . Hammer toe surgery  2002  . Cholecystectomy    . Rotator cuff repair  2001  . Nissen fundoplication    . Kidney stone surgery  2002  . Thyroidectomy    . Spinal fusion  2005    L4  & L5  . Breast biopsy  9/05    stereotactic  . Combined augmentation mammaplasty and abdominoplasty  1977  . Eye surgery  12/05    retinal tear-left  . Rotator cuff repair  1/12     left  . Rotator cuff repair  5/13     open  . Hiatal hernia repair N/A 01/21/2014    Procedure: LAPAROSCOPIC REPAIR RECURRENT  HIATAL HERNIA REDO NISSEN, upper endoscopy;  Surgeon: Pedro Earls, MD;  Location: [facility];  Service: General;  Laterality: N/A;   Sheela Stack, MD No diagnosis found.  Doing very well thus far. She is taking a pured diet and his regaining some strength. She's going to [place] to spent some time with her daughter and grandchildren. She saw a nutritionist who helped her some proteins were tied and she is beginning to regain more strength. She is swallowing better and not having any reflux symptoms and is currently off Nexium.  I told her she could advance her diet and had some solid starting around 4 weeks beginning with flaky fish and then gradually returned to a regular diet. I'll see her back in 6 weeks. Matt B.  Hassell Done, MD, [facility] [phone] beeper [phone]  02/04/2014 4:06 PM

## 2014-03-16 ENCOUNTER — Encounter (INDEPENDENT_AMBULATORY_CARE_PROVIDER_SITE_OTHER): Payer: PRIVATE HEALTH INSURANCE | Admitting: Surgery

## 2014-04-15 ENCOUNTER — Encounter (INDEPENDENT_AMBULATORY_CARE_PROVIDER_SITE_OTHER): Payer: PRIVATE HEALTH INSURANCE | Admitting: Surgery

## 2014-04-20 ENCOUNTER — Ambulatory Visit (INDEPENDENT_AMBULATORY_CARE_PROVIDER_SITE_OTHER): Payer: PRIVATE HEALTH INSURANCE | Admitting: Ophthalmology

## 2014-04-20 DIAGNOSIS — H43819 Vitreous degeneration, unspecified eye: Secondary | ICD-10-CM

## 2014-04-20 DIAGNOSIS — H2510 Age-related nuclear cataract, unspecified eye: Secondary | ICD-10-CM

## 2014-04-20 DIAGNOSIS — H35039 Hypertensive retinopathy, unspecified eye: Secondary | ICD-10-CM

## 2014-04-20 DIAGNOSIS — H33309 Unspecified retinal break, unspecified eye: Secondary | ICD-10-CM

## 2014-04-20 DIAGNOSIS — I10 Essential (primary) hypertension: Secondary | ICD-10-CM

## 2014-05-05 ENCOUNTER — Encounter (INDEPENDENT_AMBULATORY_CARE_PROVIDER_SITE_OTHER): Payer: PRIVATE HEALTH INSURANCE | Admitting: Surgery

## 2014-08-10 ENCOUNTER — Ambulatory Visit: Payer: PRIVATE HEALTH INSURANCE | Admitting: Obstetrics & Gynecology

## 2014-08-22 ENCOUNTER — Ambulatory Visit: Payer: PRIVATE HEALTH INSURANCE | Admitting: Obstetrics & Gynecology

## 2014-09-07 ENCOUNTER — Other Ambulatory Visit: Payer: Self-pay

## 2014-09-07 MED ORDER — ESTRADIOL 0.025 MG/24HR TD PTWK: 0.0250 mg | MEDICATED_PATCH | TRANSDERMAL | Status: DC

## 2014-09-07 NOTE — Telephone Encounter (Signed)
Last AEX: 08/05/13 Last refill:08/05/13 -1 year Current AEX:11/14/14 Last MMG: 08/08/14 Bi-Rads Benign  Please advise

## 2014-09-26 ENCOUNTER — Encounter (INDEPENDENT_AMBULATORY_CARE_PROVIDER_SITE_OTHER): Payer: Self-pay | Admitting: Surgery

## 2014-10-11 ENCOUNTER — Telehealth: Payer: Self-pay | Admitting: Obstetrics & Gynecology

## 2014-10-11 NOTE — Telephone Encounter (Signed)
lmtcb to reschedule AEX with Dr. Sabra Heck for 11/14/14.

## 2014-10-12 ENCOUNTER — Other Ambulatory Visit: Payer: Self-pay

## 2014-10-12 MED ORDER — VITAMIN D (ERGOCALCIFEROL) 1.25 MG (50000 UNIT) PO CAPS: 50000.0000 [IU] | ORAL_CAPSULE | ORAL | Status: DC

## 2014-10-12 NOTE — Telephone Encounter (Signed)
Incoming Refill Request from [facility] GS:[id number] D (50,000)  Last AEX:08/05/13 Last Refill:08/09/13 Next AEX:11/22/14 Last Vitamin D: 08/05/13, 57  Please Advise

## 2014-10-25 HISTORY — PX: CARPAL TUNNEL RELEASE: SHX101

## 2014-11-14 ENCOUNTER — Ambulatory Visit: Payer: PRIVATE HEALTH INSURANCE | Admitting: Obstetrics & Gynecology

## 2014-11-22 ENCOUNTER — Telehealth: Payer: Self-pay | Admitting: Obstetrics & Gynecology

## 2014-11-22 ENCOUNTER — Encounter: Payer: Self-pay | Admitting: Obstetrics & Gynecology

## 2014-11-22 ENCOUNTER — Ambulatory Visit: Payer: PRIVATE HEALTH INSURANCE | Admitting: Obstetrics & Gynecology

## 2014-11-22 NOTE — Telephone Encounter (Signed)
Thanks.  OK to close encounter.

## 2014-11-22 NOTE — Telephone Encounter (Signed)
Pt canceled appointment for annual today. She is keeping her seven grandchildren and three of them have lice. She did not want to cancel but thought it was not wise to come into the office. Pt states we canceled on her twice and now we do not have an appointment until 2017 for an annual with Dr. Sabra Heck. Offered appointment with another provider but patient declined and stated she can only see Dr. Sabra Heck. Patient wants to be added to the wait list for Dr. Sabra Heck.

## 2014-11-22 NOTE — Telephone Encounter (Signed)
FYI only--patient added to waitlist and will be called as soon as an opening is available and I reach her name on the list. Please advise if need to set this patient as a high priority.

## 2015-02-09 ENCOUNTER — Other Ambulatory Visit: Payer: Self-pay | Admitting: Obstetrics & Gynecology

## 2015-02-09 NOTE — Telephone Encounter (Signed)
Medication refill request: Climara 0.025 mg  Last AEX:  08/05/13 with SM Next AEX: 03/10/15 with SM Last MMG (if hormonal medication request): 08/08/14 Breast Density cat B; Bi-Rads 2: Benign Refill authorized: #4 patches/1 rfs, please advise.  Called patient to try and schedule AEX, patient says she has been rescheduled for her AEX. Scheduled patient for AEX with Dr. Sabra Heck for 03/10/15, please advise refills until then.

## 2015-03-10 ENCOUNTER — Ambulatory Visit (INDEPENDENT_AMBULATORY_CARE_PROVIDER_SITE_OTHER): Payer: No Typology Code available for payment source | Admitting: Obstetrics & Gynecology

## 2015-03-10 ENCOUNTER — Encounter: Payer: Self-pay | Admitting: Obstetrics & Gynecology

## 2015-03-10 VITALS — BP 138/74 | HR 56 | Resp 16 | Ht 61.5 in | Wt 204.8 lb

## 2015-03-10 DIAGNOSIS — N393 Stress incontinence (female) (male): Secondary | ICD-10-CM

## 2015-03-10 DIAGNOSIS — Z01419 Encounter for gynecological examination (general) (routine) without abnormal findings: Secondary | ICD-10-CM | POA: Diagnosis not present

## 2015-03-10 DIAGNOSIS — R350 Frequency of micturition: Secondary | ICD-10-CM | POA: Diagnosis not present

## 2015-03-10 MED ORDER — ESTRADIOL 0.025 MG/24HR TD PTWK: MEDICATED_PATCH | TRANSDERMAL | Status: DC

## 2015-03-10 MED ORDER — VITAMIN D (ERGOCALCIFEROL) 1.25 MG (50000 UNIT) PO CAPS: 50000.0000 [IU] | ORAL_CAPSULE | ORAL | Status: DC

## 2015-03-10 NOTE — Progress Notes (Signed)
74 y.o. G3P3 MarriedCaucasianF here for annual exam.  Doing well.  No vaginal bleeding.  Working on weight loss.  Biggest issue now is urinary freq  PCP:  Dr. Forde Dandy.  Last appt was 11/15.   Patient's last menstrual period was 11/26/1983.          Sexually active: Yes.    The current method of family planning is status post hysterectomy.  Exercising: Yes.    exercise bike 1-3 x weekly Smoker:  no  Health Maintenance: Pap:  08/04/00 WNL History of abnormal Pap:  no MMG:  08/08/14-normal Colonoscopy:  And endoscopy 2013-no repeat needed Dr Cristina Gong BMD:   05/25/13-normal TDaP:  2012 Screening Labs: PCP, Hb today: PCP, Urine today: PCP   reports that she has never smoked. She has never used smokeless tobacco. She reports that she drinks about 0.6 oz of alcohol per week. She reports that she does not use illicit drugs.  Past Medical History  Diagnosis Date  . Hypertension   . Hyperlipidemia   . Exogenous obesity   . Chronic back pain   . Spinal stenosis   . Hypercholesterolemia   . Thyroid mass     right, benign  . GERD (gastroesophageal reflux disease)   . H/O hiatal hernia   . Arthritis   . Shingles 11/2012  . Hypothyroidism     HX PARTIAL THYROIDECTOMY FOR  NODULE- BENIGN  . Renal stones   . Varicose vein     Past Surgical History  Procedure Laterality Date  . Abdominal hysterectomy  1985  . Tonsillectomy and adenoidectomy      as a child  . Hernia repair  2004  . Varicose vein surgery  1977  . Upper endoscopy w/ sclerotherapy  2000  . Hammer toe surgery  2002  . Cholecystectomy    . Rotator cuff repair  2001  . Nissen fundoplication    . Kidney stone surgery  2002  . Thyroidectomy    . Spinal fusion  2005    L4  & L5  . Breast biopsy  9/05    stereotactic  . Combined augmentation mammaplasty and abdominoplasty  1977  . Eye surgery  12/05    retinal tear-left  . Rotator cuff repair  1/12     left  . Rotator cuff repair  5/13     open  . Hiatal hernia repair  N/A 01/21/2014    Procedure: LAPAROSCOPIC REPAIR RECURRENT  HIATAL HERNIA REDO NISSEN, upper endoscopy;  Surgeon: Pedro Earls, MD;  Location: [facility];  Service: General;  Laterality: N/A;  . Carpal tunnel release  12/15    right wrist    Current Outpatient Prescriptions  Medication Sig Dispense Refill  . acetaminophen (TYLENOL) 500 MG tablet Take 500 mg by mouth. Taking 2 daily    . B Complex-C (B-COMPLEX WITH VITAMIN C) tablet Take 1 tablet by mouth daily.    Marland Kitchen esomeprazole (NEXIUM) 40 MG capsule Take 40 mg by mouth daily before breakfast.     . estradiol (CLIMARA - DOSED IN MG/24 HR) 0.025 mg/24hr patch apply 1 patch ONTO THE SKIN every Sunday 4 patch 1  . levothyroxine (SYNTHROID, LEVOTHROID) 137 MCG tablet Take 137 mcg by mouth daily before breakfast. EXCEPT Sunday - PT TAKES 1/2 TAB    . Multiple Vitamin (MULTIVITAMIN WITH MINERALS) TABS Take 1 tablet by mouth daily.    . multivitamin-lutein (OCUVITE-LUTEIN) CAPS Take 1 capsule by mouth daily.    . Vitamin D, Ergocalciferol, (DRISDOL)  50000 UNITS CAPS capsule Take 1 capsule (50,000 Units total) by mouth every 14 (fourteen) days. 30 capsule 0  . CELEBREX 200 MG capsule Take 200 mg by mouth daily. rarely  1   No current facility-administered medications for this visit.    Family History  Problem Relation Age of Onset  . Kidney failure Father     complete shut down  . Pulmonary embolism Mother   . Heart failure Father   . Breast cancer Maternal Grandmother 80  . Pulmonary embolism Mother 51    post partum    ROS:  Pertinent items are noted in HPI.  Otherwise, a comprehensive ROS was negative.  Exam:   BP 138/74 mmHg  Pulse 56  Resp 16  Ht 5' 1.5" (1.562 m)  Wt 204 lb 12.8 oz (92.897 kg)  BMI 38.07 kg/m2  LMP 11/26/1983    Height: 5' 1.5" (156.2 cm)  Ht Readings from Last 3 Encounters:  03/10/15 5' 1.5" (1.562 m)  02/04/14 5\' 3"  (1.6 m)  01/21/14 5\' 3"  (1.6 m)    General appearance: alert, cooperative and appears  stated age Head: Normocephalic, without obvious abnormality, atraumatic Neck: no adenopathy, supple, symmetrical, trachea midline and thyroid normal to inspection and palpation Lungs: clear to auscultation bilaterally Breasts: normal appearance, no masses or tenderness Heart: regular rate and rhythm Abdomen: soft, non-tender; bowel sounds normal; no masses,  no organomegaly Extremities: extremities normal, atraumatic, no cyanosis or edema Skin: Skin color, texture, turgor normal. No rashes or lesions Lymph nodes: Cervical, supraclavicular, and axillary nodes normal. No abnormal inguinal nodes palpated Neurologic: Grossly normal   Pelvic: External genitalia:  no lesions              Urethra:  normal appearing urethra with no masses, tenderness or lesions              Bartholins and Skenes: normal                 Vagina: normal appearing vagina with normal color and discharge, no lesions              Cervix: absent              Pap taken: No. Bimanual Exam:  Uterus:  uterus absent              Adnexa: no mass, fullness, tenderness               Rectovaginal: Confirms               Anus:  normal sphincter tone, no lesions  Chaperone was present for exam.  A:  Well Woman with normal exam PMP, on Low dose HRT Low Vit D Elevated lipids Hypothyroidism SUI and OAB symptoms Small ovarian cyst noted on PUS 9/14  P: Mammogram yearly pap smear not indicated rx for Vit D 50K every other week to pharmacy Referral for pelvic PT.  Pt may end up need rx for OAB as well and/or urologist evaluation Urine culture obtained today Rx for climara 0.025mg  patches weekly to pharmacy.  Pt will cut in 1/2 and try to wean off this year.  MI, stroke, DVT/PE, breast cancer risks discussed  return annually or prn

## 2015-03-12 LAB — URINE CULTURE
Colony Count: NO GROWTH
ORGANISM ID, BACTERIA: NO GROWTH

## 2015-03-17 ENCOUNTER — Telehealth: Payer: Self-pay | Admitting: Obstetrics & Gynecology

## 2015-03-17 NOTE — Telephone Encounter (Signed)
Left message for patient to call back. Need to advise of appointment at 2:45 on May 31 at [facility]

## 2015-03-27 NOTE — Telephone Encounter (Signed)
Patient returned call. She is aware of appointment. She has contacted [facility], cancelled the scheduled appt, and scheduled PT.

## 2015-04-25 ENCOUNTER — Ambulatory Visit (INDEPENDENT_AMBULATORY_CARE_PROVIDER_SITE_OTHER): Payer: No Typology Code available for payment source | Admitting: Ophthalmology

## 2015-04-25 DIAGNOSIS — I10 Essential (primary) hypertension: Secondary | ICD-10-CM | POA: Diagnosis not present

## 2015-04-25 DIAGNOSIS — H43813 Vitreous degeneration, bilateral: Secondary | ICD-10-CM

## 2015-04-25 DIAGNOSIS — H35033 Hypertensive retinopathy, bilateral: Secondary | ICD-10-CM | POA: Diagnosis not present

## 2015-04-25 DIAGNOSIS — H33303 Unspecified retinal break, bilateral: Secondary | ICD-10-CM | POA: Diagnosis not present

## 2016-04-24 ENCOUNTER — Ambulatory Visit (INDEPENDENT_AMBULATORY_CARE_PROVIDER_SITE_OTHER): Payer: No Typology Code available for payment source | Admitting: Ophthalmology

## 2016-04-25 ENCOUNTER — Ambulatory Visit (HOSPITAL_COMMUNITY)
Admission: RE | Admit: 2016-04-25 | Discharge: 2016-04-25 | Disposition: A | Discharge status: Home / Self Care | Payer: Managed Care, Other (non HMO) | Source: Ambulatory Visit | Attending: Endocrinology | Admitting: Endocrinology

## 2016-04-25 ENCOUNTER — Ambulatory Visit (INDEPENDENT_AMBULATORY_CARE_PROVIDER_SITE_OTHER): Payer: Managed Care, Other (non HMO) | Admitting: Ophthalmology

## 2016-04-25 ENCOUNTER — Ambulatory Visit
Admission: RE | Admit: 2016-04-25 | Discharge: 2016-04-25 | Disposition: A | Discharge status: Home / Self Care | Payer: Managed Care, Other (non HMO) | Source: Ambulatory Visit | Attending: Orthopedic Surgery | Admitting: Orthopedic Surgery

## 2016-04-25 ENCOUNTER — Other Ambulatory Visit: Payer: Self-pay | Admitting: Orthopedic Surgery

## 2016-04-25 ENCOUNTER — Other Ambulatory Visit (HOSPITAL_COMMUNITY): Payer: Self-pay | Admitting: Orthopedic Surgery

## 2016-04-25 DIAGNOSIS — R52 Pain, unspecified: Secondary | ICD-10-CM

## 2016-04-25 DIAGNOSIS — H43813 Vitreous degeneration, bilateral: Secondary | ICD-10-CM

## 2016-04-25 DIAGNOSIS — H2513 Age-related nuclear cataract, bilateral: Secondary | ICD-10-CM | POA: Diagnosis not present

## 2016-04-25 DIAGNOSIS — H33021 Retinal detachment with multiple breaks, right eye: Secondary | ICD-10-CM

## 2016-04-25 DIAGNOSIS — H35033 Hypertensive retinopathy, bilateral: Secondary | ICD-10-CM

## 2016-04-25 DIAGNOSIS — I10 Essential (primary) hypertension: Secondary | ICD-10-CM

## 2016-04-25 DIAGNOSIS — H33303 Unspecified retinal break, bilateral: Secondary | ICD-10-CM | POA: Diagnosis not present

## 2016-04-25 DIAGNOSIS — H35371 Puckering of macula, right eye: Secondary | ICD-10-CM

## 2016-04-25 DIAGNOSIS — M7989 Other specified soft tissue disorders: Secondary | ICD-10-CM | POA: Diagnosis not present

## 2016-04-25 MED ORDER — IOPAMIDOL (ISOVUE-370) INJECTION 76%
100.0000 mL | Freq: Once | INTRAVENOUS | Status: AC | PRN
  Administered 2016-04-25: 100 mL via INTRAVENOUS

## 2016-04-25 NOTE — Progress Notes (Signed)
VASCULAR LAB PRELIMINARY  PRELIMINARY  PRELIMINARY  PRELIMINARY  Bilateral lower extremity venous duplex completed.     Bilateral:  No evidence of DVT, superficial thrombosis, or Baker's Cyst.  Called doctor's office@5 :15 pm gave result to Delia.  Janifer Adie, RVT, RDMS 04/25/2016, 5:13 PM

## 2016-04-26 ENCOUNTER — Encounter: Payer: Self-pay | Admitting: Internal Medicine

## 2016-04-26 ENCOUNTER — Ambulatory Visit (INDEPENDENT_AMBULATORY_CARE_PROVIDER_SITE_OTHER): Payer: Managed Care, Other (non HMO) | Admitting: Internal Medicine

## 2016-04-26 VITALS — BP 132/80 | HR 76 | Ht 62.0 in | Wt 177.4 lb

## 2016-04-26 DIAGNOSIS — R918 Other nonspecific abnormal finding of lung field: Secondary | ICD-10-CM | POA: Diagnosis not present

## 2016-04-26 MED ORDER — AMOXICILLIN-POT CLAVULANATE 875-125 MG PO TABS: 1.0000 | ORAL_TABLET | Freq: Two times a day (BID) | ORAL | Status: DC

## 2016-04-26 NOTE — Progress Notes (Signed)
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 Pulmonary Consult    Self referral for abn CT Chest.  Pt having some LUQ pain, esp worse when she takes a deep breath or bends.  onset of pain was abrupt with rad to L shoulder and pleuritic quality than has eased up the last few days but noted much worse lying back initiall and  doe such that every 3 gates at [facility] airport 04/24/16 and still uncomfortable with more than slow adls.  No cough fever or chills.  No obvious day to day or daytime variability or assoc chronic cough or cp or chest tightness, subjective wheeze or overt sinus or hb symptoms. No unusual exp hx or h/o childhood pna/ asthma or knowledge of premature birth.  Sleeping ok without nocturnal  or early am exacerbation  of respiratory  c/o's or need for noct saba. Also denies any obvious fluctuation of symptoms with weather or environmental changes or other aggravating or alleviating factors except as outlined above   Current Medications, Allergies, Complete Past Medical History, Past Surgical History, Family History, and Social History were reviewed in [employer] record.  ROS  The following are not active complaints unless bolded sore throat, dysphagia, dental problems, itching, sneezing,  nasal congestion or excess/ purulent secretions, ear ache,   fever, chills, sweats, unintended wt loss, classically pleuritic or exertional  cp, hemoptysis,  orthopnea pnd or leg swelling, presyncope, palpitations, abdominal pain, anorexia, nausea, vomiting, diarrhea  or change in bowel or bladder habits, change in stools or urine, dysuria,hematuria,  rash, arthralgias, visual complaints, headache, numbness, weakness or ataxia or problems with walking or coordination,  change in mood/affect or memory.                   Review of Systems     Objective:   Physical Exam Very pleasant amb wf nad  Wt Readings from Last 3 Encounters:  04/26/16 177 lb 6.4 oz (80.468 kg)  03/10/15 204 lb 12.8 oz (92.897 kg)  02/04/14 190 lb (86.183 kg)    Vital signs reviewed  HEENT: nl dentition, turbinates, and oropharynx. Nl external ear canals without cough reflex   NECK :  without JVD/Nodes/TM/ nl carotid upstrokes bilaterally   LUNGS: no acc muscle use,  Nl contour chest which is clear to A and P bilaterally without cough on insp or exp maneuvers   CV:  RRR  no s3 or murmur or increase in P2, no edema   ABD:  soft and nontender with nl inspiratory excursion in the supine position. No bruits or organomegaly, bowel sounds nl  MS:  Nl gait/ ext warm without deformities, calf tenderness, cyanosis or clubbing No obvious joint restrictions   SKIN: warm and dry without lesions    NEURO:  alert, approp, nl sensorium with  no motor deficits  I personally reviewed images and agree with radiology impression as follows:  CTa Chest   04/25/16    1. No evidence of acute pulmonary embolism. 2. Opacity within the left lower lobe peripherally with air bronchograms coursing through it. This is most likely due to atelectasis although developing pneumonia cannot be excluded. 3. Small left pleural effusion. 4. Small to moderate size hiatal hernia  Labs reviewed 04/26/2016  ESR 04/25/16 = 18  Venous dopplers 04/25/16 neg bilaterally   D dimer 04/25/16  Pos at 1.32   Wbc 04/22/16   5.8 s Left shift     Assessment:

## 2016-04-26 NOTE — Patient Instructions (Addendum)
Augmentin 875 mg take one pill twice daily  X 10 days - take at breakfast and supper with large glass of water.  It would help reduce the usual side effects (diarrhea and yeast infections) if you ate cultured yogurt at lunch.   Return in one week for cxr call sooner if needed

## 2016-04-27 DIAGNOSIS — R918 Other nonspecific abnormal finding of lung field: Secondary | ICD-10-CM | POA: Insufficient documentation

## 2016-04-27 NOTE — Assessment & Plan Note (Signed)
Onset 04/22/16 p heavy late meal in setting of known GERD with air bronchograms on CTa 04/25/16  She doesn't have classic hx for pna but most likely experienced an aspiration event assoc with intense pleurisy and is at risk for empyema from asp pna so reasonable to rx with 10 days of augmentin then regroup with cxr   Single peripheral PE under consideration in ddx based on travel hx and slt elevation of d dimer but is not assoc with air bronchograms and the venous dopplers are neg so very unlikely.  Discussed in detail all the  indications, usual  risks and alternatives  relative to the benefits with patient /Dr Gladstone Lighter who agree  to proceed with conservative f/u as outlined    Total time devoted to counseling  = 35/13m review case with pt/ discussion of options/alternatives/ personally creating written instructions  in presence of pt  then going over those specific  Instructions directly with the pt including how to use all of the meds but in particular covering each new medication in detail and the difference between the maintenance/automatic meds and the prns using an action plan format for the latter.

## 2016-05-02 ENCOUNTER — Telehealth: Payer: Self-pay | Admitting: *Deleted

## 2016-05-02 ENCOUNTER — Ambulatory Visit (INDEPENDENT_AMBULATORY_CARE_PROVIDER_SITE_OTHER): Payer: Managed Care, Other (non HMO) | Admitting: Ophthalmology

## 2016-05-02 NOTE — Telephone Encounter (Signed)
Called patient and gave her BMD results. Patient verbalized understanding.

## 2016-05-03 ENCOUNTER — Ambulatory Visit (INDEPENDENT_AMBULATORY_CARE_PROVIDER_SITE_OTHER): Payer: Managed Care, Other (non HMO) | Admitting: Internal Medicine

## 2016-05-03 ENCOUNTER — Other Ambulatory Visit: Payer: Self-pay | Admitting: Internal Medicine

## 2016-05-03 ENCOUNTER — Ambulatory Visit: Payer: Managed Care, Other (non HMO) | Admitting: Internal Medicine

## 2016-05-03 ENCOUNTER — Encounter: Payer: Self-pay | Admitting: Internal Medicine

## 2016-05-03 ENCOUNTER — Ambulatory Visit (INDEPENDENT_AMBULATORY_CARE_PROVIDER_SITE_OTHER)
Admission: RE | Admit: 2016-05-03 | Discharge: 2016-05-03 | Disposition: A | Discharge status: Home / Self Care | Payer: Managed Care, Other (non HMO) | Source: Ambulatory Visit | Attending: Internal Medicine | Admitting: Internal Medicine

## 2016-05-03 VITALS — BP 122/80 | HR 72 | Ht 62.0 in | Wt 171.0 lb

## 2016-05-03 DIAGNOSIS — R918 Other nonspecific abnormal finding of lung field: Secondary | ICD-10-CM

## 2016-05-03 NOTE — Assessment & Plan Note (Addendum)
Onset 04/22/16 p heavy late meal in setting of known GERD with air bronchograms on CTa 04/25/16 - rx rocephin x one dose 04/22/16,  rx augmentin 04/26/2016 x 10 days > resolved 05/03/2016   I had an extended final summary discussion with the patient reviewing all relevant studies completed to date and  lasting 15 to 20 minutes of a 25 minute visit on the following issues:    Clearly this was an inflammatory process, most c/w aspiration related to gerd and late night meals, no evidence of sign pna or assoc empyema at this point, so f/u can be prn   Advised on gerd diet

## 2016-05-03 NOTE — Patient Instructions (Signed)
Finish up all your antibiotics   Return as needed

## 2016-05-03 NOTE — Progress Notes (Signed)
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 Pulmonary Consult    Self referral for abn CT Chest.  Pt having some LUQ pain, esp worse when she takes a deep breath or bends.  onset of pain was abrupt with rad to L shoulder and pleuritic quality than has eased up the last few days but noted much worse lying back initially and  doe such that every 3 gates at [facility] airport 04/24/16 and still uncomfortable with more than slow adls.  No cough fever or chills. rec Augmentin 875 mg take one pill twice daily  X 10 days   05/03/2016  f/u ov/[name] re: prob asp pna Chief Complaint  Patient presents with  . Follow-up    Much improved, no new co's today   Able to lie back ok s cp/sob/ never coughed up anything/ no fever Still some fatigue with yardwork   No obvious day to day or daytime variability or assoc   chest tightness, subjective wheeze or overt sinus or hb symptoms. No unusual exp hx or h/o childhood pna/ asthma or knowledge of premature birth.  Sleeping ok without nocturnal  or early am exacerbation  of respiratory  c/o's or need for noct saba. Also denies any obvious fluctuation of symptoms with weather or environmental changes or other aggravating or alleviating factors except as outlined above   Current Medications, Allergies, Complete Past Medical History, Past Surgical History, Family History, and Social History were reviewed in [employer] record.  ROS  The following are not active complaints unless bolded sore throat, dysphagia, dental problems, itching, sneezing,  nasal congestion or excess/ purulent secretions, ear ache,   fever, chills, sweats, unintended wt loss, classically pleuritic or exertional cp, hemoptysis,  orthopnea pnd or leg swelling, presyncope, palpitations, abdominal pain, anorexia, nausea, vomiting, diarrhea  or change in bowel or bladder habits, change in stools or urine, dysuria,hematuria,  rash, arthralgias, visual complaints, headache, numbness, weakness or ataxia or problems with walking or coordination,  change in mood/affect or memory.                         Objective:   Physical Exam   Very pleasant amb wf nad - all smiles   05/03/2016         171   04/26/16 177 lb 6.4 oz (80.468 kg)  03/10/15 204 lb 12.8 oz (92.897 kg)  02/04/14 190 lb (86.183 kg)    Vital signs reviewed  HEENT: nl dentition, turbinates, and oropharynx. Nl external ear canals without cough reflex   NECK :  without JVD/Nodes/TM/ nl carotid upstrokes bilaterally   LUNGS: no acc muscle use,  Nl contour chest which is clear to A and P bilaterally without cough on insp or exp maneuvers   CV:  RRR  no s3 or murmur  or increase in P2, no edema   ABD:  soft and nontender with nl inspiratory excursion in the supine position. No bruits or organomegaly, bowel sounds nl  MS:  Nl gait/ ext warm without deformities, calf tenderness, cyanosis or clubbing No obvious joint restrictions   SKIN: warm and dry without lesions    NEURO:  alert, approp, nl sensorium with  no motor deficits    CXR PA and Lateral:   05/03/2016 :    I personally reviewed images and agree with radiology impression as follows:    Persistent streaky airspace opacities in the left lower lobe with small left pleural effusion or pleural thickening. My impression: dramatic and convincing improvement since study 04/25/16 with very very minimal residual     Assessment:

## 2016-05-03 NOTE — Progress Notes (Signed)
Quick Note:  Spoke with pt and notified of results per Dr. Wert. Pt verbalized understanding and denied any questions.  ______ 

## 2016-05-05 ENCOUNTER — Encounter: Payer: Self-pay | Admitting: Internal Medicine

## 2016-05-10 ENCOUNTER — Encounter (INDEPENDENT_AMBULATORY_CARE_PROVIDER_SITE_OTHER): Payer: Managed Care, Other (non HMO) | Admitting: Ophthalmology

## 2016-05-10 DIAGNOSIS — H33021 Retinal detachment with multiple breaks, right eye: Secondary | ICD-10-CM

## 2016-05-15 ENCOUNTER — Encounter: Payer: Self-pay | Admitting: Obstetrics & Gynecology

## 2016-05-17 ENCOUNTER — Encounter: Payer: Self-pay | Admitting: Obstetrics & Gynecology

## 2016-05-17 ENCOUNTER — Ambulatory Visit (INDEPENDENT_AMBULATORY_CARE_PROVIDER_SITE_OTHER): Payer: Managed Care, Other (non HMO) | Admitting: Obstetrics & Gynecology

## 2016-05-17 VITALS — BP 112/66 | HR 66 | Resp 14 | Ht 61.25 in | Wt 167.2 lb

## 2016-05-17 DIAGNOSIS — Z0000 Encounter for general adult medical examination without abnormal findings: Secondary | ICD-10-CM

## 2016-05-17 DIAGNOSIS — Z01419 Encounter for gynecological examination (general) (routine) without abnormal findings: Secondary | ICD-10-CM | POA: Diagnosis not present

## 2016-05-17 LAB — POCT URINALYSIS DIPSTICK
Bilirubin, UA: NEGATIVE
Blood, UA: NEGATIVE
GLUCOSE UA: NEGATIVE
KETONES UA: NEGATIVE
LEUKOCYTES UA: NEGATIVE
Nitrite, UA: NEGATIVE
Protein, UA: NEGATIVE
UROBILINOGEN UA: NEGATIVE
pH, UA: 5

## 2016-05-17 NOTE — Patient Instructions (Signed)
Double check with Dr. Forde Dandy about how many pneumonia vaccines you've had

## 2016-05-17 NOTE — Progress Notes (Signed)
75 y.o. G3P3 Married CaucasianF here for annual exam.  Doing well.  Reports she was recently in [place] for her granddaughter's eighth grade graduation.  Had LUQ pain that was so severe that she went to the ER.  She had a CT scan.  Fluid was seen in her lungs.  Levoquin was given.  Followed up with Dr. Melvyn Novas.  Diagnosed with a pneumonia and treated with augmentin for a week.  She is feeling much, much better.  Has had follow-up with Dr. Melvyn Novas.  Reviewed CT scan through mychart at [facility].    Denies vaginal bleeding.  Has had purposeful weight loss.  Met with a nutritionist, Genelle Gather.  This really helped.    Celebrating 51th anniversary this year.     Patient's last menstrual period was 11/26/1983.          Sexually active: Yes.    The current method of family planning is post menopausal status.    Exercising: Yes.    stationary bike Smoker:  no  Health Maintenance: Pap:  2001 negative History of abnormal Pap:  no MMG:  04/04/2016 BIRADS 1 negative Colonoscopy:  2013 with Buccini, negative.  Follow up was not recommended. BMD:   04/04/2016 normal  TDaP:  2012 Pneumonia vaccine(s):  ~5 years ago  Zostavax:   ~7 years ago  Hep C testing: not indicated Screening Labs: PCP,  Hb today: PCP, Urine today: normal    reports that she has never smoked. She has never used smokeless tobacco. She reports that she drinks about 0.6 oz of alcohol per week. She reports that she does not use illicit drugs.  Past Medical History  Diagnosis Date  . Hypertension   . Hyperlipidemia   . Exogenous obesity   . Chronic back pain   . Spinal stenosis   . Hypercholesterolemia   . Thyroid mass     right, benign  . GERD (gastroesophageal reflux disease)   . H/O hiatal hernia   . Arthritis   . Shingles 11/2012  . Hypothyroidism     HX PARTIAL THYROIDECTOMY FOR  NODULE- BENIGN  . Renal stones   . Varicose vein     Past Surgical History  Procedure Laterality Date  . Abdominal  hysterectomy  1985  . Tonsillectomy and adenoidectomy      as a child  . Hernia repair  2004  . Varicose vein surgery  1977  . Upper endoscopy w/ sclerotherapy  2000  . Hammer toe surgery  2002  . Cholecystectomy    . Rotator cuff repair  2001  . Nissen fundoplication    . Kidney stone surgery  2002  . Thyroidectomy    . Spinal fusion  2005    L4  & L5  . Breast biopsy  9/05    stereotactic  . Combined augmentation mammaplasty and abdominoplasty  1977  . Eye surgery  12/05    retinal tear-left  . Rotator cuff repair  1/12     left  . Rotator cuff repair  5/13     open  . Hiatal hernia repair N/A 01/21/2014    Procedure: LAPAROSCOPIC REPAIR RECURRENT  HIATAL HERNIA REDO NISSEN, upper endoscopy;  Surgeon: Pedro Earls, MD;  Location: [facility];  Service: General;  Laterality: N/A;  . Carpal tunnel release  12/15    right wrist    Current Outpatient Prescriptions  Medication Sig Dispense Refill  . b complex vitamins tablet Take 1 tablet by mouth daily.    Marland Kitchen  BIOTIN PO Take 1 tablet by mouth daily.    . Cholecalciferol (VITAMIN D) 2000 units CAPS Take 1 capsule by mouth daily.    . Coenzyme Q10 (COQ10 PO) Take 300 mg by mouth daily.    Marland Kitchen levothyroxine (SYNTHROID, LEVOTHROID) 137 MCG tablet Take 137 mcg by mouth daily before breakfast.    . LUTEIN PO Take 1 tablet by mouth daily.    . Probiotic Product (PROBIOTIC-10 PO) Take 1 tablet by mouth daily.    Marland Kitchen VITAMIN A PO Take 4,000 mcg by mouth daily.    . Zinc 30 MG TABS Take 1 tablet by mouth daily.     No current facility-administered medications for this visit.    Family History  Problem Relation Age of Onset  . Kidney failure Father     complete shut down  . Pulmonary embolism Mother   . Heart failure Father   . Breast cancer Maternal Grandmother 80  . Pulmonary embolism Mother 95    post partum    ROS:  Pertinent items are noted in HPI.  Otherwise, a comprehensive ROS was negative.  Exam:   BP 112/66 mmHg  Pulse  66  Resp 14  Ht 5' 1.25" (1.556 m)  Wt 167 lb 3.2 oz (75.841 kg)  BMI 31.32 kg/m2  LMP 11/26/1983  Weight change: -37#   Height: 5' 1.25" (155.6 cm)  Ht Readings from Last 3 Encounters:  05/17/16 5' 1.25" (1.556 m)  05/03/16 '5\' 2"'$  (1.575 m)  04/26/16 '5\' 2"'$  (1.575 m)   General appearance: alert, cooperative and appears stated age Head: Normocephalic, without obvious abnormality, atraumatic Neck: no adenopathy, supple, symmetrical, trachea midline and thyroid normal to inspection and palpation Lungs: clear to auscultation bilaterally Breasts: normal appearance, no masses or tenderness, bilateral implants Heart: regular rate and rhythm Abdomen: soft, non-tender; bowel sounds normal; no masses,  no organomegaly Extremities: extremities normal, atraumatic, no cyanosis or edema Skin: Skin color, texture, turgor normal. No rashes or lesions Lymph nodes: Cervical, supraclavicular, and axillary nodes normal. No abnormal inguinal nodes palpated Neurologic: Grossly normal   Pelvic: External genitalia:  no lesions              Urethra:  normal appearing urethra with no masses, tenderness or lesions              Bartholins and Skenes: normal                 Vagina: normal appearing vagina with normal color and discharge, no lesions              Cervix: absent              Pap taken: No. Bimanual Exam:  Uterus:  uterus absent              Adnexa: no mass, fullness, tenderness               Rectovaginal: Confirms               Anus:  normal sphincter tone, no lesions  Chaperone was present for exam.  A:  Well Woman with normal exam PMP, off HRT this past year Low Vit D Elevated lipids Hypothyroidism SUI and OAB symptoms, improved with weight loss Small ovarian cyst noted on PUS 9/14.  CT was negative 5/17 Recent pneumonia, much improved  P: Mammogram yearly pap smear not indicated Labs and vaccines with Dr. Forde Dandy.  She will check about whether she's hadboth vaccines Return  annually or prn

## 2016-06-03 ENCOUNTER — Encounter (INDEPENDENT_AMBULATORY_CARE_PROVIDER_SITE_OTHER): Payer: Managed Care, Other (non HMO) | Admitting: Ophthalmology

## 2016-07-11 ENCOUNTER — Encounter (INDEPENDENT_AMBULATORY_CARE_PROVIDER_SITE_OTHER): Payer: Managed Care, Other (non HMO) | Admitting: Ophthalmology

## 2016-07-11 DIAGNOSIS — H33301 Unspecified retinal break, right eye: Secondary | ICD-10-CM

## 2016-10-22 ENCOUNTER — Ambulatory Visit (INDEPENDENT_AMBULATORY_CARE_PROVIDER_SITE_OTHER): Payer: Managed Care, Other (non HMO) | Admitting: Cardiovascular Disease

## 2016-10-22 ENCOUNTER — Encounter: Payer: Self-pay | Admitting: Cardiovascular Disease

## 2016-10-22 VITALS — BP 118/82 | HR 80 | Ht 61.0 in | Wt 161.8 lb

## 2016-10-22 DIAGNOSIS — E7800 Pure hypercholesterolemia, unspecified: Secondary | ICD-10-CM

## 2016-10-22 DIAGNOSIS — R079 Chest pain, unspecified: Secondary | ICD-10-CM

## 2016-10-22 DIAGNOSIS — R0789 Other chest pain: Secondary | ICD-10-CM

## 2016-10-22 HISTORY — DX: Other chest pain: R07.89

## 2016-10-22 NOTE — Progress Notes (Signed)
Cardiology Office Note   Date:  10/22/2016   ID:  Theresa Holmes, DOB [birth year], MRN [id number]  PCP:  Sheela Stack, MD  Cardiologist:   Skeet Latch, MD   Chief Complaint  Patient presents with  . New Patient (Initial Visit)      History of Present Illness: Theresa Holmes is a 75 y.o. female with hyperlipidemia who presents for an evaluation of chest pain.  Theresa Holmes has noted occasional episodes of chest discomfort for the last month.  She reports feeling a discomfort that radiates from her epigastrum to her neck bilaterally.  The episodes occur once every few days and last for 5-10 minutes at a time.  It is worse at night when she is laying down, but she also has symptoms during the day.  She exercises on a stationary bike for 30 minutes several times per week.  She denies chest pain or shortness of breath with this activity.  She hasn't noted any lower extremity edema, orthopnea or PND. She has a history of GERD and underwent Nissen fundoplication twice (most recently 2015).  She hasn't been taking a PPI for a year because of side effects.  Through dietary changes, her reflux has been well-controlled.  She also has a history of TMJ.  Theresa Holmes was evaluated to chest pain in 2013.  She had an exercise Myoview at that time that was negative for ischemia.   Past Medical History:  Diagnosis Date  . Arthritis   . Atypical chest pain 10/22/2016  . Chronic back pain   . Exogenous obesity   . GERD (gastroesophageal reflux disease)   . H/O hiatal hernia   . Hypercholesterolemia   . Hyperlipidemia   . Hypertension   . Hypothyroidism    HX PARTIAL THYROIDECTOMY FOR  NODULE- BENIGN  . Renal stones   . Shingles 11/2012  . Spinal stenosis   . Thyroid mass    right, benign  . Varicose vein     Past Surgical History:  Procedure Laterality Date  . ABDOMINAL HYSTERECTOMY  1985  . BREAST BIOPSY  9/05   stereotactic  . CARPAL TUNNEL RELEASE  12/15   right wrist  .  CHOLECYSTECTOMY    . COMBINED AUGMENTATION MAMMAPLASTY AND ABDOMINOPLASTY  1977  . EYE SURGERY  12/05   retinal tear-left  . [place]  2002  . HERNIA REPAIR  2004  . HIATAL HERNIA REPAIR N/A 01/21/2014   Procedure: LAPAROSCOPIC REPAIR RECURRENT  HIATAL HERNIA REDO NISSEN, upper endoscopy;  Surgeon: Pedro Earls, MD;  Location: [facility];  Service: General;  Laterality: N/A;  . KIDNEY STONE SURGERY  2002  . NISSEN FUNDOPLICATION    . ROTATOR CUFF REPAIR  2001  . ROTATOR CUFF REPAIR  1/12    left  . ROTATOR CUFF REPAIR  5/13    open  . SPINAL FUSION  2005   L4  & L5  . THYROIDECTOMY    . TONSILLECTOMY AND ADENOIDECTOMY     as a child  . UPPER ENDOSCOPY W/ SCLEROTHERAPY  2000  . VARICOSE VEIN SURGERY  1977     Current Outpatient Prescriptions  Medication Sig Dispense Refill  . aspirin EC 81 MG tablet Take 81 mg by mouth daily.    Marland Kitchen b complex vitamins tablet Take 1 tablet by mouth daily.    Marland Kitchen BIOTIN PO Take 1 tablet by mouth daily.    . calcium & magnesium carbonates (MYLANTA) [id number] MG tablet Take 1 tablet  by mouth daily.    . Cholecalciferol (VITAMIN D) 2000 units CAPS Take 1 capsule by mouth daily.    . Coenzyme Q10 (COQ10 PO) Take 300 mg by mouth daily.    Marland Kitchen levothyroxine (SYNTHROID, LEVOTHROID) 137 MCG tablet Take 137 mcg by mouth daily before breakfast.    . LUTEIN PO Take 1 tablet by mouth daily.    . Potassium 75 MG TABS Take 1 tablet by mouth daily.    . Probiotic Product (PROBIOTIC-10 PO) Take 1 tablet by mouth daily.    Marland Kitchen VITAMIN A PO Take 4,000 mcg by mouth daily.    . Zinc 30 MG TABS Take 1 tablet by mouth daily.     No current facility-administered medications for this visit.     Allergies:   Patient has no known allergies.    Social History:  The patient  reports that she has never smoked. She has never used smokeless tobacco. She reports that she drinks about 0.6 oz of alcohol per week . She reports that she does not use drugs.   Family History:   The patient's family history includes Breast cancer (age of onset: 73) in her maternal grandmother; Heart failure in her father; Kidney failure in her father; Pulmonary embolism in her mother.    ROS:  Please see the history of present illness.   Otherwise, review of systems are positive for none.   All other systems are reviewed and negative.    PHYSICAL EXAM: VS:  BP 118/82 (BP Location: Left Arm)   Pulse 80   Ht 5\' 1"  (1.549 m)   Wt 73.4 kg (161 lb 12.8 oz)   LMP 11/26/1983   BMI 30.57 kg/m  , BMI Body mass index is 30.57 kg/m. GENERAL:  Well appearing HEENT:  Pupils equal round and reactive, fundi not visualized, oral mucosa unremarkable NECK:  No jugular venous distention, waveform within normal limits, carotid upstroke brisk and symmetric, no bruits, no thyromegaly LYMPHATICS:  No cervical adenopathy LUNGS:  Clear to auscultation bilaterally HEART:  RRR.  PMI not displaced or sustained,S1 and S2 within normal limits, no S3, no S4, no clicks, no rubs, no murmurs ABD:  Flat, positive bowel sounds normal in frequency in pitch, no bruits, no rebound, no guarding, no midline pulsatile mass, no hepatomegaly, no splenomegaly EXT:  2 plus pulses throughout, no edema, no cyanosis no clubbing SKIN:  No rashes no nodules NEURO:  Cranial nerves II through XII grossly intact, motor grossly intact throughout PSYCH:  Cognitively intact, oriented to person place and time   EKG:  EKG is ordered today. The ekg ordered today demonstrates sinus rhythm.  Rate 80 bpm.   05/20/12: Nuclear stress test- LVEF 75%.  No perfusion defects.  Recent Labs: No results found for requested labs within last 8760 hours.    Lipid Panel    Component Value Date/Time   CHOL 154 02/04/2012 0932   TRIG 116.0 02/04/2012 0932   HDL 58.60 02/04/2012 0932   CHOLHDL 3 02/04/2012 0932   VLDL 23.2 02/04/2012 0932   LDLCALC 72 02/04/2012 0932      Wt Readings from Last 3 Encounters:  10/22/16 73.4 kg (161 lb  12.8 oz)  05/17/16 75.8 kg (167 lb 3.2 oz)  05/03/16 77.6 kg (171 lb)      ASSESSMENT AND PLAN:  # Atypical chest pain:  Theresa Holmes's symptoms are very atypical.  Given that she can exercise without symptoms, I suspect that her chest pain is not cardiac.  However, we will obtain an exercise Myoview to better evaluate.  I suspect that it could be related to GERD given that she is off her PPI and symptoms are worse at night.   # Hyperlipidemia: ASCVD 10 year risk is 12.6%.  She prefers to work on continued diet and exercise prior to starting a statin.  She is willing to take aspirin 81mg  daily.   Current medicines are reviewed at length with the patient today.  The patient does not have concerns regarding medicines.  The following changes have been made:  Start aspirin 81 mg daily   Labs/ tests ordered today include:   Orders Placed This Encounter  Procedures  . Myocardial Perfusion Imaging  . EKG 12-Lead     Disposition:   FU with Hikari Tripp C. Oval Linsey, MD, [facility] in 3 months.     This note was written with the assistance of speech recognition software.  Please excuse any transcriptional errors.  Signed, Bryauna Byrum C. Oval Linsey, MD, [facility]  10/22/2016 3:11 PM    [facility]

## 2016-10-22 NOTE — Patient Instructions (Addendum)
Medication Instructions:  START ASPIRIN 81 MG DAILY  Labwork: FASTING LIPID PANEL FEW DAYS BEFORE FOLLOW UP OV  Testing/Procedures: Your physician has requested that you have en exercise stress myoview. For further information please visit [email or web address]. Please follow instruction sheet, as given.  Follow-Up: Your physician recommends that you schedule a follow-up appointment in: 3 MONTH OV   Any Other Special Instructions Will Be Listed Below (If Applicable). WORK ON DIET AND EXERCISE   If you need a refill on your cardiac medications before your next appointment, please call your pharmacy.

## 2016-10-25 ENCOUNTER — Telehealth (HOSPITAL_COMMUNITY): Payer: Self-pay

## 2016-10-25 NOTE — Telephone Encounter (Signed)
Pt to call me back with questions or concerns. Encounter complete.

## 2016-10-30 ENCOUNTER — Ambulatory Visit (HOSPITAL_COMMUNITY)
Admission: RE | Admit: 2016-10-30 | Discharge: 2016-10-30 | Disposition: A | Discharge status: Home / Self Care | Payer: Managed Care, Other (non HMO) | Source: Ambulatory Visit | Attending: Cardiovascular Disease | Admitting: Cardiovascular Disease

## 2016-10-30 DIAGNOSIS — R079 Chest pain, unspecified: Secondary | ICD-10-CM | POA: Diagnosis present

## 2016-10-30 LAB — MYOCARDIAL PERFUSION IMAGING
CHL CUP NUCLEAR SSS: 2
CHL CUP RESTING HR STRESS: 68 {beats}/min
CSEPED: 6 min
CSEPHR: 103 %
Estimated workload: 7 METS
Exercise duration (sec): 0 s
LVDIAVOL: 84 mL (ref 46–106)
LVSYSVOL: 33 mL
MPHR: 146 {beats}/min
NUC STRESS TID: 1.09
Peak HR: 151 {beats}/min
RPE: 16
SDS: 2
SRS: 0

## 2016-10-30 MED ORDER — TECHNETIUM TC 99M TETROFOSMIN IV KIT
10.4000 | PACK | Freq: Once | INTRAVENOUS | Status: AC | PRN
  Administered 2016-10-30: 10.4 via INTRAVENOUS
  Filled 2016-10-30: qty 11

## 2016-10-30 MED ORDER — TECHNETIUM TC 99M TETROFOSMIN IV KIT
32.0000 | PACK | Freq: Once | INTRAVENOUS | Status: AC | PRN
  Administered 2016-10-30: 32 via INTRAVENOUS
  Filled 2016-10-30: qty 32

## 2016-11-12 ENCOUNTER — Ambulatory Visit (INDEPENDENT_AMBULATORY_CARE_PROVIDER_SITE_OTHER): Payer: Managed Care, Other (non HMO) | Admitting: Ophthalmology

## 2016-11-12 DIAGNOSIS — H43813 Vitreous degeneration, bilateral: Secondary | ICD-10-CM | POA: Diagnosis not present

## 2016-11-12 DIAGNOSIS — I10 Essential (primary) hypertension: Secondary | ICD-10-CM

## 2016-11-12 DIAGNOSIS — H33303 Unspecified retinal break, bilateral: Secondary | ICD-10-CM

## 2016-11-12 DIAGNOSIS — H35033 Hypertensive retinopathy, bilateral: Secondary | ICD-10-CM | POA: Diagnosis not present

## 2017-01-22 ENCOUNTER — Encounter: Payer: Self-pay | Admitting: Cardiovascular Disease

## 2017-01-22 ENCOUNTER — Ambulatory Visit (INDEPENDENT_AMBULATORY_CARE_PROVIDER_SITE_OTHER): Payer: Managed Care, Other (non HMO) | Admitting: Cardiovascular Disease

## 2017-01-22 VITALS — BP 140/85 | HR 69 | Ht 61.0 in | Wt 158.2 lb

## 2017-01-22 DIAGNOSIS — E7800 Pure hypercholesterolemia, unspecified: Secondary | ICD-10-CM | POA: Diagnosis not present

## 2017-01-22 DIAGNOSIS — R030 Elevated blood-pressure reading, without diagnosis of hypertension: Secondary | ICD-10-CM

## 2017-01-22 MED ORDER — PRAVASTATIN SODIUM 10 MG PO TABS: 10.0000 mg | ORAL_TABLET | Freq: Every day | ORAL | 1 refills | Status: DC

## 2017-01-22 NOTE — Patient Instructions (Signed)
Medication Instructions:  START PRAVASTATIN 10 MG DAILY  Labwork: FASTING LP/CMET IN 6 WEEKS  Testing/Procedures: NONE  Follow-Up: Your physician recommends that you schedule a follow-up appointment in: 3 MONTH OFFICE VISIT   If you need a refill on your cardiac medications before your next appointment, please call your pharmacy.

## 2017-01-22 NOTE — Progress Notes (Signed)
Cardiology Office Note   Date:  01/22/2017   ID:  Theresa Holmes, DOB Mar 27, 1941, MRN [id number]  PCP:  Sheela Stack, MD  Cardiologist:   Skeet Latch, MD   Chief Complaint  Patient presents with  . Follow-up     Pt states no Sx.       History of Present Illness: Theresa Holmes is a 76 y.o. female with hyperlipidemia who presents for follow up.  She was initialy seen 09/2016 for chest pain that was atypical.  She was referred for exercise Myoview that revealed LVEF 61% and was negative for ischemia.  At that appointment her ASCVD 10 year risk was 12% but she was not interested in trying a statin.  Since that time she has started a high protein diet and lost several pounds.  The program recommends that you not exercise in this phase.  She had her lipids re-checked and the numbers were more elevated.  In the interim she found out that she needed a root canal. Since having thinks she has not had anymore jaw pain. She denies shortness of breath, lower extremity edema, orthopnea, or PND.  She checks her blood pressure at home and it typically runs in the 100s over 70s.   Past Medical History:  Diagnosis Date  . Arthritis   . Atypical chest pain 10/22/2016  . Chronic back pain   . Exogenous obesity   . GERD (gastroesophageal reflux disease)   . H/O hiatal hernia   . Hypercholesterolemia   . Hyperlipidemia   . Hypertension   . Hypothyroidism    HX PARTIAL THYROIDECTOMY FOR  NODULE- BENIGN  . Renal stones   . Shingles 11/2012  . Spinal stenosis   . Thyroid mass    right, benign  . Varicose vein     Past Surgical History:  Procedure Laterality Date  . ABDOMINAL HYSTERECTOMY  1985  . BREAST BIOPSY  9/05   stereotactic  . CARPAL TUNNEL RELEASE  12/15   right wrist  . CHOLECYSTECTOMY    . COMBINED AUGMENTATION MAMMAPLASTY AND ABDOMINOPLASTY  1977  . EYE SURGERY  12/05   retinal tear-left  . [place]  2002  . HERNIA REPAIR  2004  . HIATAL HERNIA REPAIR  N/A 01/21/2014   Procedure: LAPAROSCOPIC REPAIR RECURRENT  HIATAL HERNIA REDO NISSEN, upper endoscopy;  Surgeon: Pedro Earls, MD;  Location: [facility];  Service: General;  Laterality: N/A;  . KIDNEY STONE SURGERY  2002  . NISSEN FUNDOPLICATION    . ROTATOR CUFF REPAIR  2001  . ROTATOR CUFF REPAIR  1/12    left  . ROTATOR CUFF REPAIR  5/13    open  . SPINAL FUSION  2005   L4  & L5  . THYROIDECTOMY    . TONSILLECTOMY AND ADENOIDECTOMY     as a child  . UPPER ENDOSCOPY W/ SCLEROTHERAPY  2000  . VARICOSE VEIN SURGERY  1977     Current Outpatient Prescriptions  Medication Sig Dispense Refill  . aspirin EC 81 MG tablet Take 81 mg by mouth daily.    Marland Kitchen b complex vitamins tablet Take 1 tablet by mouth daily.    Marland Kitchen BIOTIN PO Take 1 tablet by mouth daily.    . calcium & magnesium carbonates (MYLANTA) [id number] MG tablet Take 1 tablet by mouth daily.    . Cholecalciferol (VITAMIN D) 2000 units CAPS Take 1 capsule by mouth daily.    . Coenzyme Q10 (COQ10 PO) Take 300 mg  by mouth daily.    Marland Kitchen levothyroxine (SYNTHROID, LEVOTHROID) 137 MCG tablet Take 137 mcg by mouth daily before breakfast.    . LUTEIN PO Take 1 tablet by mouth daily.    . montelukast (SINGULAIR) 10 MG tablet Take 10 mg by mouth at bedtime.    . Potassium 75 MG TABS Take 1 tablet by mouth daily.    . Probiotic Product (PROBIOTIC-10 PO) Take 1 tablet by mouth daily.    Marland Kitchen VITAMIN A PO Take 4,000 mcg by mouth daily.    . Zinc 30 MG TABS Take 1 tablet by mouth daily.    . pravastatin (PRAVACHOL) 10 MG tablet Take 1 tablet (10 mg total) by mouth daily. 90 tablet 1   No current facility-administered medications for this visit.     Allergies:   Patient has no known allergies.    Social History:  The patient  reports that she has never smoked. She has never used smokeless tobacco. She reports that she drinks about 0.6 oz of alcohol per week . She reports that she does not use drugs.   Family History:  The patient's family history  includes Breast cancer (age of onset: 30) in her maternal grandmother; Heart failure in her father; Kidney failure in her father; Pulmonary embolism in her mother.    ROS:  Please see the history of present illness.   Otherwise, review of systems are positive for none.   All other systems are reviewed and negative.    PHYSICAL EXAM: VS:  BP 140/85   Pulse 69   Ht 5\' 1"  (1.549 m)   Wt 71.8 kg (158 lb 3.2 oz)   LMP 11/26/1983   BMI 29.89 kg/m  , BMI Body mass index is 29.89 kg/m. GENERAL:  Well appearing HEENT:  Pupils equal round and reactive, fundi not visualized, oral mucosa unremarkable NECK:  No jugular venous distention, waveform within normal limits, carotid upstroke brisk and symmetric, no bruits LYMPHATICS:  No cervical adenopathy LUNGS:  Clear to auscultation bilaterally HEART:  RRR.  PMI not displaced or sustained,S1 and S2 within normal limits, no S3, no S4, no clicks, no rubs, no murmurs ABD:  Flat, positive bowel sounds normal in frequency in pitch, no bruits, no rebound, no guarding, no midline pulsatile mass, no hepatomegaly, no splenomegaly EXT:  2 plus pulses throughout, no edema, no cyanosis no clubbing SKIN:  No rashes no nodules NEURO:  Cranial nerves II through XII grossly intact, motor grossly intact throughout PSYCH:  Cognitively intact, oriented to person place and time   EKG:  EKG is not ordered today. 10/22/16 demonstrates sinus rhythm.  Rate 80 bpm.   05/20/12: Nuclear stress test- LVEF 75%.  No perfusion defects.  Exercise Myoview 10/30/16:  The left ventricular ejection fraction is normal (55-65%).  Nuclear stress EF: 61%.  There was no ST segment deviation noted during stress.  The study is normal.  This is a low risk study.  Recent Labs: No results found for requested labs within last 8760 hours.    Lipid Panel    Component Value Date/Time   CHOL 154 02/04/2012 0932   TRIG 116.0 02/04/2012 0932   HDL 58.60 02/04/2012 0932   CHOLHDL 3  02/04/2012 0932   VLDL 23.2 02/04/2012 0932   LDLCALC 72 02/04/2012 0932   09/30/16: Sodium 142, potassium 4.5, BUN 30, creatinine 0.8 AST 14, ALT 12 WBC 5.7, hemoglobin 13.7, hematocrit 42.0, platelets 258 Total cholesterol 213, triglycerides 111, HDL 54, LDL 137 TSH  0.1, free T4 1 0.3  01/20/17: Total cholesterol 245, triglycerides 107, HDL 66, LDL 158    Wt Readings from Last 3 Encounters:  01/22/17 71.8 kg (158 lb 3.2 oz)  10/30/16 73 kg (161 lb)  10/22/16 73.4 kg (161 lb 12.8 oz)      ASSESSMENT AND PLAN:  # Hyperlipidemia: ASCVD 10 year risk is >10%.  Her lipids have gotten worse as they were last checked 3 months ago. She is willing to try a statin. We will start pravastatin 10 mg daily. Lipids a comprehensive metabolic panel in 6 weeks. I encouraged her to start back exercising regularly. Continue aspirin 81 mg daily.  # Elevated BP:  Blood pressure was elevated in the office today. However, she checks it regularly at home and it is typically in the [id number] systolic. To monitor for now. It is likely that this is white coat hypertension.  Current medicines are reviewed at length with the patient today.  The patient does not have concerns regarding medicines.  The following changes have been made:  Start pravastatin 10 mg daily   Labs/ tests ordered today include:   No orders of the defined types were placed in this encounter.    Disposition:   FU with Spyros Winch C. Oval Linsey, MD, [facility] in 3 months.     This note was written with the assistance of speech recognition software.  Please excuse any transcriptional errors.  Signed, Tonnie Stillman C. Oval Linsey, MD, [facility]  01/22/2017 1:13 PM    [place]

## 2017-03-13 ENCOUNTER — Telehealth: Payer: Self-pay | Admitting: Cardiovascular Disease

## 2017-03-13 NOTE — Telephone Encounter (Signed)
03/11/17:  Total cholesterol 189, triglycerides 96, HDL 69, LDL 101

## 2017-03-17 NOTE — Telephone Encounter (Signed)
Left message on voicemail, ok per [facility]

## 2017-05-01 ENCOUNTER — Telehealth: Payer: Self-pay | Admitting: Cardiovascular Disease

## 2017-05-01 DIAGNOSIS — Z79899 Other long term (current) drug therapy: Secondary | ICD-10-CM

## 2017-05-01 DIAGNOSIS — E7800 Pure hypercholesterolemia, unspecified: Secondary | ICD-10-CM

## 2017-05-01 NOTE — Telephone Encounter (Signed)
Let's check fasting lipids and a CMP prior to the appointment

## 2017-05-01 NOTE — Telephone Encounter (Signed)
Returned the call to the patient. She stated that she had to cancel her appointment with Dr. Oval Linsey but has rescheduled for July 30th. She would like to know if she needs labs before that appointment and if she still needs to be on the pravastatin (according to the labs). Will route to the provider.

## 2017-05-01 NOTE — Telephone Encounter (Signed)
New message   Pt verbalized that she will be out of town and needs to cancel the appt   she declined the next available in July 30 because   Of the medication that she is on and she wants labs done  She wants the rn to call her back

## 2017-05-06 ENCOUNTER — Ambulatory Visit: Payer: Managed Care, Other (non HMO) | Admitting: Cardiovascular Disease

## 2017-05-07 NOTE — Telephone Encounter (Signed)
Spoke with patient, rescheduled ov secondary to company in town.  Will get labs few days prior to [facility]

## 2017-05-07 NOTE — Telephone Encounter (Signed)
Left message to call back  

## 2017-05-07 NOTE — Telephone Encounter (Signed)
F/u Message ° °Pt returning RN call. Please call back to discuss  °

## 2017-06-16 ENCOUNTER — Telehealth: Payer: Self-pay | Admitting: Cardiovascular Disease

## 2017-06-16 NOTE — Telephone Encounter (Signed)
New message        [facility] Pre-operative Risk Assessment    Request for surgical clearance:  1. What type of surgery is being performed? Close reduction of nasal fracture   2. When is this surgery scheduled? 7.24.2018   3. Are there any medications that need to be held prior to surgery and how long? Per MD recommendation   4. Name of physician performing surgery? Dr. Ernesto Rutherford   5. What is your office phone and fax number? [phone] /  fax [phone]   Theresa Holmes 06/16/2017, 10:12 AM  _________________________________________________________________   (provider comments below

## 2017-06-16 NOTE — Telephone Encounter (Signed)
Low risk for surgery.  OK to hold aspirin.

## 2017-06-16 NOTE — Telephone Encounter (Signed)
Routed to MD for review.

## 2017-06-17 NOTE — Telephone Encounter (Signed)
Sent to Dr Ernesto Rutherford via [facility]

## 2017-06-17 NOTE — Telephone Encounter (Signed)
Spoke with patient and she had surgery today

## 2017-06-23 ENCOUNTER — Ambulatory Visit: Payer: Managed Care, Other (non HMO) | Admitting: Cardiovascular Disease

## 2017-06-23 LAB — COMPREHENSIVE METABOLIC PANEL
A/G RATIO: 1.3 (ref 1.2–2.2)
ALBUMIN: 3.9 g/dL (ref 3.5–4.8)
ALT: 7 IU/L (ref 0–32)
AST: 12 IU/L (ref 0–40)
Alkaline Phosphatase: 100 IU/L (ref 39–117)
BILIRUBIN TOTAL: 1.1 mg/dL (ref 0.0–1.2)
BUN / CREAT RATIO: 39 — AB (ref 12–28)
BUN: 29 mg/dL — ABNORMAL HIGH (ref 8–27)
CHLORIDE: 100 mmol/L (ref 96–106)
CO2: 25 mmol/L (ref 20–29)
Calcium: 10.1 mg/dL (ref 8.7–10.3)
Creatinine, Ser: 0.75 mg/dL (ref 0.57–1.00)
GFR, EST AFRICAN AMERICAN: 90 mL/min/{1.73_m2} (ref >59)
GFR, EST NON AFRICAN AMERICAN: 78 mL/min/{1.73_m2} (ref >59)
GLOBULIN, TOTAL: 2.9 g/dL (ref 1.5–4.5)
Glucose: 96 mg/dL (ref 65–99)
POTASSIUM: 4.6 mmol/L (ref 3.5–5.2)
SODIUM: 142 mmol/L (ref 134–144)
TOTAL PROTEIN: 6.8 g/dL (ref 6.0–8.5)

## 2017-06-23 LAB — LIPID PANEL
CHOL/HDL RATIO: 2.9 ratio (ref 0.0–4.4)
CHOLESTEROL TOTAL: 188 mg/dL (ref 100–199)
HDL: 64 mg/dL (ref >39)
LDL Calculated: 99 mg/dL (ref 0–99)
TRIGLYCERIDES: 127 mg/dL (ref 0–149)
VLDL Cholesterol Cal: 25 mg/dL (ref 5–40)

## 2017-06-25 ENCOUNTER — Encounter: Payer: Self-pay | Admitting: Cardiovascular Disease

## 2017-06-25 ENCOUNTER — Ambulatory Visit (INDEPENDENT_AMBULATORY_CARE_PROVIDER_SITE_OTHER): Payer: 59 | Admitting: Cardiovascular Disease

## 2017-06-25 VITALS — BP 104/76 | HR 85 | Ht 62.0 in | Wt 162.0 lb

## 2017-06-25 DIAGNOSIS — E7800 Pure hypercholesterolemia, unspecified: Secondary | ICD-10-CM | POA: Diagnosis not present

## 2017-06-25 NOTE — Patient Instructions (Signed)
Medication Instructions:  .Your physician recommends that you continue on your current medications as directed. Please refer to the Current Medication list given to you today.  Labwork: none  Testing/Procedures: none  Follow-Up: As needed   

## 2017-06-25 NOTE — Progress Notes (Signed)
Cardiology Office Note   Date:  06/25/2017   ID:  Theresa Holmes, DOB 1941/11/23, MRN [id number]  PCP:  Reynold Bowen, MD  Cardiologist:   Skeet Latch, MD   Chief Complaint  Patient presents with  . Follow-up      History of Present Illness: Theresa Holmes is a 76 y.o. female with hyperlipidemia who presents for follow up.  She was initialy seen 09/2016 for chest pain that was atypical.  She was referred for exercise Myoview that revealed LVEF 61% and was negative for ischemia.  At that appointment her ASCVD 10 year risk was 12% but she was not interested in trying a statin.  Since that time she has started a high protein diet and lost several pounds.  The program recommends that you not exercise in this phase.  She had her lipids re-checked and the numbers were more elevated.  At her last appointment she started pravastatin.  Since then her lipids were re-checked and her LDL was 99.  She has been feeling well and denies any myalgias. She did suffer a fall while walking at [place]. There was an area with a ravine and she tripped and fell, breaking her nose. She required surgery last week. She is recovering well but is not fully back into her exercise routine. She continues to walk and has been chasing after her grandchildren without any chest pain or shortness of breath. She also denies lower extremity edema, orthopnea, or PND.  She looks forward to getting back on her exercise bike soon.     Past Medical History:  Diagnosis Date  . Arthritis   . Atypical chest pain 10/22/2016  . Chronic back pain   . Exogenous obesity   . GERD (gastroesophageal reflux disease)   . H/O hiatal hernia   . Hypercholesterolemia   . Hyperlipidemia   . Hypertension   . Hypothyroidism    HX PARTIAL THYROIDECTOMY FOR  NODULE- BENIGN  . Renal stones   . Shingles 11/2012  . Spinal stenosis   . Thyroid mass    right, benign  . Varicose vein     Past Surgical History:  Procedure Laterality Date    . ABDOMINAL HYSTERECTOMY  1985  . BREAST BIOPSY  9/05   stereotactic  . CARPAL TUNNEL RELEASE  12/15   right wrist  . CHOLECYSTECTOMY    . COMBINED AUGMENTATION MAMMAPLASTY AND ABDOMINOPLASTY  1977  . EYE SURGERY  12/05   retinal tear-left  . [place]  2002  . HERNIA REPAIR  2004  . HIATAL HERNIA REPAIR N/A 01/21/2014   Procedure: LAPAROSCOPIC REPAIR RECURRENT  HIATAL HERNIA REDO NISSEN, upper endoscopy;  Surgeon: Pedro Earls, MD;  Location: [facility];  Service: General;  Laterality: N/A;  . KIDNEY STONE SURGERY  2002  . NISSEN FUNDOPLICATION    . ROTATOR CUFF REPAIR  2001  . ROTATOR CUFF REPAIR  1/12    left  . ROTATOR CUFF REPAIR  5/13    open  . SPINAL FUSION  2005   L4  & L5  . THYROIDECTOMY    . TONSILLECTOMY AND ADENOIDECTOMY     as a child  . UPPER ENDOSCOPY W/ SCLEROTHERAPY  2000  . VARICOSE VEIN SURGERY  1977     Current Outpatient Prescriptions  Medication Sig Dispense Refill  . aspirin EC 81 MG tablet Take 81 mg by mouth daily.    Marland Kitchen b complex vitamins tablet Take 1 tablet by mouth daily.    Marland Kitchen  BIOTIN PO Take 1 tablet by mouth daily.    . calcium & magnesium carbonates (MYLANTA) 272-536 MG tablet Take 1 tablet by mouth daily.    . Cholecalciferol (VITAMIN D) 2000 units CAPS Take 1 capsule by mouth daily.    . Coenzyme Q10 (COQ10 PO) Take 300 mg by mouth daily.    Marland Kitchen levothyroxine (SYNTHROID, LEVOTHROID) 137 MCG tablet Take 137 mcg by mouth daily before breakfast.    . LUTEIN PO Take 1 tablet by mouth daily.    . montelukast (SINGULAIR) 10 MG tablet Take 10 mg by mouth at bedtime.    . Potassium 75 MG TABS Take 1 tablet by mouth daily.    . pravastatin (PRAVACHOL) 10 MG tablet Take 1 tablet (10 mg total) by mouth daily. 90 tablet 1  . Probiotic Product (PROBIOTIC-10 PO) Take 1 tablet by mouth daily.    Marland Kitchen VITAMIN A PO Take 4,000 mcg by mouth daily.    . Zinc 30 MG TABS Take 1 tablet by mouth daily.     No current facility-administered medications for  this visit.     Allergies:   Patient has no known allergies.    Social History:  The patient  reports that she has never smoked. She has never used smokeless tobacco. She reports that she drinks about 0.6 oz of alcohol per week . She reports that she does not use drugs.   Family History:  The patient's family history includes Breast cancer (age of onset: 67) in her maternal grandmother; Heart failure in her father; Kidney failure in her father; Pulmonary embolism in her mother.    ROS:  Please see the history of present illness.   Otherwise, review of systems are positive for none.   All other systems are reviewed and negative.    PHYSICAL EXAM: VS:  BP 104/76   Pulse 85   Ht 5\' 2"  (1.575 m)   Wt 73.5 kg (162 lb)   LMP 11/26/1983   BMI 29.63 kg/m  , BMI Body mass index is 29.63 kg/m. GENERAL:  Well appearing HEENT:  Pupils equal round and reactive, fundi not visualized, oral mucosa unremarkable NECK:  No jugular venous distention, waveform within normal limits, carotid upstroke brisk and symmetric, no bruits, no thyromegaly LYMPHATICS:  No cervical adenopathy LUNGS:  Clear to auscultation bilaterally HEART:  RRR.  PMI not displaced or sustained,S1 and S2 within normal limits, no S3, no S4, no clicks, no rubs, no murmurs ABD:  Flat, positive bowel sounds normal in frequency in pitch, no bruits, no rebound, no guarding, no midline pulsatile mass, no hepatomegaly, no splenomegaly EXT:  2 plus pulses throughout, no edema, no cyanosis no clubbing SKIN:  No rashes no nodules NEURO:  Cranial nerves II through XII grossly intact, motor grossly intact throughout PSYCH:  Cognitively intact, oriented to person place and time   EKG:  EKG is ordered today. 10/22/16 demonstrates sinus rhythm.  Rate 80 bpm.  06/25/17: Sinus rhythm. Rate 85 bpm. Left axis deviation. Poor R-wave progression. Cannot rule out prior inferior MI.  05/20/12: Nuclear stress test- LVEF 75%.  No perfusion  defects.  Exercise Myoview 10/30/16:  The left ventricular ejection fraction is normal (55-65%).  Nuclear stress EF: 61%.  There was no ST segment deviation noted during stress.  The study is normal.  This is a low risk study.  Recent Labs: 06/23/2017: ALT 7; BUN 29; Creatinine, Ser 0.75; Potassium 4.6; Sodium 142    Lipid Panel  Component Value Date/Time   CHOL 188 06/23/2017 0837   TRIG 127 06/23/2017 0837   HDL 64 06/23/2017 0837   CHOLHDL 2.9 06/23/2017 0837   CHOLHDL 3 02/04/2012 0932   VLDL 23.2 02/04/2012 0932   LDLCALC 99 06/23/2017 0837   09/30/16: Sodium 142, potassium 4.5, BUN 30, creatinine 0.8 AST 14, ALT 12 WBC 5.7, hemoglobin 13.7, hematocrit 42.0, platelets 258 Total cholesterol 213, triglycerides 111, HDL 54, LDL 137 TSH 0.1, free T4 1 0.3  01/20/17: Total cholesterol 245, triglycerides 107, HDL 66, LDL 158    Wt Readings from Last 3 Encounters:  06/25/17 73.5 kg (162 lb)  01/22/17 71.8 kg (158 lb 3.2 oz)  10/30/16 73 kg (161 lb)      ASSESSMENT AND PLAN:  # Hyperlipidemia: Cholesterol has improved significantly. Her LDL is now 99. Her goal is less than 130.  LFTs are stable. Continue pravastatin. No changes.  # Elevated BP:  Resolved.    Current medicines are reviewed at length with the patient today.  The patient does not have concerns regarding medicines.  The following changes have been made: none  Labs/ tests ordered today include:   No orders of the defined types were placed in this encounter.    Disposition:   FU with Arick Mareno C. Oval Linsey, MD, [facility] as needed.    This note was written with the assistance of speech recognition software.  Please excuse any transcriptional errors.  Signed, Estera Ozier C. Oval Linsey, MD, [facility]  06/25/2017 8:24 AM    [facility]

## 2017-07-11 ENCOUNTER — Encounter: Payer: Self-pay | Admitting: Obstetrics & Gynecology

## 2017-07-22 ENCOUNTER — Other Ambulatory Visit: Payer: Self-pay | Admitting: Cardiovascular Disease

## 2017-07-22 NOTE — Telephone Encounter (Signed)
Refill Request.  

## 2017-08-22 ENCOUNTER — Ambulatory Visit: Payer: Managed Care, Other (non HMO) | Admitting: Obstetrics & Gynecology

## 2017-09-16 ENCOUNTER — Encounter: Payer: Self-pay | Admitting: Obstetrics & Gynecology

## 2017-09-16 ENCOUNTER — Ambulatory Visit: Payer: 59 | Admitting: Obstetrics & Gynecology

## 2017-09-16 VITALS — BP 122/84 | HR 54 | Resp 12 | Ht 61.5 in | Wt 163.0 lb

## 2017-09-16 DIAGNOSIS — Z01419 Encounter for gynecological examination (general) (routine) without abnormal findings: Secondary | ICD-10-CM

## 2017-09-16 NOTE — Progress Notes (Signed)
76 y.o. G3P3 Married Caucasian F here for annual exam.  Doing well.  Had some jaw pain last winter due to a root canal.  Was eating dinner with Dr. Mare Ferrari and mentioned this to him.  He suggested she see her cardiologist.  Had perfusion study.  She is now taking a statin three days a week.  Reports she broke her nose with a fall when she was in [place].  She just tripped.  Had it set and it has healed well.    PCP:  Dr. Forde Dandy.  Has appt next month.    Patient's last menstrual period was 11/26/1983.          Sexually active: Yes.    The current method of family planning is status post hysterectomy.  Exercising: Yes.    recumbant bike Smoker:  no  Health Maintenance: Pap:  2001 negative  History of abnormal Pap:  no MMG:  06/26/17 BIRADS 1 negative  Colonoscopy:  07/24/12, Dr. Cristina Gong. Follow up not recommended. BMD:   04/04/16 normal, repeat 4-5 years  TDaP:  2012  Pneumonia vaccine(s):  ~6 years ago Zostavax:   ~8 years ago.  Is consde Hep C testing: not indicated  Screening Labs: PCP, Hb today: PCP   reports that she has never smoked. She has never used smokeless tobacco. She reports that she drinks about 0.6 oz of alcohol per week . She reports that she does not use drugs.  Past Medical History:  Diagnosis Date  . Arthritis   . Atypical chest pain 10/22/2016  . Chronic back pain   . Exogenous obesity   . GERD (gastroesophageal reflux disease)   . H/O hiatal hernia   . Hypercholesterolemia   . Hyperlipidemia   . Hypertension   . Hypothyroidism    HX PARTIAL THYROIDECTOMY FOR  NODULE- BENIGN  . Renal stones   . Shingles 11/2012  . Spinal stenosis   . Thyroid mass    right, benign  . Varicose vein     Past Surgical History:  Procedure Laterality Date  . ABDOMINAL HYSTERECTOMY  1985  . BREAST BIOPSY  9/05   stereotactic  . CARPAL TUNNEL RELEASE  12/15   right wrist  . CHOLECYSTECTOMY    . COMBINED AUGMENTATION MAMMAPLASTY AND ABDOMINOPLASTY  1977  . EYE SURGERY   12/05   retinal tear-left  . [place]  2002  . HERNIA REPAIR  2004  . HIATAL HERNIA REPAIR N/A 01/21/2014   Procedure: LAPAROSCOPIC REPAIR RECURRENT  HIATAL HERNIA REDO NISSEN, upper endoscopy;  Surgeon: Pedro Earls, MD;  Location: [facility];  Service: General;  Laterality: N/A;  . KIDNEY STONE SURGERY  2002  . NISSEN FUNDOPLICATION    . ROTATOR CUFF REPAIR  2001  . ROTATOR CUFF REPAIR  1/12    left  . ROTATOR CUFF REPAIR  5/13    open  . SPINAL FUSION  2005   L4  & L5  . THYROIDECTOMY    . TONSILLECTOMY AND ADENOIDECTOMY     as a child  . UPPER ENDOSCOPY W/ SCLEROTHERAPY  2000  . VARICOSE VEIN SURGERY  1977    Current Outpatient Prescriptions  Medication Sig Dispense Refill  . aspirin EC 81 MG tablet Take 81 mg by mouth daily.    Marland Kitchen b complex vitamins tablet Take 1 tablet by mouth daily.    Marland Kitchen BIOTIN PO Take 1 tablet by mouth daily.    . calcium & magnesium carbonates (MYLANTA) 016-010 MG tablet  Take 1 tablet by mouth daily.    . Cholecalciferol (VITAMIN D) 2000 units CAPS Take 1 capsule by mouth daily.    . Coenzyme Q10 (COQ10 PO) Take 300 mg by mouth daily.    Marland Kitchen levothyroxine (SYNTHROID, LEVOTHROID) 137 MCG tablet Take 137 mcg by mouth daily before breakfast.    . LUTEIN PO Take 1 tablet by mouth daily.    . Potassium 75 MG TABS Take 1 tablet by mouth daily.    . pravastatin (PRAVACHOL) 10 MG tablet Take 1 tablet (10 mg total) by mouth daily. (Patient taking differently: Take 10 mg by mouth 3 (three) times a week. ) 90 tablet 3  . predniSONE (DELTASONE) 10 MG tablet Take 10 mg by mouth See admin instructions.  1  . Probiotic Product (PROBIOTIC-10 PO) Take 1 tablet by mouth daily.    Marland Kitchen VITAMIN A PO Take 4,000 mcg by mouth daily.    . Zinc 30 MG TABS Take 1 tablet by mouth daily.     No current facility-administered medications for this visit.     Family History  Problem Relation Age of Onset  . Kidney failure Father        complete shut down  . Heart failure  Father   . Pulmonary embolism Mother        amniotic fluid embolism  . Breast cancer Maternal Grandmother 80    ROS:  Pertinent items are noted in HPI.  Otherwise, a comprehensive ROS was negative.  Exam:   BP 122/84 (BP Location: Right Arm, Patient Position: Sitting, Cuff Size: Normal)   Pulse (!) 54   Resp 12   Ht 5' 1.5" (1.562 m)   Wt 163 lb (73.9 kg)   LMP 11/26/1983   BMI 30.30 kg/m  Height: 5' 1.5" (156.2 cm)  Ht Readings from Last 3 Encounters:  09/16/17 5' 1.5" (1.562 m)  06/25/17 5\' 2"  (1.575 m)  01/22/17 5\' 1"  (1.549 m)    General appearance: alert, cooperative and appears stated age Head: Normocephalic, without obvious abnormality, atraumatic Neck: no adenopathy, supple, symmetrical, trachea midline and thyroid normal to inspection and palpation Lungs: clear to auscultation bilaterally Breasts: normal appearance, no masses or tenderness, bilateral implants, encapsulated implants present bilaterally, no change Heart: regular rate and rhythm Abdomen: soft, non-tender; bowel sounds normal; no masses,  no organomegaly Extremities: extremities normal, atraumatic, no cyanosis or edema Skin: Skin color, texture, turgor normal. No rashes or lesions Lymph nodes: Cervical, supraclavicular, and axillary nodes normal. No abnormal inguinal nodes palpated Neurologic: Grossly normal   Pelvic: External genitalia:  no lesions              Urethra:  normal appearing urethra with no masses, tenderness or lesions              Bartholins and Skenes: normal                 Vagina: normal appearing vagina with normal color and discharge, no lesions              Cervix: absent              Pap taken: No. Bimanual Exam:  Uterus:  uterus absent              Adnexa: no mass, fullness, tenderness               Rectovaginal: Confirms               Anus:  normal sphincter tone, no lesions  Chaperone was present for exam.  A:  Well Woman with normal exam PMP, no HRT H/o elevated  lipids now on low dosed statin Hypothyroidism with h/o partial thyroidectomy H/o SUI and OAB, stable H/O small ovarian cyst noted on PUS 9/14.  Negative CT 5/17  P:   Mammogram guidelines reviewed.  Pt still desires to do this yearly pap smear not indicated Lab work scheduled for next week at [facility]. Will discuss Shingrix vaccination with Dr. Forde Dandy at visit in a couple of weeks. return annually or prn

## 2017-09-24 IMAGING — DX DG CHEST 2V
2 series · 2 of 2 positions shown · non-contrast
Comparison: CT of the chest 04/25/2016

CLINICAL DATA: Follow-up of left lung infiltrate.

EXAM:
CHEST  2 VIEW

[chest pa]
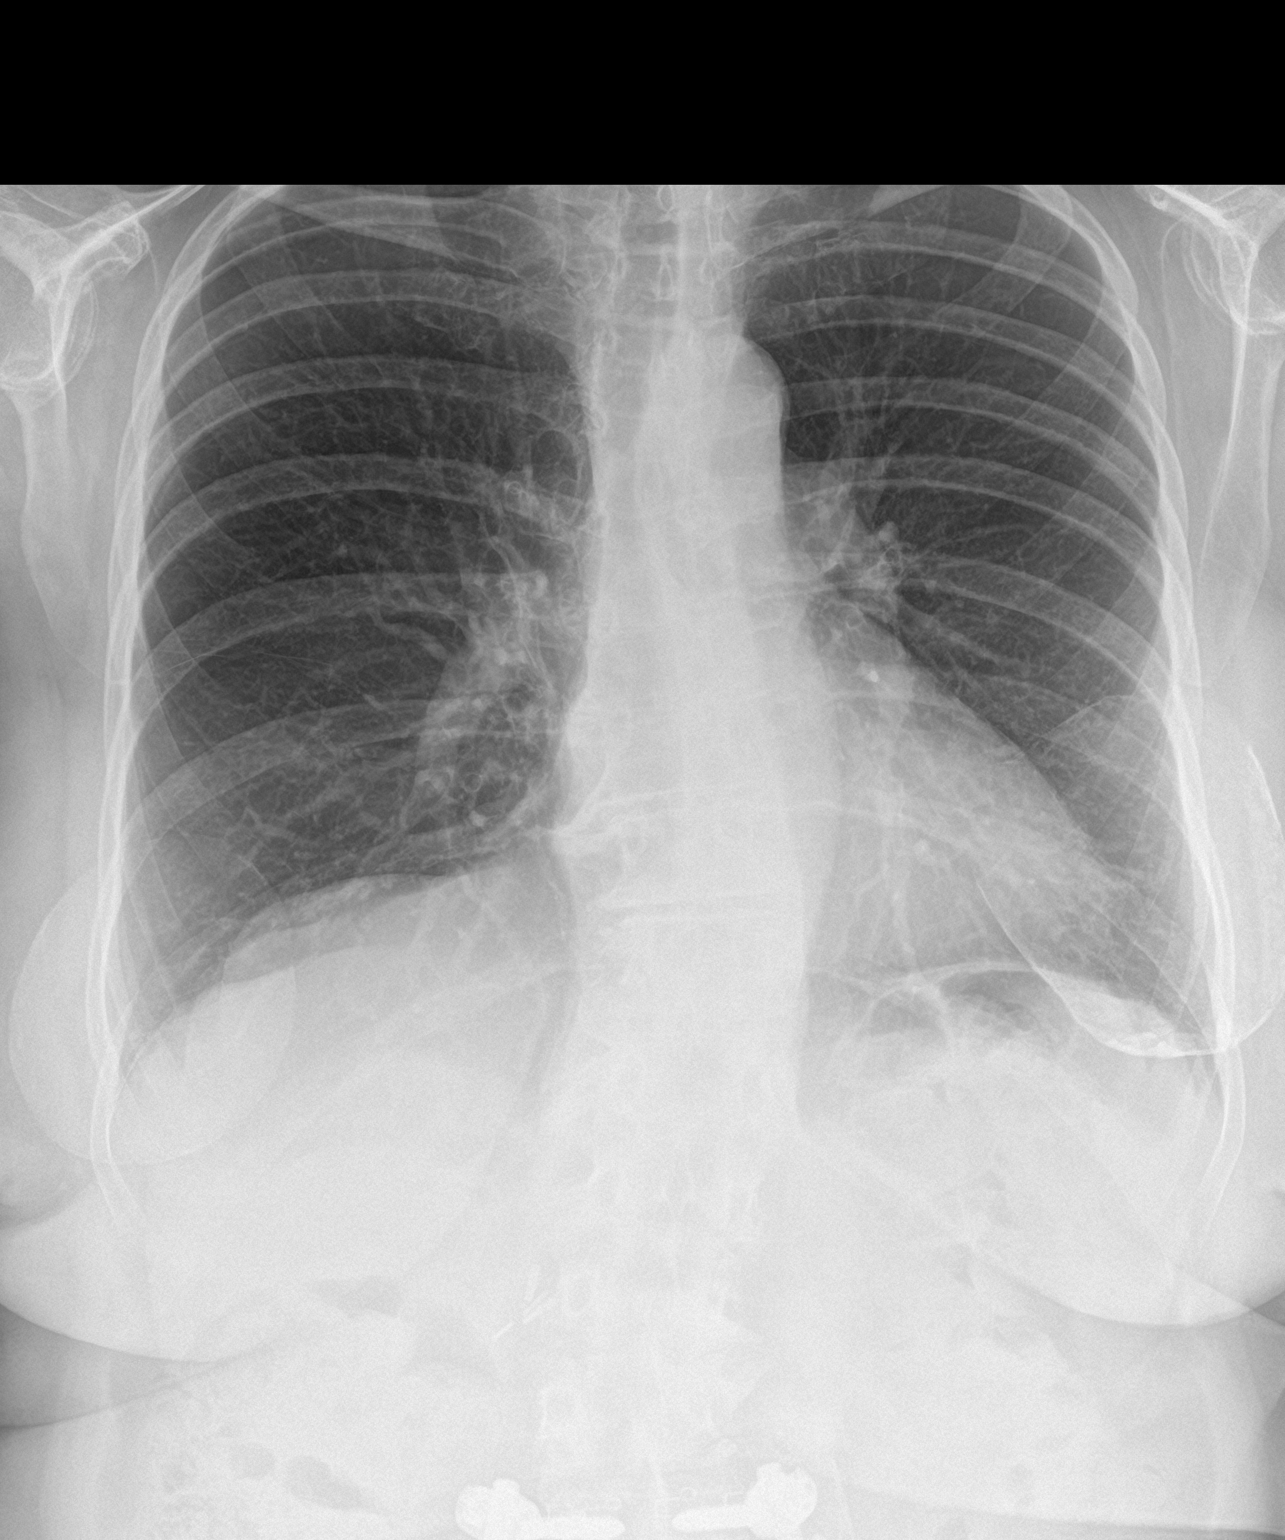

[chest lat]
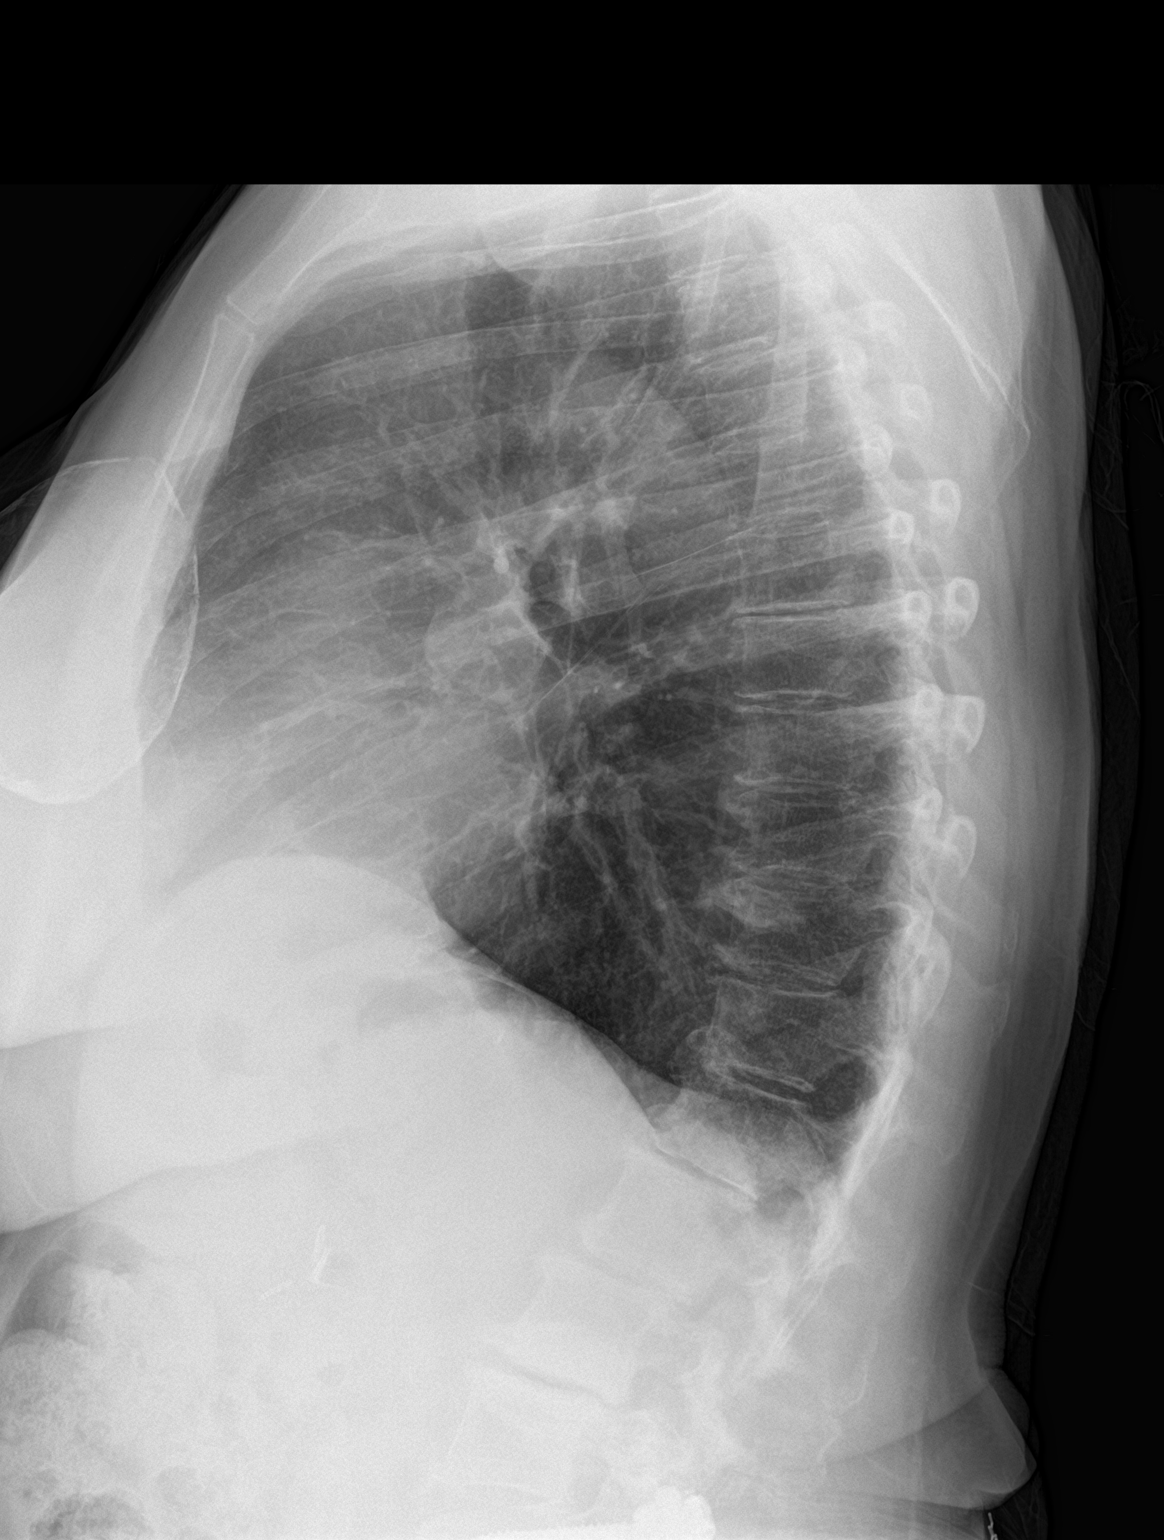

[2 of 2 positions shown; findings below may reference images not displayed]

FINDINGS: Calcified breast implants are seen.

Cardiomediastinal silhouette is normal. Mediastinal contours appear
intact.

There is no evidence of pneumothorax. There are persistent streaky
airspace opacities in the left lower lobe with small left pleural
effusion.

Osseous structures are without acute abnormality. Soft tissues are
grossly normal.
IMPRESSION: Persistent streaky airspace opacities in the left lower lobe with
small left pleural effusion or pleural thickening.

## 2017-11-12 ENCOUNTER — Ambulatory Visit (INDEPENDENT_AMBULATORY_CARE_PROVIDER_SITE_OTHER): Payer: 59 | Admitting: Ophthalmology

## 2017-11-12 DIAGNOSIS — H35033 Hypertensive retinopathy, bilateral: Secondary | ICD-10-CM

## 2017-11-12 DIAGNOSIS — H43813 Vitreous degeneration, bilateral: Secondary | ICD-10-CM

## 2017-11-12 DIAGNOSIS — I10 Essential (primary) hypertension: Secondary | ICD-10-CM | POA: Diagnosis not present

## 2017-11-12 DIAGNOSIS — H26491 Other secondary cataract, right eye: Secondary | ICD-10-CM | POA: Diagnosis not present

## 2017-11-12 DIAGNOSIS — H33303 Unspecified retinal break, bilateral: Secondary | ICD-10-CM | POA: Diagnosis not present

## 2017-12-03 DIAGNOSIS — Z85828 Personal history of other malignant neoplasm of skin: Secondary | ICD-10-CM | POA: Diagnosis not present

## 2017-12-03 DIAGNOSIS — D225 Melanocytic nevi of trunk: Secondary | ICD-10-CM | POA: Diagnosis not present

## 2017-12-03 DIAGNOSIS — L918 Other hypertrophic disorders of the skin: Secondary | ICD-10-CM | POA: Diagnosis not present

## 2017-12-03 DIAGNOSIS — L570 Actinic keratosis: Secondary | ICD-10-CM | POA: Diagnosis not present

## 2017-12-03 DIAGNOSIS — L821 Other seborrheic keratosis: Secondary | ICD-10-CM | POA: Diagnosis not present

## 2017-12-10 ENCOUNTER — Encounter (INDEPENDENT_AMBULATORY_CARE_PROVIDER_SITE_OTHER): Payer: BLUE CROSS/BLUE SHIELD | Admitting: Ophthalmology

## 2017-12-10 DIAGNOSIS — H2701 Aphakia, right eye: Secondary | ICD-10-CM

## 2018-03-09 ENCOUNTER — Other Ambulatory Visit: Payer: Self-pay

## 2018-03-09 DIAGNOSIS — I83893 Varicose veins of bilateral lower extremities with other complications: Secondary | ICD-10-CM

## 2018-03-11 ENCOUNTER — Other Ambulatory Visit: Payer: Self-pay

## 2018-03-20 DIAGNOSIS — M25561 Pain in right knee: Secondary | ICD-10-CM | POA: Diagnosis not present

## 2018-03-23 IMAGING — NM NM MISC PROCEDURE
6 series · 36 of 36 positions shown · non-contrast
Comparison: none

[Series 1: wbr_r-proj_st wbr rest · 6.40mm/px · 6 of 64 frames shown]
[frame 6/64]
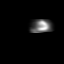
[frame 16/64]
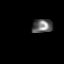
[frame 27/64]
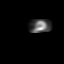
[frame 38/64]
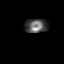
[frame 48/64]
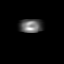
[frame 59/64]
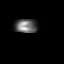

[Series 1: wbr rest · 6.40mm/px · 6 of 64 frames shown]
[frame 6/64]
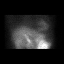
[frame 16/64]
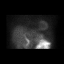
[frame 27/64]
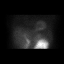
[frame 38/64]
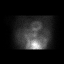
[frame 48/64]
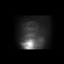
[frame 59/64]
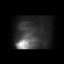

[Series 2: wbr_s-proj_st wbr stress-gsp · 6.40mm/px · 6 of 512 frames shown]
[frame 43/512]
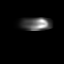
[frame 128/512]
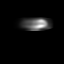
[frame 214/512]
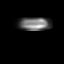
[frame 299/512]
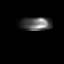
[frame 384/512]
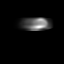
[frame 470/512]
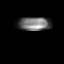

[Series 2: wbr stress-gsp · 6.40mm/px · 6 of 511 frames shown]
[frame 43/511]
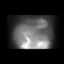
[frame 128/511]
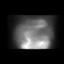
[frame 213/511]
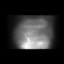
[frame 298/511]
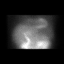
[frame 383/511]
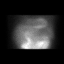
[frame 469/511]
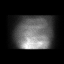

[Series 3: wbr stress-sum-em · 6.40mm/px · 6 of 64 frames shown]
[frame 6/64]
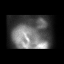
[frame 16/64]
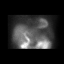
[frame 27/64]
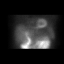
[frame 38/64]
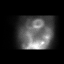
[frame 48/64]
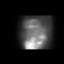
[frame 59/64]
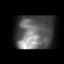

[Series 3: wbr_s-proj_st wbr stress-sum-em · 6.40mm/px · 6 of 64 frames shown]
[frame 6/64]
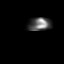
[frame 16/64]
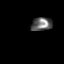
[frame 27/64]
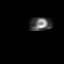
[frame 38/64]
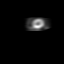
[frame 48/64]
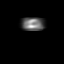
[frame 59/64]
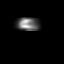

[36 of 36 positions shown; findings below may reference images not displayed]

Canned report from images found in remote index.

Refer to host system for actual result text.

## 2018-03-30 DIAGNOSIS — S76311A Strain of muscle, fascia and tendon of the posterior muscle group at thigh level, right thigh, initial encounter: Secondary | ICD-10-CM | POA: Diagnosis not present

## 2018-04-07 DIAGNOSIS — M5416 Radiculopathy, lumbar region: Secondary | ICD-10-CM | POA: Diagnosis not present

## 2018-04-14 ENCOUNTER — Encounter: Payer: Self-pay | Admitting: Vascular Surgery

## 2018-04-14 ENCOUNTER — Ambulatory Visit (HOSPITAL_COMMUNITY)
Admission: RE | Admit: 2018-04-14 | Discharge: 2018-04-14 | Disposition: A | Discharge status: Home / Self Care | Payer: BLUE CROSS/BLUE SHIELD | Source: Ambulatory Visit | Attending: Vascular Surgery | Admitting: Vascular Surgery

## 2018-04-14 ENCOUNTER — Ambulatory Visit: Payer: BLUE CROSS/BLUE SHIELD | Admitting: Vascular Surgery

## 2018-04-14 VITALS — BP 169/84 | HR 65 | Temp 98.2°F | Resp 16 | Ht 62.0 in | Wt 172.0 lb

## 2018-04-14 DIAGNOSIS — I83893 Varicose veins of bilateral lower extremities with other complications: Secondary | ICD-10-CM

## 2018-04-14 DIAGNOSIS — R936 Abnormal findings on diagnostic imaging of limbs: Secondary | ICD-10-CM | POA: Insufficient documentation

## 2018-04-14 NOTE — Progress Notes (Signed)
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 female, who is here today for evaluation.  This is Dr. Chriss Czar Giofree's wife.  The history of bilateral great saphenous vein stripping 40to 50 years ago.  She has had a very durable result.  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  Also large raised telangiectasia.  She does have some swelling and achy sensation and does have pain specifically over the varicosities.  Is quite active.  She has tried compression garments in the past but none recently.  PT  Past Medical History:  Diagnosis Date  . Arthritis   . Atypical chest pain 10/22/2016  . Chronic back pain   . Exogenous obesity   . GERD (gastroesophageal reflux disease)   . H/O hiatal hernia   . Hypercholesterolemia   . Hyperlipidemia   . Hypertension   . Hypothyroidism    HX PARTIAL THYROIDECTOMY FOR  NODULE- BENIGN  . Renal stones   . Shingles 11/2012  . Spinal stenosis   . Thyroid mass    right, benign  . Varicose vein     Family History  Problem Relation Age of Onset  . Kidney failure Father        complete shut down  . Heart failure Father   . Pulmonary embolism Mother        amniotic fluid embolism  . Breast cancer Maternal Grandmother 80    SOCIAL HISTORY: Social History   Socioeconomic History  . Marital status: Married    Spouse name: Not on file  . Number of children: Not on file  . Years of education: Not on file  . Highest education level: Not on file  Occupational History  . Not on file  Social Needs  . Financial resource strain: Not on file  . Food insecurity:    Worry: Not on file    Inability: Not on file  . Transportation needs:    Medical: Not on file    Non-medical: Not on file    Tobacco Use  . Smoking status: Never Smoker  . Smokeless tobacco: Never Used  Substance and Sexual Activity  . Alcohol use: Yes    Alcohol/week: 0.6 oz    Types: 1 Standard drinks or equivalent per week    Comment: glass of wine  . Drug use: No  . Sexual activity: Yes    Partners: Male    Birth control/protection: Surgical    Comment: TVH  Lifestyle  . Physical activity:    Days per week: Not on file    Minutes per session: Not on file  . Stress: Not on file  Relationships  . Social connections:    Talks on phone: Not on file    Gets together: Not on file    Attends religious service: Not on file    Active member of club or organization: Not on file    Attends meetings of clubs or organizations: Not on file    Relationship status: Not on file  . Intimate partner violence:    Fear of current or ex partner: Not on file    Emotionally abused: Not on file    Physically abused: Not on file    Forced sexual activity: Not on file  Other Topics Concern  . Not on file  Social History Narrative  . Not on file    No Known Allergies  Current Outpatient Medications  Medication Sig Dispense Refill  . aspirin EC 81 MG tablet Take 81 mg by mouth daily.    Marland Kitchen b complex vitamins tablet Take 1 tablet by mouth daily.    Marland Kitchen BIOTIN PO Take 1 tablet by mouth daily.    . calcium & magnesium carbonates (MYLANTA) 169-678 MG tablet Take 1 tablet by mouth daily.    . Cholecalciferol (VITAMIN D) 2000 units CAPS Take 1 capsule by mouth daily.    . Coenzyme Q10 (COQ10 PO) Take 300 mg by mouth daily.    Marland Kitchen levothyroxine (SYNTHROID, LEVOTHROID) 137 MCG tablet Take 137 mcg by mouth daily before breakfast.    . LUTEIN PO Take 1 tablet by mouth daily.    . Potassium 75 MG TABS Take 1 tablet by mouth daily.    . pravastatin (PRAVACHOL) 10 MG tablet Take 1 tablet (10 mg total) by mouth daily. (Patient taking differently: Take 10 mg by mouth 3 (three) times a week. ) 90 tablet 3  . predniSONE (DELTASONE)  10 MG tablet Take 10 mg by mouth See admin instructions.  1  . Probiotic Product (PROBIOTIC-10 PO) Take 1 tablet by mouth daily.    Marland Kitchen VITAMIN A PO Take 4,000 mcg by mouth daily.    . Zinc 30 MG TABS Take 1 tablet by mouth daily.     No current facility-administered medications for this visit.     REVIEW OF SYSTEMS:  [X]  denotes positive finding, [ ]  denotes negative finding Cardiac  Comments:  Chest pain or chest pressure:    Shortness of breath upon exertion:    Short of breath when lying flat:    Irregular heart rhythm:        Vascular    Pain in calf, thigh, or hip brought on by ambulation:    Pain in feet at night that wakes you up from your sleep:     Blood clot in your veins:    Leg swelling:  x       Pulmonary    Oxygen at home:    Productive cough:     Wheezing:         Neurologic    Sudden weakness in arms or legs:     Sudden numbness in arms or legs:     Sudden onset of difficulty speaking or slurred speech:    Temporary loss of vision in one eye:     Problems with dizziness:         Gastrointestinal    Blood in stool:     Vomited blood:         Genitourinary    Burning when urinating:     Blood in urine:        Psychiatric    Major depression:         Hematologic    Bleeding problems:    Problems with blood clotting too easily:        Skin    Rashes or ulcers:        Constitutional    Fever or chills:      PHYSICAL EXAM: Vitals:   04/14/18 1101  BP: (!) 169/84  Pulse: 65  Resp: 16  Temp: 98.2 F (36.8 C)  SpO2: 95%  Weight: 172 lb (78 kg)  Height: 5\' 2"  (1.575 m)    GENERAL: The patient is a well-nourished female, in no  acute distress. The vital signs are documented above. CARDIOVASCULAR: We will dorsalis pedis pulses.  Extensive telangiectasia on both lower extremities.  Does have varicosities mainly in her calves bilaterally PULMONARY: There is good air exchange  ABDOMEN: Soft and non-tender  MUSCULOSKELETAL: There are no major  deformities or cyanosis. NEUROLOGIC: No focal weakness or paresthesias are detected. SKIN: There are no ulcers or rashes noted. PSYCHIATRIC: The patient has a normal affect.  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  We have fitted her with thigh-high graduated compression garments today and is structurally use of these.  We will see her in 3 months to determine if elevation and compression alone are satisfactory.  She does have pain specifically over these large varicosities and suspect that she would have symptom relief with removal of these.  We will make further recommendation pending her conservative treatment trial.  She will see Dr. Anice Paganini in 3 months   Rosetta Posner, MD [facility] Tel [phone] Pager [phone]

## 2018-04-15 ENCOUNTER — Encounter: Payer: Self-pay | Admitting: Orthopedic Surgery

## 2018-04-29 ENCOUNTER — Other Ambulatory Visit: Payer: BLUE CROSS/BLUE SHIELD

## 2018-04-29 ENCOUNTER — Other Ambulatory Visit: Payer: Self-pay | Admitting: Orthopedic Surgery

## 2018-04-29 ENCOUNTER — Ambulatory Visit
Admission: RE | Admit: 2018-04-29 | Discharge: 2018-04-29 | Disposition: A | Discharge status: Home / Self Care | Payer: BLUE CROSS/BLUE SHIELD | Source: Ambulatory Visit | Attending: Orthopedic Surgery | Admitting: Orthopedic Surgery

## 2018-04-29 DIAGNOSIS — R05 Cough: Secondary | ICD-10-CM

## 2018-04-29 DIAGNOSIS — J209 Acute bronchitis, unspecified: Secondary | ICD-10-CM

## 2018-04-29 DIAGNOSIS — R059 Cough, unspecified: Secondary | ICD-10-CM

## 2018-07-03 DIAGNOSIS — Z1231 Encounter for screening mammogram for malignant neoplasm of breast: Secondary | ICD-10-CM | POA: Diagnosis not present

## 2018-07-06 ENCOUNTER — Other Ambulatory Visit: Payer: Self-pay | Admitting: Specialist

## 2018-07-06 DIAGNOSIS — M5416 Radiculopathy, lumbar region: Secondary | ICD-10-CM

## 2018-07-07 ENCOUNTER — Other Ambulatory Visit: Payer: BLUE CROSS/BLUE SHIELD

## 2018-07-07 ENCOUNTER — Other Ambulatory Visit: Payer: Self-pay | Admitting: Specialist

## 2018-07-07 ENCOUNTER — Ambulatory Visit
Admission: RE | Admit: 2018-07-07 | Discharge: 2018-07-07 | Disposition: A | Discharge status: Home / Self Care | Payer: BLUE CROSS/BLUE SHIELD | Source: Ambulatory Visit | Attending: Specialist | Admitting: Specialist

## 2018-07-07 DIAGNOSIS — M5416 Radiculopathy, lumbar region: Secondary | ICD-10-CM

## 2018-07-07 MED ORDER — METHYLPREDNISOLONE ACETATE 40 MG/ML INJ SUSP (RADIOLOG
120.0000 mg | Freq: Once | INTRAMUSCULAR | Status: AC
  Administered 2018-07-07: 120 mg via EPIDURAL

## 2018-07-07 MED ORDER — IOPAMIDOL (ISOVUE-M 200) INJECTION 41%
1.0000 mL | Freq: Once | INTRAMUSCULAR | Status: AC
  Administered 2018-07-07: 1 mL via EPIDURAL

## 2018-07-07 NOTE — Discharge Instructions (Signed)

## 2018-07-09 ENCOUNTER — Encounter: Payer: Self-pay | Admitting: Obstetrics & Gynecology

## 2018-07-22 ENCOUNTER — Ambulatory Visit: Payer: BLUE CROSS/BLUE SHIELD | Admitting: Vascular Surgery

## 2018-07-23 ENCOUNTER — Ambulatory Visit: Payer: BLUE CROSS/BLUE SHIELD | Admitting: Vascular Surgery

## 2018-07-23 ENCOUNTER — Encounter: Payer: Self-pay | Admitting: Vascular Surgery

## 2018-07-23 ENCOUNTER — Other Ambulatory Visit: Payer: Self-pay

## 2018-07-23 VITALS — BP 135/85 | HR 76 | Temp 97.7°F | Resp 16 | Ht 62.0 in | Wt 180.0 lb

## 2018-07-23 DIAGNOSIS — I83893 Varicose veins of bilateral lower extremities with other complications: Secondary | ICD-10-CM | POA: Diagnosis not present

## 2018-07-23 NOTE — Progress Notes (Signed)
Patient name: Theresa Holmes MRN: [id number] DOB: 04/02/1941 Sex: female  REASON FOR VISIT:   Painful varicose veins bilaterally  HPI:   Theresa Holmes is a pleasant 77 y.o. female who was last seen in our office by Dr. Curt Jews on 04/14/2018.  The patient has a history of bilateral saphenous vein stripping in the remote past.  She has had a very durable result from this.  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  She has been having aching pain and swelling.  Her venous duplex scan at the last visit showed that her great saphenous veins were surgically absent.  She did have reflux in the common femoral veins bilaterally.  Given that she was having continued painful varicose veins thigh-high compression stockings with a gradient of 20-30 were prescribed and she comes in for 79-month follow-up visit.  She continues to have pain in both lower extremities.  She describes aching pain and heaviness in both lower extremities which is aggravated by standing and relieved somewhat with elevation.  Her thigh-high compression stockings have not helped significantly.  She does require pain medicine at times for pain.  There have been no significant changes in her medical history.  She is otherwise very healthy.  Current Outpatient Medications  Medication Sig Dispense Refill  . b complex vitamins tablet Take 1 tablet by mouth daily.    Marland Kitchen BIOTIN PO Take 1 tablet by mouth daily.    . calcium & magnesium carbonates (MYLANTA) 950-932 MG tablet Take 1 tablet by mouth daily.    . Cholecalciferol (VITAMIN D) 2000 units CAPS Take 1 capsule by mouth daily.    . Coenzyme Q10 (COQ10 PO) Take 300 mg by mouth daily.    Marland Kitchen gabapentin (NEURONTIN) 300 MG capsule Take 300 mg by mouth 3 (three) times daily.    Marland Kitchen levothyroxine (SYNTHROID, LEVOTHROID) 137 MCG tablet Take 137 mcg by mouth daily before breakfast.    . LUTEIN PO Take 1 tablet by mouth  daily.    . Potassium 75 MG TABS Take 1 tablet by mouth daily.    . pravastatin (PRAVACHOL) 10 MG tablet Take 1 tablet (10 mg total) by mouth daily. (Patient taking differently: Take 10 mg by mouth 3 (three) times a week. ) 90 tablet 3  . Probiotic Product (PROBIOTIC-10 PO) Take 1 tablet by mouth daily.    Marland Kitchen VITAMIN A PO Take 4,000 mcg by mouth daily.    . Zinc 30 MG TABS Take 1 tablet by mouth daily.    Marland Kitchen aspirin EC 81 MG tablet Take 81 mg by mouth daily.    . predniSONE (DELTASONE) 10 MG tablet Take 10 mg by mouth See admin instructions.  1   No current facility-administered medications for this visit.     REVIEW OF SYSTEMS:  [X]  denotes positive finding, [ ]  denotes negative finding Vascular    Leg swelling    Cardiac    Chest pain or chest pressure:    Shortness of breath upon exertion:    Short of breath when lying flat:    Irregular heart rhythm:    Constitutional    Fever or chills:     PHYSICAL EXAM:   Vitals:   07/23/18 1237  BP: 135/85  Pulse: 76  Resp: 16  Temp: 97.7 F (36.5 C)  TempSrc: Oral  SpO2: 97%  Weight: 180 lb (81.6 kg)  Height: 5\' 2"  (1.575 m)    GENERAL: The patient  is a well-nourished female, in no acute distress. The vital signs are documented above. CARDIOVASCULAR: There is a regular rate and rhythm. PULMONARY: There is good air exchange bilaterally without wheezing or rales. VASCULAR: Arterial: I do not detect carotid bruits.  She has palpable dorsalis pedis pulses. Venous: She has some dilated varicose veins along her anterior distal right thigh extending laterally onto her lateral right leg.  She also has dilated veins in the posterior distal thigh and proximal calves bilaterally.  These are more prominent on the left side.  In addition she has multiple spider veins and telangiectasias.  DATA:   No new data.  MEDICAL ISSUES:   PAINFUL VARICOSE VEINS BILATERALLY: This patient continues to have persistent symptoms in both legs related to  her varicose veins.  She has been elevating her legs, using ibuprofen as needed, and has tried thigh-high compression stockings.  Given her persistent symptoms I think she is a candidate for stab phlebectomy bilaterally.  I would recommend stab phlebectomy (10-20) on the right and then staged stab phlebectomies on the left in the posterior distal thigh and proximal calf.  In addition she will likely require sclerotherapy.  I have discussed with her the indications for the procedure and the potential complications and we will call to schedule this in the near future.  In addition, I have encouraged her to continue with conservative treatment including proper leg elevation, compression stockings, avoiding prolonged sitting and standing, exercise, and weight management.  Deitra Mayo [facility] [phone]

## 2018-08-10 DIAGNOSIS — J3089 Other allergic rhinitis: Secondary | ICD-10-CM | POA: Diagnosis not present

## 2018-08-10 DIAGNOSIS — J301 Allergic rhinitis due to pollen: Secondary | ICD-10-CM | POA: Diagnosis not present

## 2018-08-11 ENCOUNTER — Other Ambulatory Visit: Payer: Self-pay | Admitting: Cardiovascular Disease

## 2018-09-03 ENCOUNTER — Encounter: Payer: Self-pay | Admitting: Vascular Surgery

## 2018-09-03 ENCOUNTER — Ambulatory Visit: Payer: BLUE CROSS/BLUE SHIELD | Admitting: Vascular Surgery

## 2018-09-03 VITALS — BP 141/73 | HR 55 | Temp 97.6°F | Resp 16 | Ht 63.0 in | Wt 172.0 lb

## 2018-09-03 DIAGNOSIS — I83893 Varicose veins of bilateral lower extremities with other complications: Secondary | ICD-10-CM

## 2018-09-03 DIAGNOSIS — I868 Varicose veins of other specified sites: Secondary | ICD-10-CM

## 2018-09-03 NOTE — Progress Notes (Signed)
    Stab Phlebectomy Procedure  Theresa Holmes DOB:04/25/1941  09/03/2018  Consent signed: Yes  Surgeon:C. Scot Dock, MD  Procedure: stab phlebectomy: right leg  BP (!) 141/73   Pulse (!) 55   Temp 97.6 F (36.4 C)   Resp 16   Ht 5\' 3"  (1.6 m)   Wt 172 lb (78 kg)   LMP 11/26/1983   SpO2 100%   BMI 30.47 kg/m   Start time: 9am   End time: 9:50am   Tumescent Anesthesia: 200 cc 0.9% NaCl with 50 cc Lidocaine HCL with 1% Epi and 15 cc 8.4% NaHCO3  Local Anesthesia: 3 cc Lidocaine HCL and NaHCO3 (ratio 2:1)    Stab Phlebectomy: 10-20 Sites: Thigh and Calf  Patient tolerated procedure well: Yes  Notes:   Description of Procedure:  After marking the course of the secondary varicosities, the patient was placed on the operating table in the lateral position, and the right leg was prepped and draped in sterile fashion.    The patient was then put into Trendelenburg position.  Local anesthetic was administered at the previously marked varicosities, and tumescent anesthesia was administered around the vessels.  Ten to 20 stab wounds were made using the tip of an 11 blade. And using the vein hook, the phlebectomies were performed using a hemostat to avulse the varicosities.  Adequate hemostasis was achieved, and steri strips were applied to the stab wound.      ABD pads and thigh high compression stockings were applied as well ace wraps where needed. Blood loss was less than 15 cc.  The patient ambulated out of the operating room having tolerated the procedure well.

## 2018-09-03 NOTE — Progress Notes (Signed)
Patient name: Theresa Holmes MRN: [id number] DOB: 24-Dec-1940 Sex: female  REASON FOR VISIT:   For stab phlebectomies.  HPI:   Theresa Holmes is a pleasant 77 y.o. female who I last saw on 07/23/2018.  She has painful varicose veins bilaterally.  She has persistent symptoms despite conservative treatment.  I felt she was a candidate for stab phlebectomies bilaterally.  I recommended 10-20 stab phlebectomies on the right and then we would do stage phlebectomies in the left leg.  In addition she will require sclerotherapy.  She comes in today for the procedure.  There have been no significant changes to her medical history.  Current Outpatient Medications  Medication Sig Dispense Refill  . aspirin EC 81 MG tablet Take 81 mg by mouth daily.    Marland Kitchen b complex vitamins tablet Take 1 tablet by mouth daily.    Marland Kitchen BIOTIN PO Take 1 tablet by mouth daily.    . calcium & magnesium carbonates (MYLANTA) 081-448 MG tablet Take 1 tablet by mouth daily.    . Cholecalciferol (VITAMIN D) 2000 units CAPS Take 1 capsule by mouth daily.    . Coenzyme Q10 (COQ10 PO) Take 300 mg by mouth daily.    Marland Kitchen gabapentin (NEURONTIN) 300 MG capsule Take 300 mg by mouth 3 (three) times daily.    Marland Kitchen levothyroxine (SYNTHROID, LEVOTHROID) 137 MCG tablet Take 137 mcg by mouth daily before breakfast.    . LUTEIN PO Take 1 tablet by mouth daily.    . Potassium 75 MG TABS Take 1 tablet by mouth daily.    . pravastatin (PRAVACHOL) 10 MG tablet Take 1 tablet (10 mg total) by mouth 3 (three) times a week. 45 tablet 2  . predniSONE (DELTASONE) 10 MG tablet Take 10 mg by mouth See admin instructions.  1  . Probiotic Product (PROBIOTIC-10 PO) Take 1 tablet by mouth daily.    Marland Kitchen VITAMIN A PO Take 4,000 mcg by mouth daily.    . Zinc 30 MG TABS Take 1 tablet by mouth daily.     No current facility-administered medications for this visit.     REVIEW OF SYSTEMS:  [X]  denotes positive finding, [ ]  denotes negative finding Vascular    Leg  swelling    Cardiac    Chest pain or chest pressure:    Shortness of breath upon exertion:    Short of breath when lying flat:    Irregular heart rhythm:    Constitutional    Fever or chills:     PHYSICAL EXAM:   Vitals:   09/03/18 0849  BP: (!) 141/73  Pulse: (!) 55  Resp: 16  Temp: 97.6 F (36.4 C)  SpO2: 100%  Weight: 172 lb (78 kg)  Height: 5\' 3"  (1.6 m)    GENERAL: The patient is a well-nourished female, in no acute distress. The vital signs are documented above.  DATA:   No new data  MEDICAL ISSUES:   10-20 STAB PHLEBECTOMIES RIGHT LOWER EXTREMITY: The patient was taken to the exam room and the varicose veins were marked with the patient standing.  Patient was then placed left side down in the lateral right leg was prepped and draped in usual sterile fashion.  Skin was infiltrated with 1% lidocaine.  Tumescent anesthesia was infiltrated beneath all the marked areas.  Next using approximately 15-20 small stab incisions with an 11 blade the veins were retracted into the wound and then bluntly removed with a hemostat.  Pressure was held for hemostasis.  Sterile dressing was applied and a pressure dressing was applied.  Patient tolerated the procedure well.   Deitra Mayo [facility] [phone]

## 2018-09-17 ENCOUNTER — Telehealth: Payer: Self-pay | Admitting: Obstetrics & Gynecology

## 2018-09-17 NOTE — Telephone Encounter (Signed)
Left patient a message to call back to reschedule a future appointment that was cancelled by the provider on 09/20/18 for her AEX.

## 2018-09-21 ENCOUNTER — Ambulatory Visit: Payer: BLUE CROSS/BLUE SHIELD | Admitting: Obstetrics & Gynecology

## 2018-09-24 ENCOUNTER — Encounter: Payer: Self-pay | Admitting: Vascular Surgery

## 2018-09-24 ENCOUNTER — Ambulatory Visit: Payer: Self-pay | Admitting: Vascular Surgery

## 2018-09-24 VITALS — BP 147/88 | HR 74 | Temp 97.8°F | Resp 16 | Ht 63.0 in | Wt 172.0 lb

## 2018-09-24 DIAGNOSIS — I83812 Varicose veins of left lower extremities with pain: Secondary | ICD-10-CM

## 2018-09-24 DIAGNOSIS — I868 Varicose veins of other specified sites: Secondary | ICD-10-CM

## 2018-09-24 NOTE — Progress Notes (Signed)
    Stab Phlebectomy Procedure  Theresa Holmes DOB:1941/10/05  09/24/2018  Consent signed: Yes  Surgeon:C. Scot Dock, MD  Procedure: stab phlebectomy: left leg  BP (!) 147/88   Pulse 74   Temp 97.8 F (36.6 C)   Resp 16   Ht 5\' 3"  (1.6 m)   Wt 172 lb (78 kg)   LMP 11/26/1983   SpO2 98%   BMI 30.47 kg/m   Start time: 9:05   End time: 9:45   Tumescent Anesthesia: 225 cc 0.9% NaCl with 50 cc Lidocaine HCL with 1% Epi and 15 cc 8.4% NaHCO3  Local Anesthesia: 2 cc Lidocaine HCL and NaHCO3 (ratio 2:1)    Stab Phlebectomy: 10-20 Sites: Thigh and Calf  Patient tolerated procedure well: Yes  Notes:   Description of Procedure:  After marking the course of the secondary varicosities, the patient was placed on the operating table in the prone position, and the left leg was prepped and draped in sterile fashion.    The patient was then put into Trendelenburg position.  Local anesthetic was administered at the previously marked varicosities, and tumescent anesthesia was administered around the vessels.  Ten to 20 stab wounds were made using the tip of an 11 blade. And using the vein hook, the phlebectomies were performed using a hemostat to avulse the varicosities.  Adequate hemostasis was achieved, and steri strips were applied to the stab wound.      ABD pads and thigh high compression stockings were applied as well ace wraps where needed. Blood loss was less than 15 cc.  The patient ambulated out of the operating room having tolerated the procedure well.

## 2018-09-24 NOTE — Progress Notes (Signed)
Patient name: Theresa Holmes MRN: [id number] DOB: [birth year] Sex: female  REASON FOR VISIT:   For stab phlebectomies left leg  HPI:   Theresa Holmes is a pleasant 77 y.o. female who recently underwent stab phlebectomies for varicose veins of her right leg.  She returns for staged stab phlebectomies on the left.  Current Outpatient Medications  Medication Sig Dispense Refill  . aspirin EC 81 MG tablet Take 81 mg by mouth daily.    Marland Kitchen b complex vitamins tablet Take 1 tablet by mouth daily.    Marland Kitchen BIOTIN PO Take 1 tablet by mouth daily.    . calcium & magnesium carbonates (MYLANTA) 790-240 MG tablet Take 1 tablet by mouth daily.    . Cholecalciferol (VITAMIN D) 2000 units CAPS Take 1 capsule by mouth daily.    . Coenzyme Q10 (COQ10 PO) Take 300 mg by mouth daily.    Marland Kitchen gabapentin (NEURONTIN) 300 MG capsule Take 300 mg by mouth 3 (three) times daily.    Marland Kitchen levothyroxine (SYNTHROID, LEVOTHROID) 137 MCG tablet Take 137 mcg by mouth daily before breakfast.    . LUTEIN PO Take 1 tablet by mouth daily.    . Potassium 75 MG TABS Take 1 tablet by mouth daily.    . pravastatin (PRAVACHOL) 10 MG tablet Take 1 tablet (10 mg total) by mouth 3 (three) times a week. 45 tablet 2  . predniSONE (DELTASONE) 10 MG tablet Take 10 mg by mouth See admin instructions.  1  . Probiotic Product (PROBIOTIC-10 PO) Take 1 tablet by mouth daily.    Marland Kitchen VITAMIN A PO Take 4,000 mcg by mouth daily.    . Zinc 30 MG TABS Take 1 tablet by mouth daily.     No current facility-administered medications for this visit.     REVIEW OF SYSTEMS:  [X]  denotes positive finding, [ ]  denotes negative finding Vascular    Leg swelling    Cardiac    Chest pain or chest pressure:    Shortness of breath upon exertion:    Short of breath when lying flat:    Irregular heart rhythm:    Constitutional    Fever or chills:     PHYSICAL EXAM:   Vitals:   09/24/18 0857  BP: (!) 147/88  Pulse: 74  Resp: 16  Temp: 97.8 F (36.6 C)    SpO2: 98%  Weight: 172 lb (78 kg)  Height: 5\' 3"  (1.6 m)    GENERAL: The patient is a well-nourished female, in no acute distress. The vital signs are documented above. CARDIOVASCULAR: There is a regular rate and rhythm. PULMONARY: There is good air exchange bilaterally without wheezing or rales. Her stab incisions on the right leg are healing nicely. DATA:   No new data  MEDICAL ISSUES:   10-20 STAB PHLEBECTOMIES LEFT LEG: The patient was taken to the exam room and her varicose veins were marked with her standing.  These were all in the posterior aspect of her thigh and calf.  The patient was then placed prone.  The left leg was prepped and draped in usual sterile fashion.  The skin was infiltrated with 1% lidocaine.  Tumescent anesthesia was then infiltrated over all marked areas.  Using multiple stab incisions approximately 15-20, using a hook the vein was retracted above the surface of the skin and then grasped with a hemostat and gently excised using blunt dissection.  Pressure was held for hemostasis.  At the completion of the procedure a sterile dressing  was applied.  Patient tolerated the procedure well.  Deitra Mayo [facility] [phone]

## 2018-10-27 ENCOUNTER — Ambulatory Visit (INDEPENDENT_AMBULATORY_CARE_PROVIDER_SITE_OTHER): Payer: BLUE CROSS/BLUE SHIELD | Admitting: *Deleted

## 2018-10-27 DIAGNOSIS — I83893 Varicose veins of bilateral lower extremities with other complications: Secondary | ICD-10-CM

## 2018-10-27 NOTE — Progress Notes (Signed)
X=.3% Sotradecol administered with a 27g butterfly.  Patient received a total of 18cc.  Treated all spiders I could with 3 syringes. Unable to treat the reticulars. Easy access. Tol well. Will follow prn.  Photos: Yes.    Compression stockings applied: Yes.

## 2018-10-28 ENCOUNTER — Encounter: Payer: Self-pay | Admitting: *Deleted

## 2018-10-30 ENCOUNTER — Ambulatory Visit: Payer: BLUE CROSS/BLUE SHIELD

## 2018-11-12 DIAGNOSIS — H5211 Myopia, right eye: Secondary | ICD-10-CM | POA: Diagnosis not present

## 2018-11-30 DIAGNOSIS — R7301 Impaired fasting glucose: Secondary | ICD-10-CM | POA: Diagnosis not present

## 2018-11-30 DIAGNOSIS — E890 Postprocedural hypothyroidism: Secondary | ICD-10-CM | POA: Diagnosis not present

## 2018-11-30 DIAGNOSIS — R82998 Other abnormal findings in urine: Secondary | ICD-10-CM | POA: Diagnosis not present

## 2018-11-30 DIAGNOSIS — E785 Hyperlipidemia, unspecified: Secondary | ICD-10-CM | POA: Diagnosis not present

## 2018-11-30 DIAGNOSIS — I10 Essential (primary) hypertension: Secondary | ICD-10-CM | POA: Diagnosis not present

## 2018-12-02 DIAGNOSIS — Z1331 Encounter for screening for depression: Secondary | ICD-10-CM | POA: Diagnosis not present

## 2018-12-02 DIAGNOSIS — I10 Essential (primary) hypertension: Secondary | ICD-10-CM | POA: Diagnosis not present

## 2018-12-02 DIAGNOSIS — E7849 Other hyperlipidemia: Secondary | ICD-10-CM | POA: Diagnosis not present

## 2018-12-02 DIAGNOSIS — R7301 Impaired fasting glucose: Secondary | ICD-10-CM | POA: Diagnosis not present

## 2018-12-02 DIAGNOSIS — M5416 Radiculopathy, lumbar region: Secondary | ICD-10-CM | POA: Diagnosis not present

## 2018-12-02 DIAGNOSIS — Z0000 Encounter for general adult medical examination without abnormal findings: Secondary | ICD-10-CM | POA: Diagnosis not present

## 2018-12-08 DIAGNOSIS — M5416 Radiculopathy, lumbar region: Secondary | ICD-10-CM | POA: Diagnosis not present

## 2018-12-11 DIAGNOSIS — M5416 Radiculopathy, lumbar region: Secondary | ICD-10-CM | POA: Diagnosis not present

## 2018-12-14 ENCOUNTER — Encounter (HOSPITAL_COMMUNITY)
Admission: RE | Admit: 2018-12-14 | Discharge: 2018-12-14 | Disposition: A | Discharge status: Home / Self Care | Payer: BLUE CROSS/BLUE SHIELD | Source: Ambulatory Visit | Attending: Orthopedic Surgery | Admitting: Orthopedic Surgery

## 2018-12-14 ENCOUNTER — Ambulatory Visit (HOSPITAL_COMMUNITY): Payer: Self-pay | Admitting: Orthopedic Surgery

## 2018-12-14 ENCOUNTER — Other Ambulatory Visit: Payer: Self-pay

## 2018-12-14 ENCOUNTER — Encounter (HOSPITAL_COMMUNITY): Payer: Self-pay

## 2018-12-14 ENCOUNTER — Encounter (INDEPENDENT_AMBULATORY_CARE_PROVIDER_SITE_OTHER): Payer: BLUE CROSS/BLUE SHIELD | Admitting: Ophthalmology

## 2018-12-14 DIAGNOSIS — Z01818 Encounter for other preprocedural examination: Secondary | ICD-10-CM | POA: Diagnosis not present

## 2018-12-14 DIAGNOSIS — I10 Essential (primary) hypertension: Secondary | ICD-10-CM | POA: Insufficient documentation

## 2018-12-14 HISTORY — DX: Personal history of urinary calculi: Z87.442

## 2018-12-14 LAB — BASIC METABOLIC PANEL
Anion gap: 11 (ref 5–15)
BUN: 17 mg/dL (ref 8–23)
CO2: 26 mmol/L (ref 22–32)
CREATININE: 0.69 mg/dL (ref 0.44–1.00)
Calcium: 8.8 mg/dL — ABNORMAL LOW (ref 8.9–10.3)
Chloride: 104 mmol/L (ref 98–111)
GFR calc Af Amer: >60 mL/min (ref >60)
GFR calc non Af Amer: >60 mL/min (ref >60)
Glucose, Bld: 116 mg/dL — ABNORMAL HIGH (ref 70–99)
Potassium: 4.4 mmol/L (ref 3.5–5.1)
Sodium: 141 mmol/L (ref 135–145)

## 2018-12-14 LAB — ABO/RH: ABO/RH(D): O POS

## 2018-12-14 LAB — SURGICAL PCR SCREEN
MRSA, PCR: NEGATIVE
Staphylococcus aureus: NEGATIVE

## 2018-12-14 LAB — TYPE AND SCREEN
ABO/RH(D): O POS
Antibody Screen: NEGATIVE

## 2018-12-14 LAB — CBC
HCT: 37.9 % (ref 36.0–46.0)
Hemoglobin: 11.3 g/dL — ABNORMAL LOW (ref 12.0–15.0)
MCH: 24.7 pg — ABNORMAL LOW (ref 26.0–34.0)
MCHC: 29.8 g/dL — AB (ref 30.0–36.0)
MCV: 82.9 fL (ref 80.0–100.0)
Platelets: 319 10*3/uL (ref 150–400)
RBC: 4.57 MIL/uL (ref 3.87–5.11)
RDW: 15.9 % — ABNORMAL HIGH (ref 11.5–15.5)
WBC: 6.3 10*3/uL (ref 4.0–10.5)
nRBC: 0 % (ref 0.0–0.2)

## 2018-12-14 NOTE — Progress Notes (Signed)
PCP - Dr. Roque Cash Cardiologist - Dr. Skeet Latch - patient states she no longer sees cardiologist, no cardiac  problem  Chest x-ray -  04/29/18 EKG - 12/14/2018 Stress Test - 10/30/16 ECHO - denies Cardiac Cath - denies  Sleep Study - denies CPAP - N/A   Blood Thinner Instructions: N/A Aspirin Instructions: N/A  Anesthesia review: NO  Patient denies shortness of breath, fever, cough and chest pain at PAT appointment   Patient verbalized understanding of instructions that were given to them at the PAT appointment. Patient was also instructed that they will need to review over the PAT instructions again at home before surgery.

## 2018-12-14 NOTE — Pre-Procedure Instructions (Signed)
Theresa Holmes  12/14/2018    Your procedure is scheduled on Wednesday, December 16, 2018 at 10:30 AM.   Report to [facility] Inpatient Entrance "A" Admitting Office at 8:30 AM.   Call this number if you have problems the morning of surgery: [phone]   Questions prior to day of surgery, please call [phone] between 8 & 4 PM.   Remember:  Do not eat or drink after midnight Tuesday, 12/15/18  Take these medicines the morning of surgery with A SIP OF WATER: Levothyroxine (Synthroid)  Stop NSAIDS (Ibuprofen, Aleve, etc) as of today. Do not use any Aspirin products or Herbal medications prior to surgery.    Do not wear jewelry, make-up or nail polish.  Do not wear lotions, powders, perfumes or deodorant.  Do not shave 48 hours prior to surgery.   Do not bring valuables to the hospital.  [facility] is not responsible for any belongings or valuables.  Contacts, dentures or bridgework may not be worn into surgery.  Leave your suitcase in the car.  After surgery it may be brought to your room.  For patients admitted to the hospital, discharge time will be determined by your treatment team.  [facility] - Preparing for Surgery  Before surgery, you can play an important role.  Because skin is not sterile, your skin needs to be as free of germs as possible.  You can reduce the number of germs on you skin by washing with CHG (chlorahexidine gluconate) soap before surgery.  CHG is an antiseptic cleaner which kills germs and bonds with the skin to continue killing germs even after washing.  Oral Hygiene is also important in reducing the risk of infection.  Remember to brush your teeth with your regular toothpaste the morning of surgery.  Please DO NOT use if you have an allergy to CHG or antibacterial soaps.  If your skin becomes reddened/irritated stop using the CHG and inform your nurse when you arrive at Short Stay.  Do not shave (including legs and underarms) for at least 48 hours  prior to the first CHG shower.  You may shave your face.  Please follow these instructions carefully:   1.  Shower with CHG Soap the night before surgery and the morning of Surgery.  2.  If you choose to wash your hair, wash your hair first as usual with your normal shampoo.  3.  After you shampoo, rinse your hair and body thoroughly to remove the shampoo. 4.  Use CHG as you would any other liquid soap.  You can apply chg directly to the skin and wash gently with a      scrungie or washcloth.           5.  Apply the CHG Soap to your body ONLY FROM THE NECK DOWN.   Do not use on open wounds or open sores. Avoid contact with your eyes, ears, mouth and genitals (private parts).  Wash genitals (private parts) with your normal soap.  6.  Wash thoroughly, paying special attention to the area where your surgery will be performed.  7.  Thoroughly rinse your body with warm water from the neck down.  8.  DO NOT shower/wash with your normal soap after using and rinsing off the CHG Soap.  9.  Pat yourself dry with a clean towel.            10.  Wear clean pajamas.  11.  Place clean sheets on your bed the night of your first shower and do not sleep with pets.  Day of Surgery  Shower as above. Do not apply any lotions/deodorants the morning of surgery.   Please wear clean clothes to the hospital. Remember to brush your teeth with toothpaste.   Please read over the fact sheets that you were given.

## 2018-12-14 NOTE — H&P (Signed)
History of Present Illness History: Theresa Holmes is a very pleasant active 78 who has had long-standing mild to moderate back pain for several years. Approximately 15 years ago she had an L4 S1 instrumented fusion with fixation for back and radicular leg pain. This is improved but recently she has had a recurrence of debilitating pain. Over the last year her back pain has gotten progressively worse but she is been able to manage this with self directed exercises and over-the-counter medications. Over the last 3 months she developed horrific radicular right leg pain which has significantly hindered her quality-of-life. She states that the new onset of severe right radicular leg pain along with her progressive worsening back pain is simply too much to bear. There is been no recent injury or trauma or specific event. She recently had an L3-4 right-sided transforaminal ESI which did not provide any significant relief. As a result of the progressive debilitating pain she is referred to me today for surgical management.  Allergies NKDA  Medications azithromycin 500 mg tablet cefUROXime axetiL 250 mg tablet celecoxib 200 mg capsule cephALEXin 500 mg capsule diphenoxylate-atropine 2.5 mg-0.025 mg tablet esomeprazole magnesium 40 mg capsule,delayed release Fluzone High-Dose 2018-2019 (PF) 180 mcg/0.5 mL intramuscular syringe Fluzone High-Dose 2019-20 (PF) 180 mcg/0.5 mL intramuscular syringe ADM 0.5ML IM UTD gabapentin 300 mg capsule HYDROcodone 5 mg-acetaminophen 325 mg tablet levothyroxine 137 mcg tablet Lotemax 0.5 % eye gel drops montelukast 10 mg tablet ondansetron HCL 4 mg tablet pantoprazole 40 mg tablet,delayed release pravastatin 10 mg tablet predniSONE 10 mg tablet predniSONE 5 mg tablet Shingrix (PF) 50 mcg/0.5 mL intramuscular suspension Tuzistra XR 14.7 mg-2.8 mg/5 mL oral suspension,extended release  Problems 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 - Onset: 12/11/2018 History of lumbar fusion - Onset: 12/11/2018 Lumbar radiculopathy - Onset: 04/07/2018 Pain of left hand - Onset: 09/23/2018 Thigh pain - Onset: 04/04/2018, Bilateral  Review of Systems Constitutional Constitutional: no fever, no chills, no night sweats, no significant weight loss  Cardiovascular Cardiovascular: no chest pain, no palpitations  Respiratory Respiratory: no cough, no shortness of breath, No COPD  Gastrointestinal Gastrointestinal: no vomiting, no nausea  Musculoskeletal Musculoskeletal: no joint pain, no swelling in Joints  Neurologic Neurologic: no numbness, no tingling, no difficulty with balance  Physical Exam Clinical exam: Ellorie is a pleasant individual, who appears younger than their stated age. She Is alert and orientated 3. No shortness of breath, chest pain. Abdomen is soft and non-tender, negative loss of bowel and bladder control, no rebound tenderness. Negative: skin lesions abrasions contusions  Lungs: Clear auscultation bilaterally  Cardiac: No rubs gallops murmurs regular rate and rhythm  Peripheral pulses: 1+ dorsalis pedis/posterior tibialis pulses bilaterally. Compartment soft and nontender.  Gait pattern: Significant back and right leg pain producing a limp affecting her gait.  Assistive devices: Occasionally will use a cane  Neuro: Positive numbness and dysesthesias primarily in the L3 dermatome on the right side. Positive femoral stretch test on the right side, negative straight leg raise test. Negative Babinski test, negative Hoffman test, 1+ deep tendon reflexes at the knee and Achilles bilaterally. 5/5 motor strength in the lower extremity, gait pattern is significantly hindered because of back buttock and right radicular thigh pain.  Musculoskeletal: Moderate to severe back pain with palpation and range of motion especially extension of the spine. Patient maintains a forward flexed posture. No  hip, knee, ankle pain with isolated joint range of motion.  X-rays of the lumbar spine  demonstrate retrolisthesis at L3-4 with significant foraminal compromise. There is degenerative disc disease at L3-4 as well. Solid L4-S1 instrumentation and fusion. No acute fracture seen.  Lumbar MRI dated 12/02/18: Severe canal stenosis due to retro-listhesis of L3 on L4. Significant foraminal stenosis bilaterally. Moderate to severe left lateral recess stenosis due to slight retroposition of L2 on L3 no central stenosis. No canal stenosis status post decompression and fusion of L4-5/L5-S1. Multiple bilateral renal cysts.    Assessment/Plan: Diagnosis: [name] is a very pleasant active 78 year old woman who has had severe debilitating back buttock and right radicular leg pain. Patient's clinical symptomatology is consistent with L3 neural compromise. Primary pain generator is most likely the retrolisthesis at L3-4 causing severe foraminal stenosis on the right side.  While the MRI does show disease at L1-2 and L2-3 it is more prominent on the right side at L3-4. There is no significant right-sided disease at L1-2 or L2-3. Patient has adjacent segment degenerative retrolisthesis at L3-4 with loss of disc space height and foraminal volume. This is collectively leading to severe right L3 nerve compression and irritation.  I had a long discussion with patient and her husband Dr. Latanya Maudlin about surgical management. Given the position of the pedicle screws, her previous posterior fixation, and the retrolisthesis at L3-4 I am concerned that a straightforward posterior decompression will lead to worsening of the her retrolisthesis and more pronounced issues. In order to address the foraminal disease more than likely it would require a Gill decompression which would involve dissection through the existing scar tissue and possibly removal and repositioning of her hardware. Alternative to this would be a lateral interbody  fusion. This would allow for indirect foraminal decompression and stabilization. This would address not only the neural compression in the foramen but also the retrolisthesis at that level. I did tell her that there is a chance in the future that the level above could break down and become symptomatic and require additional surgery. Furthermore if the indirect decompression was not adequate enough a posterior based decompression would still be required. However I do believe the indirect foraminal decompression from the lateral interbody fusion would be adequate enough to reduce the radicular leg pain and decrease her existing back pain.  I have gone over the surgical procedure in great detail with the patient as well as the risks and benefits. All of her questions were encouraged and addressed.  Risks, benefits of surgery were reviewed with the patient. These include: infection, bleeding, death, stroke, paralysis, ongoing or worse pain, need for additional surgery, injury to the lumbar plexus resulting in hip flexor weakness and difficulty walking without assistive devices. Adjacent segment degenerative disease, need for additional surgery including fusing other levels, leak of spinal fluid, Nonunion, hardware failure, breakage, or mal-position. Deep venous thrombosis (DVT) requiring additional treatment such as filter, and/or medications. Injury to abdominal contents, loss in bowel and bladder control.  Treatment plan: 1. Plan on lateral interbody fusion with lateral plating.

## 2018-12-14 NOTE — Pre-Procedure Instructions (Signed)
EQUILLA QUE  12/14/2018    Your procedure is scheduled on Wednesday, December 14, 2018 at 10:30 AM.   Report to [facility] Entrance "A" Admitting Office at 8:30 AM.   Call this number if you have problems the morning of surgery: [phone]   Questions prior to day of surgery, please call [phone] between 8 & 4 PM.   Remember:  Do not eat or drink after midnight Tuesday, 12/15/18  Take these medicines the morning of surgery with A SIP OF WATER: Levothyroxine (Synthroid)  Stop NSAIDS (Ibuprofen, Aleve, etc) as of today. Do not use any Aspirin products or Herbal medications prior to surgery.    Do not wear jewelry, make-up or nail polish.  Do not wear lotions, powders, perfumes or deodorant.  Do not shave 48 hours prior to surgery.   Do not bring valuables to the hospital.  [facility] is not responsible for any belongings or valuables.  Contacts, dentures or bridgework may not be worn into surgery.  Leave your suitcase in the car.  After surgery it may be brought to your room.  For patients admitted to the hospital, discharge time will be determined by your treatment team.  [facility] - Preparing for Surgery  Before surgery, you can play an important role.  Because skin is not sterile, your skin needs to be as free of germs as possible.  You can reduce the number of germs on you skin by washing with CHG (chlorahexidine gluconate) soap before surgery.  CHG is an antiseptic cleaner which kills germs and bonds with the skin to continue killing germs even after washing.  Oral Hygiene is also important in reducing the risk of infection.  Remember to brush your teeth with your regular toothpaste the morning of surgery.  Please DO NOT use if you have an allergy to CHG or antibacterial soaps.  If your skin becomes reddened/irritated stop using the CHG and inform your nurse when you arrive at Short Stay.  Do not shave (including legs and underarms) for at least 48 hours  prior to the first CHG shower.  You may shave your face.  Please follow these instructions carefully:   1.  Shower with CHG Soap the night before surgery and the morning of Surgery.  2.  If you choose to wash your hair, wash your hair first as usual with your normal shampoo.  3.  After you shampoo, rinse your hair and body thoroughly to remove the shampoo. 4.  Use CHG as you would any other liquid soap.  You can apply chg directly to the skin and wash gently with a      scrungie or washcloth.           5.  Apply the CHG Soap to your body ONLY FROM THE NECK DOWN.   Do not use on open wounds or open sores. Avoid contact with your eyes, ears, mouth and genitals (private parts).  Wash genitals (private parts) with your normal soap.  6.  Wash thoroughly, paying special attention to the area where your surgery will be performed.  7.  Thoroughly rinse your body with warm water from the neck down.  8.  DO NOT shower/wash with your normal soap after using and rinsing off the CHG Soap.  9.  Pat yourself dry with a clean towel.            10.  Wear clean pajamas.  11.  Place clean sheets on your bed the night of your first shower and do not sleep with pets.  Day of Surgery  Shower as above. Do not apply any lotions/deodorants the morning of surgery.   Please wear clean clothes to the hospital. Remember to brush your teeth with toothpaste.   Please read over the fact sheets that you were given.

## 2018-12-15 MED ORDER — TRANEXAMIC ACID-NACL 1000-0.7 MG/100ML-% IV SOLN
1000.0000 mg | INTRAVENOUS | Status: AC
  Administered 2018-12-16: 1000 mg via INTRAVENOUS
  Filled 2018-12-15: qty 100

## 2018-12-16 ENCOUNTER — Inpatient Hospital Stay (HOSPITAL_COMMUNITY): Payer: BLUE CROSS/BLUE SHIELD | Admitting: Anesthesiology

## 2018-12-16 ENCOUNTER — Other Ambulatory Visit: Payer: Self-pay

## 2018-12-16 ENCOUNTER — Inpatient Hospital Stay (HOSPITAL_COMMUNITY): Payer: BLUE CROSS/BLUE SHIELD

## 2018-12-16 ENCOUNTER — Encounter (HOSPITAL_COMMUNITY)
Admission: RE | Disposition: A | Discharge status: Home / Self Care | Payer: Self-pay | Source: Home / Self Care | Attending: Orthopedic Surgery

## 2018-12-16 ENCOUNTER — Encounter (HOSPITAL_COMMUNITY): Payer: Self-pay

## 2018-12-16 ENCOUNTER — Inpatient Hospital Stay (HOSPITAL_COMMUNITY)
Admission: RE | Admit: 2018-12-16 | Discharge: 2018-12-18 | DRG: 460 | Disposition: A | Discharge status: Home / Self Care | Payer: BLUE CROSS/BLUE SHIELD | Attending: Orthopedic Surgery | Admitting: Orthopedic Surgery

## 2018-12-16 DIAGNOSIS — K219 Gastro-esophageal reflux disease without esophagitis: Secondary | ICD-10-CM | POA: Diagnosis not present

## 2018-12-16 DIAGNOSIS — M51369 Other intervertebral disc degeneration, lumbar region without mention of lumbar back pain or lower extremity pain: Secondary | ICD-10-CM | POA: Diagnosis present

## 2018-12-16 DIAGNOSIS — Z79899 Other long term (current) drug therapy: Secondary | ICD-10-CM | POA: Diagnosis not present

## 2018-12-16 DIAGNOSIS — M5116 Intervertebral disc disorders with radiculopathy, lumbar region: Principal | ICD-10-CM | POA: Diagnosis present

## 2018-12-16 DIAGNOSIS — R110 Nausea: Secondary | ICD-10-CM | POA: Diagnosis not present

## 2018-12-16 DIAGNOSIS — E039 Hypothyroidism, unspecified: Secondary | ICD-10-CM | POA: Diagnosis not present

## 2018-12-16 DIAGNOSIS — E7800 Pure hypercholesterolemia, unspecified: Secondary | ICD-10-CM | POA: Diagnosis not present

## 2018-12-16 DIAGNOSIS — Z6832 Body mass index (BMI) 32.0-32.9, adult: Secondary | ICD-10-CM

## 2018-12-16 DIAGNOSIS — M4326 Fusion of spine, lumbar region: Secondary | ICD-10-CM | POA: Diagnosis not present

## 2018-12-16 DIAGNOSIS — M48062 Spinal stenosis, lumbar region with neurogenic claudication: Secondary | ICD-10-CM | POA: Diagnosis not present

## 2018-12-16 DIAGNOSIS — M48061 Spinal stenosis, lumbar region without neurogenic claudication: Secondary | ICD-10-CM | POA: Diagnosis not present

## 2018-12-16 DIAGNOSIS — I10 Essential (primary) hypertension: Secondary | ICD-10-CM | POA: Diagnosis present

## 2018-12-16 DIAGNOSIS — M5136 Other intervertebral disc degeneration, lumbar region: Secondary | ICD-10-CM | POA: Diagnosis present

## 2018-12-16 DIAGNOSIS — E669 Obesity, unspecified: Secondary | ICD-10-CM | POA: Diagnosis not present

## 2018-12-16 DIAGNOSIS — M5416 Radiculopathy, lumbar region: Secondary | ICD-10-CM | POA: Diagnosis not present

## 2018-12-16 DIAGNOSIS — Z419 Encounter for procedure for purposes other than remedying health state, unspecified: Secondary | ICD-10-CM

## 2018-12-16 DIAGNOSIS — M4316 Spondylolisthesis, lumbar region: Secondary | ICD-10-CM | POA: Diagnosis not present

## 2018-12-16 HISTORY — PX: ANTERIOR LAT LUMBAR FUSION: SHX1168

## 2018-12-16 SURGERY — ANTERIOR LATERAL LUMBAR FUSION 1 LEVEL
Anesthesia: General | Site: Back

## 2018-12-16 MED ORDER — ONDANSETRON HCL 4 MG/2ML IJ SOLN: 4.0000 mg | Freq: Four times a day (QID) | INTRAMUSCULAR | Status: DC | PRN

## 2018-12-16 MED ORDER — ACETAMINOPHEN 10 MG/ML IV SOLN
INTRAVENOUS | Status: DC | PRN
  Administered 2018-12-16: 1000 mg via INTRAVENOUS

## 2018-12-16 MED ORDER — SODIUM CHLORIDE 0.9% FLUSH: 3.0000 mL | INTRAVENOUS | Status: DC | PRN

## 2018-12-16 MED ORDER — HYDROMORPHONE HCL 1 MG/ML IJ SOLN
INTRAMUSCULAR | Status: AC
  Administered 2018-12-16: 0.5 mg via INTRAVENOUS
  Filled 2018-12-16: qty 1

## 2018-12-16 MED ORDER — CEFAZOLIN SODIUM-DEXTROSE 2-4 GM/100ML-% IV SOLN
2.0000 g | INTRAVENOUS | Status: AC
  Administered 2018-12-16: 2 g via INTRAVENOUS
  Filled 2018-12-16: qty 100

## 2018-12-16 MED ORDER — DEXAMETHASONE SODIUM PHOSPHATE 10 MG/ML IJ SOLN
INTRAMUSCULAR | Status: DC | PRN
  Administered 2018-12-16: 5 mg via INTRAVENOUS

## 2018-12-16 MED ORDER — MIDAZOLAM HCL 2 MG/2ML IJ SOLN
INTRAMUSCULAR | Status: AC
  Filled 2018-12-16: qty 2

## 2018-12-16 MED ORDER — THROMBIN (RECOMBINANT) 20000 UNITS EX SOLR
CUTANEOUS | Status: AC
  Filled 2018-12-16: qty 20000

## 2018-12-16 MED ORDER — CEFAZOLIN SODIUM-DEXTROSE 2-3 GM-%(50ML) IV SOLR
INTRAVENOUS | Status: DC | PRN
  Administered 2018-12-16: 2 g via INTRAVENOUS

## 2018-12-16 MED ORDER — EPHEDRINE SULFATE 50 MG/ML IJ SOLN
INTRAMUSCULAR | Status: DC | PRN
  Administered 2018-12-16 (×2): 10 mg via INTRAVENOUS
  Administered 2018-12-16: 5 mg via INTRAVENOUS

## 2018-12-16 MED ORDER — DEXAMETHASONE SODIUM PHOSPHATE 10 MG/ML IJ SOLN
INTRAMUSCULAR | Status: AC
  Filled 2018-12-16: qty 1

## 2018-12-16 MED ORDER — OXYCODONE HCL 5 MG PO TABS
10.0000 mg | ORAL_TABLET | ORAL | Status: DC | PRN
  Administered 2018-12-16: 10 mg via ORAL
  Filled 2018-12-16: qty 2

## 2018-12-16 MED ORDER — HYDROMORPHONE HCL 1 MG/ML IJ SOLN
0.2500 mg | INTRAMUSCULAR | Status: DC | PRN
  Administered 2018-12-16 (×4): 0.5 mg via INTRAVENOUS

## 2018-12-16 MED ORDER — PROPOFOL 10 MG/ML IV BOLUS
INTRAVENOUS | Status: DC | PRN
  Administered 2018-12-16: 150 mg via INTRAVENOUS
  Administered 2018-12-16: 30 mg via INTRAVENOUS

## 2018-12-16 MED ORDER — ACETAMINOPHEN 325 MG PO TABS
650.0000 mg | ORAL_TABLET | ORAL | Status: DC | PRN
  Administered 2018-12-16: 650 mg via ORAL
  Filled 2018-12-16: qty 2

## 2018-12-16 MED ORDER — MORPHINE SULFATE (PF) 2 MG/ML IV SOLN: 2.0000 mg | INTRAVENOUS | Status: AC | PRN

## 2018-12-16 MED ORDER — ONDANSETRON HCL 4 MG/2ML IJ SOLN: 4.0000 mg | Freq: Once | INTRAMUSCULAR | Status: DC | PRN

## 2018-12-16 MED ORDER — ONDANSETRON HCL 4 MG/2ML IJ SOLN
INTRAMUSCULAR | Status: AC
  Filled 2018-12-16: qty 2

## 2018-12-16 MED ORDER — ONDANSETRON HCL 4 MG/2ML IJ SOLN
INTRAMUSCULAR | Status: DC | PRN
  Administered 2018-12-16: 4 mg via INTRAVENOUS

## 2018-12-16 MED ORDER — BUPIVACAINE-EPINEPHRINE 0.25% -1:200000 IJ SOLN
INTRAMUSCULAR | Status: DC | PRN
  Administered 2018-12-16: 17 mL

## 2018-12-16 MED ORDER — FENTANYL CITRATE (PF) 100 MCG/2ML IJ SOLN
INTRAMUSCULAR | Status: DC | PRN
  Administered 2018-12-16 (×5): 50 ug via INTRAVENOUS

## 2018-12-16 MED ORDER — SUCCINYLCHOLINE CHLORIDE 200 MG/10ML IV SOSY
PREFILLED_SYRINGE | INTRAVENOUS | Status: AC
  Filled 2018-12-16: qty 10

## 2018-12-16 MED ORDER — METHOCARBAMOL 1000 MG/10ML IJ SOLN
500.0000 mg | Freq: Four times a day (QID) | INTRAVENOUS | Status: DC | PRN
  Filled 2018-12-16: qty 5

## 2018-12-16 MED ORDER — SODIUM CHLORIDE 0.9 % IV SOLN: 250.0000 mL | INTRAVENOUS | Status: DC

## 2018-12-16 MED ORDER — MEPERIDINE HCL 50 MG/ML IJ SOLN: 6.2500 mg | INTRAMUSCULAR | Status: DC | PRN

## 2018-12-16 MED ORDER — LACTATED RINGERS IV SOLN
INTRAVENOUS | Status: DC
  Administered 2018-12-16 (×2): via INTRAVENOUS

## 2018-12-16 MED ORDER — POLYETHYLENE GLYCOL 3350 17 G PO PACK: 17.0000 g | PACK | Freq: Every day | ORAL | Status: DC | PRN

## 2018-12-16 MED ORDER — LIDOCAINE 2% (20 MG/ML) 5 ML SYRINGE
INTRAMUSCULAR | Status: DC | PRN
  Administered 2018-12-16: 60 mg via INTRAVENOUS
  Administered 2018-12-16: 40 mg via INTRAVENOUS

## 2018-12-16 MED ORDER — LACTATED RINGERS IV SOLN: INTRAVENOUS | Status: DC

## 2018-12-16 MED ORDER — HYDROXYZINE HCL 50 MG/ML IM SOLN
50.0000 mg | Freq: Four times a day (QID) | INTRAMUSCULAR | Status: DC | PRN
  Administered 2018-12-16 – 2018-12-17 (×2): 50 mg via INTRAMUSCULAR
  Filled 2018-12-16 (×2): qty 1

## 2018-12-16 MED ORDER — LEVOTHYROXINE SODIUM 137 MCG PO TABS
137.0000 ug | ORAL_TABLET | Freq: Every day | ORAL | Status: DC
  Administered 2018-12-17 – 2018-12-18 (×2): 137 ug via ORAL
  Filled 2018-12-16 (×2): qty 1

## 2018-12-16 MED ORDER — PHENYLEPHRINE 40 MCG/ML (10ML) SYRINGE FOR IV PUSH (FOR BLOOD PRESSURE SUPPORT)
PREFILLED_SYRINGE | INTRAVENOUS | Status: AC
  Filled 2018-12-16: qty 10

## 2018-12-16 MED ORDER — FENTANYL CITRATE (PF) 250 MCG/5ML IJ SOLN
INTRAMUSCULAR | Status: AC
  Filled 2018-12-16: qty 5

## 2018-12-16 MED ORDER — ACETAMINOPHEN 650 MG RE SUPP: 650.0000 mg | RECTAL | Status: DC | PRN

## 2018-12-16 MED ORDER — SODIUM CHLORIDE 0.9% FLUSH
3.0000 mL | Freq: Two times a day (BID) | INTRAVENOUS | Status: DC
  Administered 2018-12-17: 3 mL via INTRAVENOUS

## 2018-12-16 MED ORDER — PROPOFOL 500 MG/50ML IV EMUL
INTRAVENOUS | Status: DC | PRN
  Administered 2018-12-16: 50 ug/kg/min via INTRAVENOUS

## 2018-12-16 MED ORDER — LIDOCAINE 2% (20 MG/ML) 5 ML SYRINGE
INTRAMUSCULAR | Status: AC
  Filled 2018-12-16: qty 5

## 2018-12-16 MED ORDER — METHOCARBAMOL 500 MG PO TABS
500.0000 mg | ORAL_TABLET | Freq: Four times a day (QID) | ORAL | Status: DC | PRN
  Administered 2018-12-16 (×2): 500 mg via ORAL
  Filled 2018-12-16 (×2): qty 1

## 2018-12-16 MED ORDER — PROPOFOL 10 MG/ML IV BOLUS
INTRAVENOUS | Status: AC
  Filled 2018-12-16: qty 40

## 2018-12-16 MED ORDER — PHENOL 1.4 % MT LIQD: 1.0000 | OROMUCOSAL | Status: DC | PRN

## 2018-12-16 MED ORDER — OXYCODONE HCL 5 MG PO TABS
5.0000 mg | ORAL_TABLET | ORAL | Status: DC | PRN
  Administered 2018-12-16: 5 mg via ORAL
  Filled 2018-12-16: qty 1

## 2018-12-16 MED ORDER — MENTHOL 3 MG MT LOZG: 1.0000 | LOZENGE | OROMUCOSAL | Status: DC | PRN

## 2018-12-16 MED ORDER — ONDANSETRON HCL 4 MG PO TABS
4.0000 mg | ORAL_TABLET | Freq: Four times a day (QID) | ORAL | Status: DC | PRN
  Administered 2018-12-17: 4 mg via ORAL
  Filled 2018-12-16: qty 1

## 2018-12-16 MED ORDER — ACETAMINOPHEN 10 MG/ML IV SOLN
INTRAVENOUS | Status: AC
  Filled 2018-12-16: qty 100

## 2018-12-16 MED ORDER — 0.9 % SODIUM CHLORIDE (POUR BTL) OPTIME
TOPICAL | Status: DC | PRN
  Administered 2018-12-16 (×2): 1000 mL

## 2018-12-16 MED ORDER — HEMOSTATIC AGENTS (NO CHARGE) OPTIME
TOPICAL | Status: DC | PRN
  Administered 2018-12-16: 1

## 2018-12-16 MED ORDER — CEFAZOLIN SODIUM-DEXTROSE 1-4 GM/50ML-% IV SOLN
1.0000 g | Freq: Three times a day (TID) | INTRAVENOUS | Status: AC
  Administered 2018-12-16 (×2): 1 g via INTRAVENOUS
  Filled 2018-12-16 (×2): qty 50

## 2018-12-16 SURGICAL SUPPLY — 67 items
BLADE CLIPPER SURG (BLADE) IMPLANT
BLADE SURG 10 STRL SS (BLADE) ×2 IMPLANT
BONE MATRIX OSTEOCEL PRO SM (Bone Implant) ×2 IMPLANT
CAGE MODULUS XL 12X18X50 - 10 (Cage) ×2 IMPLANT
CATH FOLEY 2WAY SLVR  5CC 16FR (CATHETERS) ×1
CATH FOLEY 2WAY SLVR 5CC 16FR (CATHETERS) ×1 IMPLANT
CLSR STERI-STRIP ANTIMIC 1/2X4 (GAUZE/BANDAGES/DRESSINGS) ×2 IMPLANT
COVER SURGICAL LIGHT HANDLE (MISCELLANEOUS) ×2 IMPLANT
COVER WAND RF STERILE (DRAPES) ×2 IMPLANT
DRAPE C-ARM 42X72 X-RAY (DRAPES) ×2 IMPLANT
DRAPE C-ARMOR (DRAPES) ×2 IMPLANT
DRAPE INCISE IOBAN 66X45 STRL (DRAPES) ×2 IMPLANT
DRSG OPSITE POSTOP 3X4 (GAUZE/BANDAGES/DRESSINGS) ×2 IMPLANT
DRSG OPSITE POSTOP 4X6 (GAUZE/BANDAGES/DRESSINGS) ×2 IMPLANT
DURAPREP 26ML APPLICATOR (WOUND CARE) ×2 IMPLANT
ELECT BLADE 4.0 EZ CLEAN MEGAD (MISCELLANEOUS) ×2
ELECT PENCIL ROCKER SW 15FT (MISCELLANEOUS) ×2 IMPLANT
ELECT REM PT RETURN 9FT ADLT (ELECTROSURGICAL) ×2
ELECTRODE BLDE 4.0 EZ CLN MEGD (MISCELLANEOUS) ×1 IMPLANT
ELECTRODE REM PT RTRN 9FT ADLT (ELECTROSURGICAL) ×1 IMPLANT
FLOSEAL 10ML (HEMOSTASIS) IMPLANT
GLOVE BIO SURGEON STRL SZ 6.5 (GLOVE) ×2 IMPLANT
GLOVE BIOGEL PI IND STRL 6.5 (GLOVE) ×1 IMPLANT
GLOVE BIOGEL PI IND STRL 8.5 (GLOVE) ×1 IMPLANT
GLOVE BIOGEL PI INDICATOR 6.5 (GLOVE) ×1
GLOVE BIOGEL PI INDICATOR 8.5 (GLOVE) ×1
GLOVE SS BIOGEL STRL SZ 8.5 (GLOVE) ×1 IMPLANT
GLOVE SUPERSENSE BIOGEL SZ 8.5 (GLOVE) ×1
GOWN STRL REUS W/ TWL LRG LVL3 (GOWN DISPOSABLE) ×2 IMPLANT
GOWN STRL REUS W/ TWL XL LVL3 (GOWN DISPOSABLE) ×1 IMPLANT
GOWN STRL REUS W/TWL 2XL LVL3 (GOWN DISPOSABLE) ×2 IMPLANT
GOWN STRL REUS W/TWL LRG LVL3 (GOWN DISPOSABLE) ×2
GOWN STRL REUS W/TWL XL LVL3 (GOWN DISPOSABLE) ×1
GUIDE PIN SPINAL (PIN) ×2
KIT BASIN OR (CUSTOM PROCEDURE TRAY) ×2 IMPLANT
KIT DILATOR XLIF 5 (KITS) ×2 IMPLANT
KIT SURGICAL ACCESS MAXCESS 4 (KITS) ×2 IMPLANT
KIT TURNOVER KIT B (KITS) ×2 IMPLANT
MODULE EMG NEEDLE SSEP NVM5 (NEEDLE) ×2 IMPLANT
MODULE NVM5 NEXT GEN EMG (NEEDLE) ×2 IMPLANT
NEEDLE 22X1 1/2 (OR ONLY) (NEEDLE) ×2 IMPLANT
NEEDLE SPNL 18GX3.5 QUINCKE PK (NEEDLE) ×2 IMPLANT
NS IRRIG 1000ML POUR BTL (IV SOLUTION) ×2 IMPLANT
PACK LAMINECTOMY ORTHO (CUSTOM PROCEDURE TRAY) ×2 IMPLANT
PACK UNIVERSAL I (CUSTOM PROCEDURE TRAY) ×2 IMPLANT
PAD ARMBOARD 7.5X6 YLW CONV (MISCELLANEOUS) ×4 IMPLANT
PIN GUIDE SPINAL (PIN) ×1 IMPLANT
PLATE DECADE XLIP 2H SZ12 (Plate) ×2 IMPLANT
PUTTY BONE BIOACTIVE 5CC (Putty) ×2 IMPLANT
SCREW DECADE 5.5X50 (Screw) ×2 IMPLANT
SPONGE LAP 4X18 RFD (DISPOSABLE) ×2 IMPLANT
SPONGE SURGIFOAM ABS GEL 100 (HEMOSTASIS) ×2 IMPLANT
STAPLER VISISTAT 35W (STAPLE) ×2 IMPLANT
SUT BONE WAX W31G (SUTURE) IMPLANT
SUT MON AB 3-0 SH 27 (SUTURE) ×2
SUT MON AB 3-0 SH27 (SUTURE) ×2 IMPLANT
SUT VIC AB 1 CT1 18XCR BRD 8 (SUTURE) ×2 IMPLANT
SUT VIC AB 1 CT1 8-18 (SUTURE) ×2
SUT VIC AB 2-0 CT1 18 (SUTURE) ×4 IMPLANT
SUT VIC AB 2-0 CT1 27 (SUTURE) ×1
SUT VIC AB 2-0 CT1 TAPERPNT 27 (SUTURE) ×1 IMPLANT
SYR BULB IRRIGATION 50ML (SYRINGE) ×2 IMPLANT
TAPE CLOTH 4X10 WHT NS (GAUZE/BANDAGES/DRESSINGS) ×2 IMPLANT
TOWEL GREEN STERILE (TOWEL DISPOSABLE) ×2 IMPLANT
TOWEL GREEN STERILE FF (TOWEL DISPOSABLE) ×2 IMPLANT
TRAY FOLEY MTR SLVR 16FR STAT (SET/KITS/TRAYS/PACK) ×2 IMPLANT
WATER STERILE IRR 1000ML POUR (IV SOLUTION) ×2 IMPLANT

## 2018-12-16 NOTE — Anesthesia Postprocedure Evaluation (Signed)
Anesthesia Post Note  Patient: Theresa Holmes  Procedure(s) Performed: ANTERIOR LATERAL LUMBAR FUSION L 3-4 with lateral plate (N/A Back)     Patient location during evaluation: PACU Anesthesia Type: General Level of consciousness: awake and alert and oriented Pain management: pain level controlled Vital Signs Assessment: post-procedure vital signs reviewed and stable Respiratory status: spontaneous breathing, nonlabored ventilation, respiratory function stable and patient connected to nasal cannula oxygen Cardiovascular status: blood pressure returned to baseline and stable Postop Assessment: no apparent nausea or vomiting Anesthetic complications: no    Last Vitals:  Vitals:   12/16/18 1525 12/16/18 1549  BP: (!) 154/69 (!) 174/96  Pulse: 67 67  Resp: 20 16  Temp: 36.4 C (!) 36.3 C  SpO2: 100% 93%    Last Pain:  Vitals:   12/16/18 1549  TempSrc: Oral  PainSc:                  Marjarie Irion A.

## 2018-12-16 NOTE — Anesthesia Preprocedure Evaluation (Addendum)
Anesthesia Evaluation  Patient identified by MRN, date of birth, ID band Patient awake    Reviewed: Allergy & Precautions, NPO status , Patient's Chart, lab work & pertinent test results  Airway Mallampati: II  TM Distance: >3 FB Neck ROM: Full    Dental no notable dental hx. (+) Teeth Intact   Pulmonary neg pulmonary ROS,    Pulmonary exam normal breath sounds clear to auscultation       Cardiovascular hypertension, Normal cardiovascular exam Rhythm:Regular Rate:Normal     Neuro/Psych  Headaches, negative psych ROS   GI/Hepatic Neg liver ROS, hiatal hernia, GERD  Medicated and Controlled,  Endo/Other  Hypothyroidism Hypercholesterolemia  Renal/GU negative Renal ROS  negative genitourinary   Musculoskeletal  (+) Arthritis , Osteoarthritis,  L3-4 lumbar stenosis with radiculopathy   Abdominal (+) + obese,   Peds  Hematology negative hematology ROS (+)   Anesthesia Other Findings   Reproductive/Obstetrics                             Anesthesia Physical Anesthesia Plan  ASA: III  Anesthesia Plan: General   Post-op Pain Management:    Induction: Intravenous  PONV Risk Score and Plan: 4 or greater and Ondansetron, Treatment may vary due to age or medical condition and Dexamethasone  Airway Management Planned: Oral ETT  Additional Equipment:   Intra-op Plan:   Post-operative Plan: Extubation in OR  Informed Consent: I have reviewed the patients History and Physical, chart, labs and discussed the procedure including the risks, benefits and alternatives for the proposed anesthesia with the patient or authorized representative who has indicated his/her understanding and acceptance.     Dental advisory given  Plan Discussed with: CRNA and Surgeon  Anesthesia Plan Comments:         Anesthesia Quick Evaluation

## 2018-12-16 NOTE — Transfer of Care (Signed)
Immediate Anesthesia Transfer of Care Note  Patient: Theresa Holmes  Procedure(s) Performed: ANTERIOR LATERAL LUMBAR FUSION L 3-4 with lateral plate (N/A Back)  Patient Location: PACU  Anesthesia Type:General  Level of Consciousness: awake, alert , oriented and sedated  Airway & Oxygen Therapy: Patient Spontanous Breathing and Patient connected to nasal cannula oxygen  Post-op Assessment: Report given to RN, Post -op Vital signs reviewed and stable and Patient moving all extremities  Post vital signs: Reviewed and stable  Last Vitals:  Vitals Value Taken Time  BP 147/71 12/16/2018  2:39 PM  Temp    Pulse 71 12/16/2018  2:45 PM  Resp 15 12/16/2018  2:45 PM  SpO2 97 % 12/16/2018  2:45 PM  Vitals shown include unvalidated device data.  Last Pain:  Vitals:   12/16/18 0851  TempSrc:   PainSc: 0-No pain      Patients Stated Pain Goal: 0 (59/97/74 1423)  Complications: No apparent anesthesia complications

## 2018-12-16 NOTE — Anesthesia Procedure Notes (Signed)
Procedure Name: Intubation Date/Time: 12/16/2018 11:57 AM Performed by: Scheryl Darter, CRNA Pre-anesthesia Checklist: Patient identified, Emergency Drugs available, Suction available, Patient being monitored and Timeout performed Patient Re-evaluated:Patient Re-evaluated prior to induction Oxygen Delivery Method: Circle system utilized Preoxygenation: Pre-oxygenation with 100% oxygen Induction Type: IV induction Ventilation: Mask ventilation without difficulty Laryngoscope Size: Mac and 4 Grade View: Grade II Tube type: Oral Placement Confirmation: ETT inserted through vocal cords under direct vision Secured at: 21 cm Tube secured with: Tape Dental Injury: Teeth and Oropharynx as per pre-operative assessment

## 2018-12-16 NOTE — H&P (Signed)
Addendum: No change in the patient's clinical exam.  She continues to have significant back buttock and right neuropathic leg pain.  Plan on moving forward with a lateral interbody fusion L3-4 to address the recurrent spinal stenosis as well as retrolisthesis.  This will be supplemented with a lateral plate.  If we are unable to place the plate due to the prior posterior pedicle screw fixation we may need to supplement the lateral interbody fusion device with posterior pedicle screw fixation.  All of this was explained to the patient and her questions were addressed.  We again reviewed the risks and benefits.

## 2018-12-16 NOTE — Op Note (Signed)
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  Application of lateral plate with screw fixation.  Implants: NuVasive 3D) titanium cage -12 x 18 x 50.  10 degree lordotic.  Lateral plate: 12 mm height.  5.5 x 50 mm screws x2.  Intraoperative neuro monitoring: No abnormal free running EMGs, normal SSEPs at the conclusion of the case.  Allograft: Nanofuse and osteocell  Indications: Theresa Holmes is a very pleasant 78 year old woman who presented with severe debilitating back buttock and radicular leg pain.  She has had chronic back pain however recently she started having severe radicular leg pain which is significantly impaired her quality of life.  Despite injection therapy and longstanding self-directed exercises she can is continued to deteriorate.  As result we elected to move forward with surgery.  All appropriate risks benefits and alternatives were discussed with the patient and consent was obtained.    Operative report: Patient is brought the operating room placed upon the operating room table.  After successful induction of general anesthesia and endotracheally patient teds SCDs and Foley were inserted.  The neuro monitoring representative then applied all the appropriate intraoperative needles for EMG and SSEP monitoring.  Patient was then placed supine on the operating room table.  She was then adjusted into the lateral decubitus incision left side up.  The knee and ankle were padded and axillary roll was positioned in the left arm was placed into the elevated armboard.  All bony prominences well-padded and the patient was secured directly to the table with a significant amount of tape.  Imaging studies confirm satisfactory positioning of the back so that I clear visualization of the L5 3 4 level.  Once the patient was secured to  the table and properly positioned the left lateral flank area was prepped and draped in a standard fashion.  Timeout was taken to confirm patient procedure and all other important data.  Once this was confirmed x-ray was used to identify the anterior and posterior margins of the L3-4 disc space.  A transverse incision was made sharp dissection was carried out down to the deep fascia.  A second smaller incision was made 1 fingerbreadth posteriorly and I bluntly dissected down to the posterior aspect of the retroperitoneal space.  I then entered into the retroperitoneal space and bluntly dissected.  I was able to palpate the surface of the psoas muscle.  I then dissected through the undersurface of the external oblique until I could see my finger through the first incision.  I then placed the first dilating tube on top of my finger and brought it down to the surface of the psoas.  I stimulated circumferentially to ensure that I had not to traumatize the lumbar plexus and then advanced through the psoas muscle to the lateral aspect of the L3-4 disc space.  I anchored this into the disc space and then sequentially dilated and stimulated circumferentially.  Again confirming I was not traumatizing the lumbar plexus.  Once I had the final working trocar in I then migrated posteriorly making sure to keep a adequate cuff of muscle between my retractor and the plexus.  Once my retractor was far enough posteriorly I stimulated behind it to ensure I had not traumatize the lumbar plexus.  I then secured the retractor into the disc space with the trocar.  With the device secured I then completely expose the lateral aspect the  disc space.  I confirmed satisfactory position of the tractor in both planes.  An annulotomy was performed with a 10 blade scalpel and using pituitary rongeurs Cobb elevators and a box osteotome I removed all of the disc material.  I scraped the endplates so I had bleeding subchondral bone.  Once I  confirmed satisfactory discectomy I then began trialing.  I started with the 8 parallel and proceeded up to the 12 lordotic.  The 12 lordotic trial provided excellent posterior indirect foraminal decompression and reduce the retrolisthesis.  The indirect foraminal decompression was satisfactory.  I then obtained the actual implant and then placed 5 cc and then infused in 1 cc of ostia cell.  It was then [facility] to the appropriate depth.  I confirmed satisfactory position in both planes.  The lateral x-ray now demonstrated parallel endplate distraction and improved foraminal decompression.  The retrolisthesis had been reduced in the posterior aspect of the vertebral body was now separated indicating adequate indirect foraminal decompression.  I then placed a lateral plate onto the spine and was able to advance the L force screw below the pedicle screw that was in position.  With this in place I then could place the superior screw.  I had excellent positioning of the lateral plate providing adequate stabilization of the lateral placed cage.  This point I placed a millimeter screws and I did get cortical purchase.  Although it was somewhat a little bit long on the contralateral side I wanted this in order to improve the overall fixation.  Both screws had excellent purchase.  The 2 locking screws in the plate were torqued according manufacture standards.  I then remove the trocar irrigated the wound copiously normal saline and made sure hemostasis using bipolar cautery and FloSeal.  The second posterior small wound was also irrigated.  Both wounds were then closed in a layered fashion with interrupted #1 Vicryl suture, 2-0 Vicryl suture, 3-0 Monocryl.  Steri-Strips dry dressings were applied and the patient was ultimately extubated transfer the PACU without incident.  The end of the case all needle sponge counts were correct.  There were no adverse intraoperative events.

## 2018-12-16 NOTE — Brief Op Note (Signed)
12/16/2018  2:45 PM  PATIENT:  Theresa Holmes  78 y.o. female  PRE-OPERATIVE DIAGNOSIS:  L 3-4 stenosis and radiculopathy  POST-OPERATIVE DIAGNOSIS:  L 3-4 stenosis and radiculopathy  PROCEDURE:  Procedure(s) with comments: ANTERIOR LATERAL LUMBAR FUSION L 3-4 with lateral plate (N/A) - 3 1/2 hours  SURGEON:  Surgeon(s) and Role:    Melina Schools, MD - Primary  PHYSICIAN ASSISTANT:   ASSISTANTS: none   ANESTHESIA:   general  EBL:  minimal   BLOOD ADMINISTERED:none  DRAINS: none   LOCAL MEDICATIONS USED:  MARCAINE     SPECIMEN:  No Specimen  DISPOSITION OF SPECIMEN:  N/A  COUNTS:  YES  TOURNIQUET:  * No tourniquets in log *  DICTATION: .Dragon Dictation  PLAN OF CARE: Admit to inpatient   PATIENT DISPOSITION:  PACU - hemodynamically stable.

## 2018-12-16 NOTE — Progress Notes (Signed)
Orthopedic Tech Progress Note Patient Details:  RUWAYDA CURET 12/26/1940 [id number] Called down to PACU RN said patient's family has her brace.  Patient ID: Renato Battles, female   DOB: 12-31-1940, 78 y.o.   MRN: [id number]   Janit Pagan 12/16/2018, 3:10 PM

## 2018-12-17 ENCOUNTER — Encounter (HOSPITAL_COMMUNITY): Payer: Self-pay | Admitting: Orthopedic Surgery

## 2018-12-17 MED ORDER — PROMETHAZINE HCL 25 MG RE SUPP
25.0000 mg | Freq: Once | RECTAL | Status: AC
  Administered 2018-12-17: 25 mg via RECTAL
  Filled 2018-12-17: qty 1

## 2018-12-17 MED ORDER — HYDROMORPHONE HCL 1 MG/ML IJ SOLN
1.0000 mg | INTRAMUSCULAR | Status: DC | PRN
  Administered 2018-12-17 – 2018-12-18 (×3): 1 mg via INTRAMUSCULAR
  Filled 2018-12-17 (×3): qty 1

## 2018-12-17 MED ORDER — MAGNESIUM CITRATE PO SOLN
1.0000 | Freq: Once | ORAL | Status: AC
  Administered 2018-12-17: 1 via ORAL
  Filled 2018-12-17: qty 296

## 2018-12-17 MED ORDER — HYDROMORPHONE HCL 2 MG PO TABS: 1.0000 mg | ORAL_TABLET | ORAL | Status: DC | PRN

## 2018-12-17 NOTE — Progress Notes (Signed)
    Subjective: Procedure(s) (LRB): ANTERIOR LATERAL LUMBAR FUSION L 3-4 with lateral plate (N/A) 1 Day Post-Op  Patient reports pain as 2 on 0-10 scale.  Reports decreased leg pain reports incisional back pain   Positive void Negative bowel movement Positive flatus Negative chest pain or shortness of breath  Objective: Vital signs in last 24 hours: Temp:  [97.4 F (36.3 C)-98.3 F (36.8 C)] 98.3 F (36.8 C) (01/23 1200) Pulse Rate:  [63-77] 77 (01/23 1200) Resp:  [14-20] 18 (01/23 1200) BP: (139-174)/(69-96) 156/84 (01/23 1200) SpO2:  [87 %-100 %] 96 % (01/23 1200)  Intake/Output from previous day: 01/22 0701 - 01/23 0700 In: 1753.1 [I.V.:1253.1; IV Piggyback:400] Out: 1050 [Urine:1025; Blood:25]  Labs: No results for input(s): WBC, RBC, HCT, PLT in the last 72 hours. No results for input(s): NA, K, CL, CO2, BUN, CREATININE, GLUCOSE, CALCIUM in the last 72 hours. No results for input(s): LABPT, INR in the last 72 hours.  Physical Exam: Neurologically intact Neurovascular intact Intact pulses distally Dorsiflexion/Plantar flexion intact Incision: dressing C/D/I Compartment soft Body mass index is 32.39 kg/m.   Positive nausea and emesis. No abdominal swelling, or rebound tenderness.  No shortness of breath or chest pain.  Patient's primary complaint is incisional back pain, left lateral and anterior groin pain.    Assessment/Plan: Patient stable  xrays n/a Continue mobilization with physical therapy Continue care  1.  Patient's significant complaint of left groin pain is to be expected given the trans-psoas surgical approach.  I have reassured the patient concerning this.  She is neurologically intact there is no evidence of motor deficits. 2.  Patient is also having significant nausea which we have aggressively been treating with medications.  At this point time it is beginning to settle down we will continue to monitor it. 3.  I am getting give her IM  Dilaudid 2 mg every 2x4 doses for pain control.  My hope is that the IM injection will help with her pain without aggravating her GI symptoms. 4.  Continue to encourage mobilization as she is able to.  Melina Schools, MD [facility] [phone]

## 2018-12-17 NOTE — Evaluation (Signed)
Physical Therapy Evaluation Patient Details Name: Theresa Holmes MRN: [id number] DOB: 1941/10/18 Today's Date: 12/17/2018   History of Present Illness  Pt is a 77 y/o female who presents s/p L3-L4 ALIF on 12/16/2018. PMH significant for shingles, HTN, history of hiatal hernia s/p repair, rotator cuff repair, carpal tunnel release R, L4-L5 fusion 2005.  Clinical Impression  Pt admitted with above diagnosis. Pt currently with functional limitations due to the deficits listed below (see PT Problem List). At the time of PT eval pt was able to perform transfers and ambulation with gross min guard to min assist with RW for support. Session limited by vomiting during gait training. Pt and son were educated on precautions, brace application/wearing schedule, positioning recommendations, and activity progression. Pt will benefit from skilled PT to increase their independence and safety with mobility to allow discharge to the venue listed below.     Follow Up Recommendations No PT follow up;Supervision for mobility/OOB    Equipment Recommendations  Rolling walker with 5" wheels(Youth)    Recommendations for Other Services       Precautions / Restrictions Precautions Precautions: Fall;Back Precaution Booklet Issued: Yes (comment) Precaution Comments: Reviewed with pt and son. Pt was cued for precautions during functional mobility.  Required Braces or Orthoses: Spinal Brace Spinal Brace: Lumbar corset;Applied in sitting position Restrictions Weight Bearing Restrictions: No      Mobility  Bed Mobility Overal bed mobility: Needs Assistance Bed Mobility: Rolling;Sidelying to Sit Rolling: Min guard Sidelying to sit: Min guard       General bed mobility comments: Close guard for safety as pt transitioned to EOB. HOB elevated and use of rails required. VC's for proper log roll technique.   Transfers Overall transfer level: Needs assistance Equipment used: Rolling walker (2  wheeled) Transfers: Sit to/from Stand Sit to Stand: Min assist         General transfer comment: Steadying assist as pt powered up to full stand. VC's for hand placement on seated surface for safety.   Ambulation/Gait Ambulation/Gait assistance: Min guard;Min assist Gait Distance (Feet): 50 Feet Assistive device: Rolling walker (2 wheeled) Gait Pattern/deviations: Step-through pattern;Decreased stride length;Trunk flexed Gait velocity: Decreased Gait velocity interpretation: 1.31 - 2.62 ft/sec, indicative of limited community ambulator General Gait Details: VC's for improved posture and general safety with RW. Pt ambulated ~50 feet before vomiting. Assist provided for balance support during vomiting episode.   Stairs            Wheelchair Mobility    Modified Rankin (Stroke Patients Only)       Balance Overall balance assessment: Needs assistance Sitting-balance support: Feet supported;No upper extremity supported Sitting balance-Leahy Scale: Fair     Standing balance support: No upper extremity supported;During functional activity Standing balance-Leahy Scale: Poor Standing balance comment: Reliant on RW for support                             Pertinent Vitals/Pain Pain Assessment: Faces Faces Pain Scale: Hurts little more Pain Location: Incision site on L Pain Descriptors / Indicators: Operative site guarding;Discomfort;Grimacing Pain Intervention(s): Limited activity within patient's tolerance;Monitored during session;Repositioned    Home Living Family/patient expects to be discharged to:: Private residence Living Arrangements: Spouse/significant other Available Help at Discharge: Family;Available 24 hours/day Type of Home: House Home Access: Stairs to enter Entrance Stairs-Rails: None Entrance Stairs-Number of Steps: 3 Home Layout: One level Home Equipment: Walker - 2 wheels;Shower seat;Toilet riser Additional Comments: Pt's  son to bring in  walker for measure - may need a youth size    Prior Function Level of Independence: Independent               Hand Dominance        Extremity/Trunk Assessment   Upper Extremity Assessment Upper Extremity Assessment: Defer to OT evaluation    Lower Extremity Assessment Lower Extremity Assessment: Generalized weakness(Consistent with pre-op diagnosis)    Cervical / Trunk Assessment Cervical / Trunk Assessment: Other exceptions Cervical / Trunk Exceptions: s/p surgery  Communication   Communication: No difficulties  Cognition Arousal/Alertness: Awake/alert Behavior During Therapy: WFL for tasks assessed/performed Overall Cognitive Status: Within Functional Limits for tasks assessed                                        General Comments      Exercises     Assessment/Plan    PT Assessment Patient needs continued PT services  PT Problem List Decreased strength;Decreased activity tolerance;Decreased balance;Decreased mobility;Decreased knowledge of use of DME;Decreased knowledge of precautions;Decreased safety awareness;Pain       PT Treatment Interventions DME instruction;Gait training;Stair training;Functional mobility training;Therapeutic activities;Therapeutic exercise;Neuromuscular re-education;Patient/family education    PT Goals (Current goals can be found in the Care Plan section)  Acute Rehab PT Goals Patient Stated Goal: Feel better/stop vomiting and go home. PT Goal Formulation: With patient/family Time For Goal Achievement: 12/24/18 Potential to Achieve Goals: Good    Frequency Min 5X/week   Barriers to discharge        Co-evaluation               AM-PAC PT "6 Clicks" Mobility  Outcome Measure Help needed turning from your back to your side while in a flat bed without using bedrails?: A Little Help needed moving from lying on your back to sitting on the side of a flat bed without using bedrails?: A Little Help needed  moving to and from a bed to a chair (including a wheelchair)?: A Little Help needed standing up from a chair using your arms (e.g., wheelchair or bedside chair)?: A Little Help needed to walk in hospital room?: A Little Help needed climbing 3-5 steps with a railing? : A Lot 6 Click Score: 17    End of Session Equipment Utilized During Treatment: Gait belt;Back brace Activity Tolerance: Treatment limited secondary to medical complications (Comment)(Vomiting) Patient left: in chair;with call bell/phone within reach;with family/visitor present Nurse Communication: Mobility status PT Visit Diagnosis: Unsteadiness on feet (R26.81);Pain;Difficulty in walking, not elsewhere classified (R26.2) Pain - Right/Left: Left Pain - part of body: (abdomen - incision site)    Time: 9833-8250 PT Time Calculation (min) (ACUTE ONLY): 30 min   Charges:   PT Evaluation $PT Eval Moderate Complexity: 1 Mod PT Treatments $Gait Training: 8-22 mins        Rolinda Roan, PT, DPT Acute Rehabilitation Services Pager: [phone] Office: [phone]   Thelma Comp 12/17/2018, 11:40 AM

## 2018-12-17 NOTE — Evaluation (Signed)
Occupational Therapy Evaluation Patient Details Name: Theresa Holmes MRN: [id number] DOB: 1941-06-30 Today's Date: 12/17/2018    History of Present Illness Pt is a 78 y/o female who presents s/p L3-L4 ALIF on 12/16/2018. PMH significant for shingles, HTN, history of hiatal hernia s/p repair, rotator cuff repair, carpal tunnel release R, L4-L5 fusion 2005.   Clinical Impression   Pt with hx of back surgery, was functioning independently prior to admission. Presents with nausea and impaired standing balance. Pt requires max assist for LB bathing and dressing, began education in benefits of AE, pt is interested in practicing use next session. Will follow.    Follow Up Recommendations  No OT follow up    Equipment Recommendations  None recommended by OT    Recommendations for Other Services       Precautions / Restrictions Precautions Precautions: Fall;Back Precaution Booklet Issued: Yes (comment) Precaution Comments: reviewed back precautions related to ADL and IADL Required Braces or Orthoses: Spinal Brace Spinal Brace: Lumbar corset;Applied in sitting position Restrictions Weight Bearing Restrictions: No      Mobility Bed Mobility               General bed mobility comments: pt received in chair  Transfers Overall transfer level: Needs assistance Equipment used: Rolling walker (2 wheeled) Transfers: Sit to/from Stand Sit to Stand: Min assist         General transfer comment: steadying assist, cues for hand placement, increased time    Balance Overall balance assessment: Needs assistance   Sitting balance-Leahy Scale: Fair       Standing balance-Leahy Scale: Poor Standing balance comment: Reliant on RW for support                           ADL either performed or assessed with clinical judgement   ADL Overall ADL's : Needs assistance/impaired Eating/Feeding: Independent;Sitting   Grooming: Wash/dry hands;Standing;Min guard   Upper Body  Bathing: Set up;Sitting Upper Body Bathing Details (indicate cue type and reason): recommended long handled bath sponge for back Lower Body Bathing: Moderate assistance;Sit to/from stand Lower Body Bathing Details (indicate cue type and reason): recommended long handled bath sponge Upper Body Dressing : Set up;Sitting   Lower Body Dressing: Moderate assistance;Sit to/from stand Lower Body Dressing Details (indicate cue type and reason): pt unable to cross foot over opposite knee, interested in education in use of AE Toilet Transfer: Min guard;Ambulation;RW   Toileting- Clothing Manipulation and Hygiene: Minimal assistance;Sit to/from stand Toileting - Clothing Manipulation Details (indicate cue type and reason): cues to avoid twisting with pericare, educated in availability of toilet tongs     Functional mobility during ADLs: Min guard;Rolling walker General ADL Comments: limited by ongoing nausea     Vision Baseline Vision/History: Wears glasses Patient Visual Report: No change from baseline       Perception     Praxis      Pertinent Vitals/Pain Pain Assessment: Faces Faces Pain Scale: Hurts little more Pain Location: Incision site on L Pain Descriptors / Indicators: Operative site guarding;Discomfort;Grimacing Pain Intervention(s): Monitored during session     Hand Dominance Right   Extremity/Trunk Assessment Upper Extremity Assessment Upper Extremity Assessment: Longstanding L shoulder limitations   Lower Extremity Assessment Lower Extremity Assessment: Defer to PT evaluation       Communication Communication Communication: No difficulties   Cognition Arousal/Alertness: Awake/alert Behavior During Therapy: WFL for tasks assessed/performed Overall Cognitive Status: Within Functional Limits for tasks assessed  General Comments       Exercises     Shoulder Instructions      Home Living Family/patient  expects to be discharged to:: Private residence Living Arrangements: Spouse/significant other Available Help at Discharge: Family;Available 24 hours/day Type of Home: House Home Access: Stairs to enter [employer] of Steps: 3 Entrance Stairs-Rails: None Home Layout: One level     Bathroom Shower/Tub: Occupational psychologist: Standard     Home Equipment: Environmental consultant - 2 wheels;Shower seat;Toilet riser   Additional Comments: pt's RW is in room, adjusted to lowest level      Prior Functioning/Environment Level of Independence: Independent                 OT Problem List: Impaired balance (sitting and/or standing);Decreased knowledge of use of DME or AE;Pain;Decreased knowledge of precautions      OT Treatment/Interventions: Self-care/ADL training;DME and/or AE instruction;Patient/family education;Balance training    OT Goals(Current goals can be found in the care plan section) Acute Rehab OT Goals Patient Stated Goal: return home, be able to eat OT Goal Formulation: With patient Time For Goal Achievement: 12/31/18 Potential to Achieve Goals: Good ADL Goals Pt Will Perform Lower Body Bathing: with adaptive equipment;sit to/from stand;with supervision Pt Will Perform Lower Body Dressing: with supervision;with adaptive equipment;sit to/from stand Pt Will Transfer to Toilet: with supervision;ambulating Pt Will Perform Toileting - Clothing Manipulation and hygiene: with supervision;sit to/from stand Pt Will Perform Tub/Shower Transfer: Shower transfer;with supervision;shower seat;rolling walker;ambulating Additional ADL Goal #1: Pt will adhere to back precautions during ADL and mobility independently.  OT Frequency: Min 2X/week   Barriers to D/C:            Co-evaluation              AM-PAC OT "6 Clicks" Daily Activity     Outcome Measure Help from another person eating meals?: None Help from another person taking care of personal grooming?: A  Little Help from another person toileting, which includes using toliet, bedpan, or urinal?: A Little Help from another person bathing (including washing, rinsing, drying)?: A Lot Help from another person to put on and taking off regular upper body clothing?: None Help from another person to put on and taking off regular lower body clothing?: A Lot 6 Click Score: 18   End of Session Equipment Utilized During Treatment: Gait belt;Rolling walker;Back brace  Activity Tolerance: Treatment limited secondary to medical complications (Comment)(nausea) Patient left: in chair;with call bell/phone within reach;with family/visitor present  OT Visit Diagnosis: Unsteadiness on feet (R26.81);Other abnormalities of gait and mobility (R26.89);Pain                Time: 1657-9038 OT Time Calculation (min): 22 min Charges:  OT General Charges $OT Visit: 1 Visit OT Evaluation $OT Eval Moderate Complexity: 1 Mod  Nestor Lewandowsky, OTR/L Acute Rehabilitation Services Pager: [phone] Office: [phone]  Malka So 12/17/2018, 4:25 PM

## 2018-12-18 ENCOUNTER — Encounter

## 2018-12-18 ENCOUNTER — Ambulatory Visit: Payer: BLUE CROSS/BLUE SHIELD | Admitting: Obstetrics & Gynecology

## 2018-12-18 MED ORDER — ONDANSETRON HCL 4 MG PO TABS: 4.0000 mg | ORAL_TABLET | Freq: Three times a day (TID) | ORAL | 0 refills | Status: AC | PRN

## 2018-12-18 MED ORDER — OXYCODONE-ACETAMINOPHEN 10-325 MG PO TABS: 1.0000 | ORAL_TABLET | Freq: Four times a day (QID) | ORAL | 0 refills | Status: AC | PRN

## 2018-12-18 MED ORDER — METHOCARBAMOL 500 MG PO TABS: 500.0000 mg | ORAL_TABLET | Freq: Three times a day (TID) | ORAL | 0 refills | Status: AC | PRN

## 2018-12-18 NOTE — Progress Notes (Signed)
Physical Therapy Narrative for Hospital Bed  Patient suffers from decreased strength and tolerance for functional mobility s/p back surgery which impairs their ability to enter her bed at home and appropriately position herself on a non-adjustable bed. A hospital bed will allow patient to safely enter/exit bed and adjust her position.  Rolinda Roan, PT, DPT Acute Rehabilitation Services Pager: [phone] Office: [phone]

## 2018-12-18 NOTE — Progress Notes (Signed)
Patient is discharged from room 3C02 at this time. Alert and in stable condition. IV site d/c'd and instructions read to patient and spouse with understanding verbalized. Left unit via wheelchair with all belongings at side 

## 2018-12-18 NOTE — Progress Notes (Signed)
Occupational Therapy Treatment Patient Details Name: Theresa Holmes MRN: [id number] DOB: Aug 14, 1941 Today's Date: 12/18/2018    History of present illness Pt is a 78 y/o female who presents s/p L3-L4 ALIF on 12/16/2018. PMH significant for shingles, HTN, history of hiatal hernia s/p repair, rotator cuff repair, carpal tunnel release R, L4-L5 fusion 2005.   OT comments  Focus of session on reinforcing back precautions during ADL and IADL and education in use of AE for LB bathing and dressing. Pt's husband participating in session. Pt with no further questions. Plans to d/c home today.  Follow Up Recommendations  No OT follow up    Equipment Recommendations  Hospital bed    Recommendations for Other Services      Precautions / Restrictions Precautions Precautions: Fall;Back Precaution Comments: continued education in back precautions, implementing in daily living activities Required Braces or Orthoses: Spinal Brace Spinal Brace: Lumbar corset;Applied in sitting position Restrictions Weight Bearing Restrictions: No       Mobility Bed Mobility Overal bed mobility: Needs Assistance Bed Mobility: Rolling;Sidelying to Sit Rolling: Modified independent (Device/Increase time) Sidelying to sit: Min assist       General bed mobility comments: heavy reliance on rail to roll, min assist to raise trunk  Transfers Overall transfer level: Needs assistance   Transfers: Sit to/from Stand Sit to Stand: Supervision         General transfer comment: for safety    Balance Overall balance assessment: Needs assistance   Sitting balance-Leahy Scale: Good       Standing balance-Leahy Scale: Fair Standing balance comment: statically                           ADL either performed or assessed with clinical judgement   ADL Overall ADL's : Needs assistance/impaired             Lower Body Bathing: Supervison/ safety;Sitting/lateral leans;Sit to/from stand Lower Body  Bathing Details (indicate cue type and reason): educated in use of long handled sponge for back and feet and reacher to dry feet     Lower Body Dressing: Supervision/safety;Sit to/from stand Lower Body Dressing Details (indicate cue type and reason): educated in use of reacher to doff socks and start pants over feet, instructed in use of sock aid and long handled shoe horn                     Vision       Perception     Praxis      Cognition Arousal/Alertness: Awake/alert Behavior During Therapy: WFL for tasks assessed/performed Overall Cognitive Status: Within Functional Limits for tasks assessed                                 General Comments: pt with some difficulty recalling events of yesterday        Exercises     Shoulder Instructions       General Comments      Pertinent Vitals/ Pain       Pain Assessment: Faces Faces Pain Scale: Hurts even more Pain Location: L hip with bed mobility Pain Descriptors / Indicators: Operative site guarding;Discomfort;Grimacing Pain Intervention(s): Monitored during session  Home Living  Prior Functioning/Environment              Frequency           Progress Toward Goals  OT Goals(current goals can now be found in the care plan section)  Progress towards OT goals: Progressing toward goals  Acute Rehab OT Goals Patient Stated Goal: return home OT Goal Formulation: With patient Time For Goal Achievement: 12/31/18 Potential to Achieve Goals: Good  Plan Discharge plan remains appropriate    Co-evaluation                 AM-PAC OT "6 Clicks" Daily Activity     Outcome Measure   Help from another person eating meals?: None Help from another person taking care of personal grooming?: A Little Help from another person toileting, which includes using toliet, bedpan, or urinal?: A Little Help from another person bathing  (including washing, rinsing, drying)?: A Little Help from another person to put on and taking off regular upper body clothing?: None Help from another person to put on and taking off regular lower body clothing?: A Little 6 Click Score: 20    End of Session Equipment Utilized During Treatment: Back brace  OT Visit Diagnosis: Unsteadiness on feet (R26.81);Other abnormalities of gait and mobility (R26.89);Pain   Activity Tolerance Patient tolerated treatment well   Patient Left Other (comment)(with PT)   Nurse Communication          Time: 6468-0321 OT Time Calculation (min): 16 min  Charges: OT General Charges $OT Visit: 1 Visit OT Treatments $Self Care/Home Management : 8-22 mins  Nestor Lewandowsky, OTR/L Acute Rehabilitation Services Pager: [phone] Office: [phone]  Malka So 12/18/2018, 9:36 AM

## 2018-12-18 NOTE — Progress Notes (Signed)
Physical Therapy Treatment Patient Details Name: Theresa Holmes MRN: [id number] DOB: 1941/02/26 Today's Date: 12/18/2018    History of Present Illness Pt is a 78 y/o female who presents s/p L3-L4 ALIF on 12/16/2018. PMH significant for shingles, HTN, history of hiatal hernia s/p repair, rotator cuff repair, carpal tunnel release R, L4-L5 fusion 2005.    PT Comments    Pt progressing well with post-op mobility. No nausea reported throughout session. Pt's husband present and they were both educated on safe stair negotiation at home as pt does not have railings. They were also educated on precautions, brace application/wearing schedule/adjustments, car transfer, positioning recommendations and safe activity progression. Will continue to follow and progress as able per POC.   Follow Up Recommendations  No PT follow up;Supervision for mobility/OOB     Equipment Recommendations  Rolling walker with 5" wheels;Hospital bed(Youth RW)    Recommendations for Other Services       Precautions / Restrictions Precautions Precautions: Fall;Back Precaution Booklet Issued: Yes (comment) Precaution Comments: continued education in back precautions, implementing in daily living activities Required Braces or Orthoses: Spinal Brace Spinal Brace: Lumbar corset;Applied in sitting position Restrictions Weight Bearing Restrictions: No    Mobility  Bed Mobility Overal bed mobility: Needs Assistance Bed Mobility: Rolling;Sidelying to Sit Rolling: Modified independent (Device/Increase time) Sidelying to sit: Min assist       General bed mobility comments: heavy reliance on rail to roll, min assist to raise trunk  Transfers Overall transfer level: Needs assistance Equipment used: Rolling walker (2 wheeled) Transfers: Sit to/from Stand Sit to Stand: Supervision         General transfer comment: for safety  Ambulation/Gait Ambulation/Gait assistance: Min guard;Min assist Gait Distance (Feet):  100 Feet Assistive device: Rolling walker (2 wheeled) Gait Pattern/deviations: Step-through pattern;Decreased stride length;Trunk flexed Gait velocity: Decreased Gait velocity interpretation: 1.31 - 2.62 ft/sec, indicative of limited community ambulator General Gait Details: VC's for improved posture and closer walker proximity. Overall slow but steady.   Stairs Stairs: Yes Stairs assistance: Min assist;+2 safety/equipment Stair Management: No rails;Step to pattern;Forwards Number of Stairs: 2 General stair comments: HHA mainly on right with husband present and therapist guarding on L to simulate home environment. Increased time required for sequencing and negotiating 2 stairs. Pain in L hip from incision limiting however overall pt was able to complete safely with husband's assist.    Wheelchair Mobility    Modified Rankin (Stroke Patients Only)       Balance Overall balance assessment: Needs assistance Sitting-balance support: Feet supported;No upper extremity supported Sitting balance-Leahy Scale: Good     Standing balance support: No upper extremity supported;During functional activity Standing balance-Leahy Scale: Fair Standing balance comment: statically                            Cognition Arousal/Alertness: Awake/alert Behavior During Therapy: WFL for tasks assessed/performed Overall Cognitive Status: Within Functional Limits for tasks assessed                                 General Comments: pt with some difficulty recalling events of yesterday      Exercises      General Comments        Pertinent Vitals/Pain Pain Assessment: Faces Faces Pain Scale: Hurts even more Pain Location: L hip with bed mobility Pain Descriptors / Indicators: Operative site guarding;Discomfort;Grimacing Pain Intervention(s): Monitored  during session    [place] expects to be discharged to:: Private residence Living Arrangements:  Spouse/significant other Available Help at Discharge: Family;Available 24 hours/day Type of Home: House Home Access: Stairs to enter Entrance Stairs-Rails: None Home Layout: One level Home Equipment: Environmental consultant - 2 wheels;Shower seat;Toilet riser Additional Comments: pt's RW is in room, adjusted to lowest level    Prior Function Level of Independence: Independent          PT Goals (current goals can now be found in the care plan section) Acute Rehab PT Goals Patient Stated Goal: return home PT Goal Formulation: With patient/family Time For Goal Achievement: 12/24/18 Potential to Achieve Goals: Good Progress towards PT goals: Progressing toward goals    Frequency    Min 5X/week      PT Plan Current plan remains appropriate    Co-evaluation              AM-PAC PT "6 Clicks" Mobility   Outcome Measure  Help needed turning from your back to your side while in a flat bed without using bedrails?: A Little Help needed moving from lying on your back to sitting on the side of a flat bed without using bedrails?: A Little Help needed moving to and from a bed to a chair (including a wheelchair)?: A Little Help needed standing up from a chair using your arms (e.g., wheelchair or bedside chair)?: A Little Help needed to walk in hospital room?: A Little Help needed climbing 3-5 steps with a railing? : A Lot 6 Click Score: 17    End of Session Equipment Utilized During Treatment: Gait belt;Back brace Activity Tolerance: Patient tolerated treatment well Patient left: in chair;with call bell/phone within reach;with family/visitor present Nurse Communication: Mobility status PT Visit Diagnosis: Unsteadiness on feet (R26.81);Pain;Difficulty in walking, not elsewhere classified (R26.2) Pain - Right/Left: Left Pain - part of body: (abdomen - incision site)     Time: 2774-1287 PT Time Calculation (min) (ACUTE ONLY): 35 min  Charges:  $Gait Training: 23-37 mins                      Theresa Holmes, PT, DPT Acute Rehabilitation Services Pager: [phone] Office: [phone]    Theresa Holmes 12/18/2018, 10:09 AM

## 2018-12-18 NOTE — Progress Notes (Signed)
    Subjective: Procedure(s) (LRB): ANTERIOR LATERAL LUMBAR FUSION L 3-4 with lateral plate (N/A) 2 Days Post-Op  Patient reports pain as 3 on 0-10 scale.  Reports decreased right leg pain reports incisional back pain   Positive void Negative bowel movement Positive flatus Negative chest pain or shortness of breath  Objective: Vital signs in last 24 hours: Temp:  [98 F (36.7 C)-99.2 F (37.3 C)] 98.8 F (37.1 C) (01/24 1158) Pulse Rate:  [78-104] 79 (01/24 1158) Resp:  [18-19] 18 (01/24 1158) BP: (105-125)/(55-77) 113/55 (01/24 1158) SpO2:  [92 %-98 %] 98 % (01/24 1158)  Intake/Output from previous day: No intake/output data recorded.  Labs: No results for input(s): WBC, RBC, HCT, PLT in the last 72 hours. No results for input(s): NA, K, CL, CO2, BUN, CREATININE, GLUCOSE, CALCIUM in the last 72 hours. No results for input(s): LABPT, INR in the last 72 hours.  Physical Exam: Neurologically intact ABD soft Intact pulses distally Incision: dressing C/D/I Compartment soft Body mass index is 32.39 kg/m.   Assessment/Plan: Patient stable  xrays n/a Continue mobilization with physical therapy Continue care  Patient is doing well overall. Her primary complaint is the left flank pain secondary to the trans-psoas approach.  While it is painful she does note that it is slowly improving.  The preoperative right radicular leg pain has resolved.  At this point time will discharge to home.  Instructions were provided to the patient and her husband and all their questions were addressed.  She will follow-up with me in 2 weeks for reevaluation.  Melina Schools, MD [facility] [phone]

## 2018-12-18 NOTE — Discharge Summary (Signed)
Patient ID: HOKULANI Theresa Holmes MRN: [id number] DOB/AGE: 77-Oct-1942 77 y.o.  Admit date: 12/16/2018 Discharge date: 12/18/2018  Admission Diagnoses:  Active Problems:   Degenerative disc disease, lumbar   Discharge Diagnoses:  Active Problems:   Degenerative disc disease, lumbar  status post Procedure(s): ANTERIOR LATERAL LUMBAR FUSION L 3-4 with lateral plate  Past Medical History:  Diagnosis Date  . Arthritis   . Atypical chest pain 10/22/2016  . Chronic back pain   . Exogenous obesity   . GERD (gastroesophageal reflux disease)   . H/O hiatal hernia   . History of kidney stones   . Hypercholesterolemia   . Hyperlipidemia   . Hypertension    12/14/2018 pt. denies  . Hypothyroidism    HX PARTIAL THYROIDECTOMY FOR  NODULE- BENIGN  . Shingles 11/2012  . Spinal stenosis   . Thyroid mass    right, benign  . Varicose vein     Surgeries: Procedure(s): ANTERIOR LATERAL LUMBAR FUSION L 3-4 with lateral plate on 7/34/1937   Consultants:   Discharged Condition: Improved  Hospital Course: Theresa Holmes is an 78 y.o. female who was admitted 12/16/2018 for operative treatment of adjacent segment degenerative disease L3-4 with radiculopathy right side, and degenerative scoliosis.. Patient failed conservative treatments (please see the history and physical for the specifics) and had severe unremitting pain that affects sleep, daily activities and work/hobbies. After pre-op clearance, the patient was taken to the operating room on 12/16/2018 and underwent  Procedure(s): ANTERIOR LATERAL LUMBAR FUSION L 3-4 with lateral plate.    Patient was given perioperative antibiotics:  Anti-infectives (From admission, onward)   Start     Dose/Rate Route Frequency Ordered Stop   12/16/18 1600  ceFAZolin (ANCEF) IVPB 1 g/50 mL premix     1 g 100 mL/hr over 30 Minutes Intravenous Every 8 hours 12/16/18 1547 12/16/18 2326   12/16/18 0819  ceFAZolin (ANCEF) IVPB 2g/100 mL premix     2 g 200 mL/hr  over 30 Minutes Intravenous 30 min pre-op 12/16/18 9024 12/16/18 1201       Patient was given sequential compression devices and early ambulation to prevent DVT.   Patient benefited maximally from hospital stay and there were no complications. At the time of discharge, the patient was urinating/moving their bowels without difficulty, tolerating a regular diet, pain is controlled with oral pain medications and they have been cleared by PT/OT.   Hospital course has been essentially on eventful.  She is ambulating with walker.  The incisions are healing well there is no signs of infection.  Patient's preoperative neuropathic right leg pain has resolved.  Primary complaint is left flank and pain secondary to the trans-psoas approach.  Patient will be discharged home with appropriate instructions.  Recent vital signs:  Patient Vitals for the past 24 hrs:  BP Temp Temp src Pulse Resp SpO2  12/18/18 1158 (!) 113/55 98.8 F (37.1 C) Oral 79 18 98 %  12/18/18 0724 125/77 98 F (36.7 C) Oral (!) 104 18 98 %  12/18/18 0303 (!) 105/58 98.1 F (36.7 C) Oral 80 18 92 %  12/17/18 2325 (!) 118/57 99.2 F (37.3 C) Oral 83 18 92 %  12/17/18 1918 114/62 98.8 F (37.1 C) Oral 86 18 95 %  12/17/18 1640 121/74 99 F (37.2 C) Oral 78 19 92 %     Recent laboratory studies: No results for input(s): WBC, HGB, HCT, PLT, NA, K, CL, CO2, BUN, CREATININE, GLUCOSE, INR, CALCIUM in the last  72 hours.  Invalid input(s): PT, 2   Discharge Medications:   Allergies as of 12/18/2018   No Known Allergies     Medication List    TAKE these medications   ADVIL PM 200-25 MG Caps Generic drug:  Ibuprofen-diphenhydrAMINE HCl Take 1 tablet by mouth at bedtime as needed (sleep).   calcium carbonate 500 MG chewable tablet Commonly known as:  TUMS - dosed in mg elemental calcium Chew 2 tablets by mouth daily as needed for indigestion or heartburn.   esomeprazole 40 MG capsule Commonly known as:  NEXIUM Take 40 mg  by mouth daily at 12 noon.   levothyroxine 137 MCG tablet Commonly known as:  SYNTHROID, LEVOTHROID Take 137 mcg by mouth daily before breakfast.   methocarbamol 500 MG tablet Commonly known as:  ROBAXIN Take 1 tablet (500 mg total) by mouth every 8 (eight) hours as needed for up to 5 days for muscle spasms.   ondansetron 4 MG tablet Commonly known as:  ZOFRAN Take 1 tablet (4 mg total) by mouth every 8 (eight) hours as needed for up to 30 days for nausea or vomiting.   oxyCODONE-acetaminophen 10-325 MG tablet Commonly known as:  PERCOCET Take 1 tablet by mouth every 6 (six) hours as needed for up to 5 days for pain.   pravastatin 10 MG tablet Commonly known as:  PRAVACHOL Take 1 tablet (10 mg total) by mouth 3 (three) times a week.            Durable Medical Equipment  (From admission, onward)         Start     Ordered   12/18/18 1013  For home use only DME 3 n 1  Once     12/18/18 1014   12/18/18 1013  For home use only DME Walker rolling  Once    Question:  Patient needs a walker to treat with the following condition  Answer:  Gait instability   12/18/18 1014   12/18/18 1007  For home use only DME Hospital bed  Once    Question Answer Comment  Patient has (list medical condition): S/p L3-L4 Fusion   The above medical condition requires: Patient requires the ability to reposition frequently   Head must be elevated greater than: 45 degrees   Bed type Semi-electric      12/18/18 1006          Diagnostic Studies: Dg Lumbar Spine 2-3 Views  Result Date: 12/16/2018 CLINICAL DATA:  Lumbar fusion EXAM: DG C-ARM 61-120 MIN; LUMBAR SPINE - 2-3 VIEW COMPARISON:  None FINDINGS: Three intraoperative views lumbar spine demonstrate lateral fixation of the L3-L4 vertebral bodies. Prior posterior lumbar fixation L4 through S1 with pedicle screws and posterior fusion rods. IMPRESSION: Lateral fusion at L3-L4. Electronically Signed   By: Suzy Bouchard M.D.   On: 12/16/2018  14:32   Dg C-arm 1-60 Min  Result Date: 12/16/2018 CLINICAL DATA:  Lumbar fusion EXAM: DG C-ARM 61-120 MIN; LUMBAR SPINE - 2-3 VIEW COMPARISON:  None FINDINGS: Three intraoperative views lumbar spine demonstrate lateral fixation of the L3-L4 vertebral bodies. Prior posterior lumbar fixation L4 through S1 with pedicle screws and posterior fusion rods. IMPRESSION: Lateral fusion at L3-L4. Electronically Signed   By: Suzy Bouchard M.D.   On: 12/16/2018 14:32    Discharge Instructions    Incentive spirometry RT   Complete by:  As directed         Discharge Plan:  discharge to home  Disposition: Stable  Signed: Dahlia Bailiff for Dr. Melina Schools [facility] [phone] 12/18/2018, 12:32 PM

## 2018-12-19 DIAGNOSIS — M5136 Other intervertebral disc degeneration, lumbar region: Secondary | ICD-10-CM | POA: Diagnosis not present

## 2018-12-22 ENCOUNTER — Encounter (INDEPENDENT_AMBULATORY_CARE_PROVIDER_SITE_OTHER): Payer: BLUE CROSS/BLUE SHIELD | Admitting: Ophthalmology

## 2018-12-29 DIAGNOSIS — M20011 Mallet finger of right finger(s): Secondary | ICD-10-CM | POA: Diagnosis not present

## 2019-01-20 DIAGNOSIS — D692 Other nonthrombocytopenic purpura: Secondary | ICD-10-CM | POA: Diagnosis not present

## 2019-01-20 DIAGNOSIS — D225 Melanocytic nevi of trunk: Secondary | ICD-10-CM | POA: Diagnosis not present

## 2019-01-20 DIAGNOSIS — L821 Other seborrheic keratosis: Secondary | ICD-10-CM | POA: Diagnosis not present

## 2019-01-20 DIAGNOSIS — L570 Actinic keratosis: Secondary | ICD-10-CM | POA: Diagnosis not present

## 2019-01-20 DIAGNOSIS — L918 Other hypertrophic disorders of the skin: Secondary | ICD-10-CM | POA: Diagnosis not present

## 2019-01-26 DIAGNOSIS — Z4889 Encounter for other specified surgical aftercare: Secondary | ICD-10-CM | POA: Diagnosis not present

## 2019-01-27 DIAGNOSIS — M545 Low back pain: Secondary | ICD-10-CM | POA: Diagnosis not present

## 2019-02-12 ENCOUNTER — Ambulatory Visit: Payer: BLUE CROSS/BLUE SHIELD | Admitting: Obstetrics & Gynecology

## 2019-03-19 ENCOUNTER — Encounter (INDEPENDENT_AMBULATORY_CARE_PROVIDER_SITE_OTHER): Payer: BLUE CROSS/BLUE SHIELD | Admitting: Ophthalmology

## 2019-05-10 ENCOUNTER — Other Ambulatory Visit: Payer: Self-pay

## 2019-05-10 ENCOUNTER — Encounter (INDEPENDENT_AMBULATORY_CARE_PROVIDER_SITE_OTHER): Payer: BC Managed Care – PPO | Admitting: Ophthalmology

## 2019-05-10 DIAGNOSIS — H33303 Unspecified retinal break, bilateral: Secondary | ICD-10-CM

## 2019-05-10 DIAGNOSIS — H43813 Vitreous degeneration, bilateral: Secondary | ICD-10-CM

## 2019-05-10 DIAGNOSIS — H35033 Hypertensive retinopathy, bilateral: Secondary | ICD-10-CM | POA: Diagnosis not present

## 2019-05-10 DIAGNOSIS — I10 Essential (primary) hypertension: Secondary | ICD-10-CM

## 2019-07-15 ENCOUNTER — Other Ambulatory Visit: Payer: Self-pay

## 2019-07-19 ENCOUNTER — Ambulatory Visit (INDEPENDENT_AMBULATORY_CARE_PROVIDER_SITE_OTHER): Payer: BC Managed Care – PPO | Admitting: Obstetrics & Gynecology

## 2019-07-19 ENCOUNTER — Other Ambulatory Visit: Payer: Self-pay

## 2019-07-19 ENCOUNTER — Encounter: Payer: Self-pay | Admitting: Obstetrics & Gynecology

## 2019-07-19 ENCOUNTER — Telehealth: Payer: Self-pay | Admitting: *Deleted

## 2019-07-19 VITALS — BP 136/88 | HR 72 | Temp 97.4°F | Ht 61.0 in | Wt 172.0 lb

## 2019-07-19 DIAGNOSIS — Z01419 Encounter for gynecological examination (general) (routine) without abnormal findings: Secondary | ICD-10-CM

## 2019-07-19 NOTE — Telephone Encounter (Signed)
Left voicemail for patient with [facility] appt information  [facility] Thursday September 3rd at 9:15am with DR. Isaiah Blakes.

## 2019-07-19 NOTE — Progress Notes (Signed)
78 y.o. G3P3 Married White or Caucasian female here for annual exam.  Doing well.  Has a place at [facility].  Was there in early August.  Everyone is doing well.    Had back surgery in January.  Reports she's been doing "okay".  Has not been seen since March.  She never went back for follow-up.  She is still having some leg pain.  Was told the "cage had moved" so does need a follow-up x-ray.  Denies vaginal bleeding.    PCP:  Dr. Forde Dandy.  Had appt at beginning of the year.  Patient's last menstrual period was 11/26/1983.          Sexually active: No.  The current method of family planning is status post hysterectomy.    Exercising: Yes.    some Smoker:  no  Health Maintenance: Pap:  2001 Neg History of abnormal Pap:  no MMG:  07/03/18 BIRADS1:neg.  Knows this is due.  Will schedule for her.   Colonoscopy:  2013 Dr. Cristina Gong BMD:   2017 Normal  TDaP:  2012 Pneumonia vaccine(s):  Done  Shingrix:   Had first in 9/19.  Aware just needs to finish the series.  Up To Date reviewed. Hep C testing: n/a Screening Labs: PCP   reports that she has never smoked. She has never used smokeless tobacco. She reports current alcohol use of about 1.0 standard drinks of alcohol per week. She reports that she does not use drugs.  Past Medical History:  Diagnosis Date  . Arthritis   . Atypical chest pain 10/22/2016  . Chronic back pain   . Exogenous obesity   . GERD (gastroesophageal reflux disease)   . H/O hiatal hernia   . History of kidney stones   . Hypercholesterolemia   . Hyperlipidemia   . Hypertension    12/14/2018 pt. denies  . Hypothyroidism    HX PARTIAL THYROIDECTOMY FOR  NODULE- BENIGN  . Shingles 11/2012  . Spinal stenosis   . Thyroid mass    right, benign  . Varicose vein     Past Surgical History:  Procedure Laterality Date  . ABDOMINAL HYSTERECTOMY  1985  . ANTERIOR LAT LUMBAR FUSION N/A 12/16/2018   Procedure: ANTERIOR LATERAL LUMBAR FUSION L 3-4 with lateral plate;  Surgeon:  Melina Schools, MD;  Location: [place];  Service: Orthopedics;  Laterality: N/A;  3 1/2 hours  . APPENDECTOMY    . BREAST BIOPSY  9/05   stereotactic  . CARPAL TUNNEL RELEASE  12/15   right wrist  . CHOLECYSTECTOMY    . COMBINED AUGMENTATION MAMMAPLASTY AND ABDOMINOPLASTY  1977  . EYE SURGERY  12/05   retinal tear-left  . [place]  2002  . HERNIA REPAIR  2004  . HIATAL HERNIA REPAIR N/A 01/21/2014   Procedure: LAPAROSCOPIC REPAIR RECURRENT  HIATAL HERNIA REDO NISSEN, upper endoscopy;  Surgeon: Pedro Earls, MD;  Location: [facility];  Service: General;  Laterality: N/A;  . KIDNEY STONE SURGERY  2002  . NISSEN FUNDOPLICATION     with cholecystectomy  . ROTATOR CUFF REPAIR  2001  . ROTATOR CUFF REPAIR  1/12    left  . ROTATOR CUFF REPAIR  5/13    open  . SPINAL FUSION  2005   L4  & L5  . THYROIDECTOMY, PARTIAL    . TONSILLECTOMY    . TONSILLECTOMY AND ADENOIDECTOMY     as a child  . UPPER ENDOSCOPY W/ SCLEROTHERAPY  2000  . VARICOSE  VEIN SURGERY  1977    Current Outpatient Medications  Medication Sig Dispense Refill  . b complex vitamins tablet Take 1 tablet by mouth daily.    . Biotin 10 MG CAPS Take by mouth daily.    . Cholecalciferol (VITAMIN D) 50 MCG (2000 UT) CAPS Take by mouth daily.    . Coenzyme Q10 (CO Q 10) 10 MG CAPS Take by mouth daily.    Marland Kitchen esomeprazole (NEXIUM) 40 MG capsule Take 40 mg by mouth daily at 12 noon.    Marland Kitchen levothyroxine (SYNTHROID, LEVOTHROID) 137 MCG tablet Take 137 mcg by mouth daily before breakfast.    . Misc Natural Products (LUTEIN 20 PO) Take by mouth daily.    . montelukast (SINGULAIR) 10 MG tablet TK 1 T PO QD IN THE EVE    . Multiple Vitamin (MULTIVITAMIN) tablet Take 1 tablet by mouth daily.    . TURMERIC PO Take by mouth daily.    Marland Kitchen VITAMIN A PO Take by mouth daily.    . vitamin B-12 (CYANOCOBALAMIN) 100 MCG tablet Take 100 mcg by mouth daily.    . Vitamin Mixture (ESTER-C PO) Take by mouth daily.    . Zinc Sulfate (ZINC 15  PO) Take by mouth daily.     No current facility-administered medications for this visit.     Family History  Problem Relation Age of Onset  . Kidney failure Father        complete shut down  . Heart failure Father   . Pulmonary embolism Mother        amniotic fluid embolism  . Breast cancer Maternal Grandmother 47    Review of Systems  All other systems reviewed and are negative.   Exam:   BP 136/88   Pulse 72   Temp (!) 97.4 F (36.3 C) (Temporal)   Ht 5\' 1"  (1.549 m)   Wt 172 lb (78 kg)   LMP 11/26/1983   BMI 32.50 kg/m    Height: 5\' 1"  (154.9 cm)  Ht Readings from Last 3 Encounters:  07/19/19 5\' 1"  (1.549 m)  12/16/18 5\' 2"  (1.575 m)  12/14/18 5\' 2"  (1.575 m)    General appearance: alert, cooperative and appears stated age Head: Normocephalic, without obvious abnormality, atraumatic Neck: no adenopathy, supple, symmetrical, trachea midline and thyroid normal to inspection and palpation Lungs: clear to auscultation bilaterally Breasts: normal appearance, no masses or tenderness, bilateral encapsulation around implants that is stable Heart: regular rate and rhythm Abdomen: soft, non-tender; bowel sounds normal; no masses,  no organomegaly Extremities: extremities normal, atraumatic, no cyanosis or edema Skin: Skin color, texture, turgor normal. No rashes or lesions Lymph nodes: Cervical, supraclavicular, and axillary nodes normal. No abnormal inguinal nodes palpated Neurologic: Grossly normal   Pelvic: External genitalia:  no lesions              Urethra:  normal appearing urethra with no masses, tenderness or lesions              Bartholins and Skenes: normal                 Vagina: normal appearing vagina with normal color and discharge, no lesions              Cervix: absent              Pap taken: No. Bimanual Exam:  Uterus:  uterus absent              Adnexa: no  mass, fullness, tenderness               Rectovaginal: Confirms               Anus:  normal  sphincter tone, no lesions  Chaperone was present for exam.  A:  Well Woman with normal exam PMP, no HRT H/O elevated lipids, off statins right now Hypothyroidism H/o SUI and OAB, stable H/o ovarian cyst on PUS 9/14.  Neg CT 5/17.  P:   Mammogram guidelines reviewed.  Would like to schedule with [facility] a day that Dr. Isaiah Blakes is in the office pap smear not indicated Blood work will be done with Dr. Forde Dandy this fall Shingrix vaccination discussed.  Started the series but didn't finish.  Up to Date reviewed and completion of series recommended.  Efficacy with this administration unknown.  Pt planning to proceed. return annually or prn

## 2019-07-19 NOTE — Patient Instructions (Signed)
Outpatient Pharmacy at [place],  [place]  [id number] Main: [phone]  Sunday:Closed Monday:7:30 AM - 6:00 PM Tuesday:7:30 AM - 6:00 PM Wednesday:7:30 AM - 6:00 PM Thursday:7:30 AM - 6:00 PM Friday:7:30 AM - 6:00 PM Saturday:Closed

## 2019-07-28 DIAGNOSIS — J301 Allergic rhinitis due to pollen: Secondary | ICD-10-CM | POA: Diagnosis not present

## 2019-07-28 DIAGNOSIS — J3089 Other allergic rhinitis: Secondary | ICD-10-CM | POA: Diagnosis not present

## 2019-07-29 DIAGNOSIS — Z1231 Encounter for screening mammogram for malignant neoplasm of breast: Secondary | ICD-10-CM | POA: Diagnosis not present

## 2019-07-29 DIAGNOSIS — Z803 Family history of malignant neoplasm of breast: Secondary | ICD-10-CM | POA: Diagnosis not present

## 2019-08-22 ENCOUNTER — Other Ambulatory Visit: Payer: Self-pay | Admitting: Cardiovascular Disease

## 2019-08-26 DIAGNOSIS — L820 Inflamed seborrheic keratosis: Secondary | ICD-10-CM | POA: Diagnosis not present

## 2019-09-02 DIAGNOSIS — H6123 Impacted cerumen, bilateral: Secondary | ICD-10-CM | POA: Diagnosis not present

## 2019-09-18 ENCOUNTER — Encounter

## 2019-11-01 DIAGNOSIS — Z20828 Contact with and (suspected) exposure to other viral communicable diseases: Secondary | ICD-10-CM | POA: Diagnosis not present

## 2019-11-09 DIAGNOSIS — M9904 Segmental and somatic dysfunction of sacral region: Secondary | ICD-10-CM | POA: Diagnosis not present

## 2019-11-09 DIAGNOSIS — Z981 Arthrodesis status: Secondary | ICD-10-CM | POA: Diagnosis not present

## 2019-11-17 DIAGNOSIS — R269 Unspecified abnormalities of gait and mobility: Secondary | ICD-10-CM | POA: Diagnosis not present

## 2019-11-17 DIAGNOSIS — M545 Low back pain: Secondary | ICD-10-CM | POA: Diagnosis not present

## 2019-11-18 DIAGNOSIS — M5416 Radiculopathy, lumbar region: Secondary | ICD-10-CM | POA: Diagnosis not present

## 2019-12-01 ENCOUNTER — Telehealth: Payer: Self-pay | Admitting: Cardiovascular Disease

## 2019-12-01 NOTE — Telephone Encounter (Signed)
Spoke to patient's husband, Dr. Gladstone Lighter.  Last night she had an episode of severe GERD associated with radiation to her jaw.  After taking GERD medication symptoms have resolved.  She had some epigstric tenderness that is now better as well.  She denies chest pain, shortness of breath, edema, orthopnea, PND or nausea.  She now feels back to baseline and has no exertional symptoms.  Suspect this is GI related and cardiac.  She will continue to monitor symptoms.  If they recur or happen with exertion we will schedule follow up and consider coronary CT-A.  Theresa Goodwill C. Oval Linsey, MD, [facility] 12/01/2019 1:32 PM

## 2019-12-01 NOTE — Telephone Encounter (Signed)
Spoke with Dr. Gladstone Lighter [phone] and advised him that Dr. Oval Linsey is at the hospital today and it in office but will forward his message to her.. he reports no emergency but he has a few questions for her re: the pt/ his wife... Will forward message to her for her review.

## 2019-12-01 NOTE — Telephone Encounter (Signed)
Husband of patient, Dr. Gladstone Lighter, from [facility] has requested for Dr. Oval Linsey to return his call in regards to his wife. Per Tammy from [facility]. Please call.  [phone]

## 2019-12-01 NOTE — Telephone Encounter (Signed)
Thank you :)

## 2020-01-03 ENCOUNTER — Ambulatory Visit: Payer: Self-pay | Attending: Internal Medicine

## 2020-01-03 DIAGNOSIS — Z23 Encounter for immunization: Secondary | ICD-10-CM | POA: Insufficient documentation

## 2020-01-03 NOTE — Progress Notes (Signed)
   Covid-19 Vaccination Clinic  Name:  Theresa Holmes    MRN: [id number] DOB: May 15, 1941  01/03/2020  Theresa Holmes was observed post Covid-19 immunization for 15 minutes without incidence. She was provided with Vaccine Information Sheet and instruction to access the V-Safe system.   Theresa Holmes was instructed to call 911 with any severe reactions post vaccine: Marland Kitchen Difficulty breathing  . Swelling of your face and throat  . A fast heartbeat  . A bad rash all over your body  . Dizziness and weakness    Immunizations Administered    Name Date Dose VIS Date Route   Pfizer COVID-19 Vaccine 01/03/2020  2:14 PM 0.3 mL 11/05/2019 Intramuscular   Manufacturer: [place]   Lot: [id number]   [place]: [id number]

## 2020-01-28 ENCOUNTER — Ambulatory Visit: Payer: Self-pay | Attending: Internal Medicine

## 2020-01-28 DIAGNOSIS — Z23 Encounter for immunization: Secondary | ICD-10-CM | POA: Insufficient documentation

## 2020-01-28 NOTE — Progress Notes (Signed)
   Covid-19 Vaccination Clinic  Name:  Theresa Holmes    MRN: [id number] DOB: 08-24-1941  01/28/2020  [name] was observed post Covid-19 immunization for 15 minutes without incident. She was provided with Vaccine Information Sheet and instruction to access the V-Safe system.   [name] was instructed to call 911 with any severe reactions post vaccine: Marland Kitchen Difficulty breathing  . Swelling of face and throat  . A fast heartbeat  . A bad rash all over body  . Dizziness and weakness   Immunizations Administered    Name Date Dose VIS Date Route   Pfizer COVID-19 Vaccine 01/28/2020  8:39 AM 0.3 mL 11/05/2019 Intramuscular   Manufacturer: [place]   Lot: [id number]   [place]: [id number]

## 2020-05-09 ENCOUNTER — Encounter (INDEPENDENT_AMBULATORY_CARE_PROVIDER_SITE_OTHER): Payer: PRIVATE HEALTH INSURANCE | Admitting: Ophthalmology

## 2020-05-09 ENCOUNTER — Other Ambulatory Visit: Payer: Self-pay

## 2020-05-09 DIAGNOSIS — H43813 Vitreous degeneration, bilateral: Secondary | ICD-10-CM | POA: Diagnosis not present

## 2020-05-09 DIAGNOSIS — I10 Essential (primary) hypertension: Secondary | ICD-10-CM

## 2020-05-09 DIAGNOSIS — H33303 Unspecified retinal break, bilateral: Secondary | ICD-10-CM

## 2020-05-09 DIAGNOSIS — H35033 Hypertensive retinopathy, bilateral: Secondary | ICD-10-CM

## 2020-08-29 ENCOUNTER — Ambulatory Visit: Payer: Medicare Other | Admitting: Obstetrics & Gynecology

## 2021-02-09 ENCOUNTER — Encounter: Payer: Self-pay | Admitting: Obstetrics & Gynecology

## 2021-04-03 ENCOUNTER — Telehealth: Payer: Self-pay | Admitting: Internal Medicine

## 2021-04-03 NOTE — Telephone Encounter (Signed)
Spoke to patient's spouse, Dr. Dan Humphreys). Dr. Gladstone Lighter stated that patient has a deep productive cough. Coughing spells worsens at night. Sx have been present for 1 week. Patient seen PCP Monday and was prescribe prednisone, zpak, allegra and hydromet with mild relief in sx. Patient is fully vaccinated against covid and flu.  Patient last seen in 2017. Would like to schedule appt on wednesday or Thursday of this week. Dr. Melvyn Novas does not have availability.  Dr. Melvyn Novas, please advise. thanks

## 2021-04-03 NOTE — Telephone Encounter (Signed)
Go ahead and double the dose of nexium for now:  Take 30- 60 min before your first and last meals of the day  And add on to Wednesday or  Friday's schedule at end of day  (can't do Thursday)

## 2021-04-03 NOTE — Telephone Encounter (Signed)
Called and spoke with pts husband and MW had an opening on WED at 25.  I have placed the pt in that appt slot.  They are aware.  Nothing further is needed.

## 2021-04-04 ENCOUNTER — Other Ambulatory Visit: Payer: Self-pay

## 2021-04-04 ENCOUNTER — Ambulatory Visit (INDEPENDENT_AMBULATORY_CARE_PROVIDER_SITE_OTHER): Payer: PRIVATE HEALTH INSURANCE

## 2021-04-04 ENCOUNTER — Ambulatory Visit (INDEPENDENT_AMBULATORY_CARE_PROVIDER_SITE_OTHER): Payer: PRIVATE HEALTH INSURANCE | Admitting: Internal Medicine

## 2021-04-04 ENCOUNTER — Encounter: Payer: Self-pay | Admitting: Internal Medicine

## 2021-04-04 DIAGNOSIS — R051 Acute cough: Secondary | ICD-10-CM | POA: Diagnosis not present

## 2021-04-04 MED ORDER — AMOXICILLIN-POT CLAVULANATE 875-125 MG PO TABS: 1.0000 | ORAL_TABLET | Freq: Two times a day (BID) | ORAL | 0 refills | Status: AC

## 2021-04-04 MED ORDER — PREDNISONE 10 MG PO TABS: ORAL_TABLET | ORAL | 0 refills | Status: DC

## 2021-04-04 NOTE — Patient Instructions (Addendum)
Augmentin 875 mg take one pill twice daily  X 10 days - take at breakfast and supper with large glass of water.  It would help reduce the usual side effects (diarrhea and yeast infections) if you ate cultured yogurt at lunch.   Prednisone 10 mg take  4 each am x 2 days,   2 each am x 2 days,  1 each am x 2 days and stop   Pantopraxole  40 mg Take 30- 60 min before your first and last meals of the day   GERD (REFLUX)  is an extremely common cause of respiratory symptoms just like yours , many times with no obvious heartburn at all.    It can be treated with medication, but also with lifestyle changes including elevation of the head of your bed (ideally with 6-8inch blocks under the headboard of your bed),  Smoking cessation, avoidance of late meals, excessive alcohol, and avoid fatty foods, chocolate, peppermint, colas, red wine, and acidic juices such as orange juice.  NO MINT OR MENTHOL PRODUCTS SO NO COUGH DROPS  USE SUGARLESS CANDY INSTEAD (Jolley ranchers or Stover's or Life Savers) or even ice chips will also do - the key is to swallow to prevent all throat clearing. NO OIL BASED VITAMINS - use powdered substitutes.  Avoid fish oil when coughing.  Take delsym otc 2 tsp every 12 hours and supplement with the hydrocodone cough sym until cough is minimal   No inhalers   Call me 5/13 pm if not improving on this regimen

## 2021-04-04 NOTE — Progress Notes (Signed)
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 Pulmonary Consult    Self referral for abn CT Chest.  Pt having some LUQ pain, esp worse when she takes a deep breath or bends.  onset of pain was abrupt with rad to L shoulder and pleuritic quality than has eased up the last few days but noted much worse lying back initially and  doe such that every 3 gates at [facility] airport 04/24/16 and still uncomfortable with more than slow adls.  No cough fever or chills. rec Augmentin 875 mg take one pill twice daily  X 10 days   05/03/2016  f/u ov/Theresa Holmes re: prob asp pna Chief Complaint  Patient presents with  . Follow-up    Much improved, no new co's today  Able to lie back ok s cp/sob/ never coughed up anything/ no fever Still some fatigue with yardwork rec Finish up all your antibiotics  Return as needed   04/03/21 PC deep productive cough. Coughing spells worsen  at night. Sx have been present for 1 week. Patient seen PCP 04/02/21 and rx  prednisone, zpak, allegra and hydromet with mild relief in sx. Patient is fully vaccinated against covid and flu.      04/04/2021  Acute ov/Theresa Holmes re: cough onset 5/5 with nasal congestion with yellow mucus  rx by pcp zpak / 6 total pills  Chief Complaint  Patient presents with  . Acute Visit    Productive cough but unable to spit up any mucus. Coughing constantly for 5-10 minutes. Cough started 4-5 days ago. Worse at night and in  the evening. Hoarse.   Dyspnea:  Not really sob  Cough: severe coughing fits  Sleeping: flat on pillow SABA use: none 02: none  Covid status:   vax x 3    No obvious day to day or daytime variability or assoc excess/ purulent sputum or mucus plugs or hemoptysis or cp or chest tightness, subjective wheeze or overt sinus or hb symptoms.    Also denies any obvious fluctuation of symptoms with weather or environmental changes or other aggravating or alleviating factors except as outlined above   No unusual exposure hx or h/o childhood pna/ asthma or knowledge of premature birth.  Current Allergies, Complete Past Medical History, Past Surgical History, Family History, and Social History were reviewed in [employer] record.  ROS  The following are not active complaints unless bolded Hoarseness, sore throat, dysphagia, dental problems, itching, sneezing,  nasal congestion or discharge of excess mucus or purulent secretions, ear ache,   fever, chills, sweats, unintended wt loss or wt gain, classically pleuritic or exertional cp,  orthopnea pnd or arm/hand swelling  or leg swelling, presyncope, palpitations, abdominal pain, anorexia, nausea, vomiting, diarrhea  or change in bowel habits or change in bladder habits, change in stools or change in urine, dysuria, hematuria,  rash, arthralgias, visual complaints, headache, numbness, weakness or ataxia or problems with walking or coordination,  change in mood or  memory.        Current Meds  Medication Sig  . albuterol (VENTOLIN HFA) 108 (90 Base) MCG/ACT inhaler SMARTSIG:1-2 Puff(s) Via Inhaler Every 4-6 Hours PRN  . azithromycin (ZITHROMAX) 250 MG tablet Take 250 mg by mouth as directed.  Marland Kitchen b complex vitamins tablet Take 1 tablet by mouth daily.  . Biotin 10 MG CAPS Take by mouth daily.  . Cholecalciferol (VITAMIN D) 50 MCG (2000 UT) CAPS Take by mouth daily.  . Coenzyme Q10 (CO Q 10) 10 MG CAPS Take by mouth daily.  Marland Kitchen  esomeprazole (NEXIUM) 40 MG capsule Take 40 mg by mouth daily at 12 noon.  Marland Kitchen HYDROMET 5-1.5 MG/5ML syrup Take 5 mLs by mouth every 4 (four) hours as needed.  Marland Kitchen levothyroxine (SYNTHROID, LEVOTHROID) 137 MCG tablet Take 137 mcg by mouth daily before breakfast.  . Misc Natural Products (LUTEIN 20 PO) Take by mouth daily.  . montelukast (SINGULAIR) 10 MG tablet TK 1 T PO QD IN THE EVE  . Multiple Vitamin (MULTIVITAMIN) tablet Take 1 tablet by mouth daily.  . predniSONE (DELTASONE) 10 MG tablet Take 10 mg by mouth 2 (two) times daily.  . Probiotic TBEC See admin instructions.  Marland Kitchen VITAMIN A PO Take by mouth daily.  . vitamin B-12 (CYANOCOBALAMIN) 100 MCG tablet Take 100 mcg by mouth daily.  . Vitamin Mixture (ESTER-C PO) Take by mouth daily.  . Zinc Sulfate (ZINC 15 PO) Take by mouth daily.                    Objective:   Physical Exam       04/04/2021       177 05/03/2016         171   04/26/16 177 lb 6.4 oz (80.468 kg)  03/10/15 204 lb 12.8 oz (92.897 kg)  02/04/14 190 lb (86.183 kg)     Vital signs reviewed  04/04/2021  - Note at rest 02 sats  97% on RA   General appearance:    Pleasant amb wf nad    HEENT : pt wearing mask not removed for exam due to covid -19 concerns.    NECK :  without JVD/Nodes/TM/ nl carotid upstrokes bilaterally   LUNGS: no acc muscle use,  Nl contour chest which is clear to A and P bilaterally without cough on insp or exp maneuvers   CV:  RRR  no s3 or murmur or increase in P2, and no edema   ABD:  soft and nontender with nl inspiratory excursion in the supine position. No bruits or organomegaly appreciated, bowel sounds nl  MS:  Nl gait/ ext warm without deformities, calf tenderness, cyanosis or clubbing No obvious joint restrictions   SKIN: warm and dry without lesions    NEURO:  alert, approp, nl sensorium with  no motor or cerebellar deficits apparent.       CXR PA and Lateral:   04/04/2021 :    I personally reviewed images / impression  as follows:   No acute dz      Assessment:

## 2021-04-04 NOTE — Assessment & Plan Note (Addendum)
Onset: 03/29/21 in setting of ? URI with rhinitis/sinusitis    Of the three most common causes of  Sub-acute / recurrent or chronic cough, only one (GERD)  can actually contribute to/ trigger  the other two (asthma and post nasal drip syndrome)  and perpetuate the cylce of cough.  While not intuitively obvious, many patients with chronic low grade reflux do not cough until there is a primary insult that disturbs the protective epithelial barrier and exposes sensitive nerve endings.   This is typically viral but can due to PNDS and  either may apply here.     >>> The point is that once this occurs, it is difficult to eliminate the cycle  using anything but a maximally effective acid suppression regimen at least in the short run, accompanied by an appropriate diet to address non acid GERD and control / eliminate the cough itself for at least 3 days with delsym supplemented by hydrocodone >>> also so added 6 day taper off  Prednisone starting at 40 mg per day in case of component of Th-2 driven upper or lower airways inflammation (if cough responds short term only to relapse before return while will on full rx for uacs (as above), then  that would point to allergic rhinitis/ asthma or eos bronchitis as alternative dx) plus add augmentin for possible sinusitis     Call if not improving w/in 48 h          Each maintenance medication was reviewed in detail including emphasizing most importantly the difference between maintenance and prns and under what circumstances the prns are to be triggered using an action plan format where appropriate.  Total time for H and P, chart review, counseling and generating customized AVS unique to this  Acute office visit / same day charting = 30 min

## 2021-05-09 ENCOUNTER — Encounter (INDEPENDENT_AMBULATORY_CARE_PROVIDER_SITE_OTHER): Payer: PRIVATE HEALTH INSURANCE | Admitting: Ophthalmology

## 2021-05-09 ENCOUNTER — Other Ambulatory Visit: Payer: Self-pay

## 2021-05-09 DIAGNOSIS — H43813 Vitreous degeneration, bilateral: Secondary | ICD-10-CM | POA: Diagnosis not present

## 2021-05-09 DIAGNOSIS — H35033 Hypertensive retinopathy, bilateral: Secondary | ICD-10-CM

## 2021-05-09 DIAGNOSIS — H33303 Unspecified retinal break, bilateral: Secondary | ICD-10-CM

## 2021-05-09 DIAGNOSIS — I10 Essential (primary) hypertension: Secondary | ICD-10-CM

## 2021-06-12 ENCOUNTER — Ambulatory Visit (HOSPITAL_BASED_OUTPATIENT_CLINIC_OR_DEPARTMENT_OTHER): Payer: Self-pay | Admitting: Obstetrics & Gynecology

## 2021-06-14 ENCOUNTER — Ambulatory Visit (INDEPENDENT_AMBULATORY_CARE_PROVIDER_SITE_OTHER): Payer: No Typology Code available for payment source | Admitting: Obstetrics & Gynecology

## 2021-06-14 ENCOUNTER — Encounter (HOSPITAL_BASED_OUTPATIENT_CLINIC_OR_DEPARTMENT_OTHER): Payer: Self-pay | Admitting: Obstetrics & Gynecology

## 2021-06-14 ENCOUNTER — Other Ambulatory Visit: Payer: Self-pay

## 2021-06-14 VITALS — BP 166/84 | HR 93 | Ht 60.5 in | Wt 183.8 lb

## 2021-06-14 DIAGNOSIS — Z01419 Encounter for gynecological examination (general) (routine) without abnormal findings: Secondary | ICD-10-CM | POA: Diagnosis not present

## 2021-06-14 DIAGNOSIS — R413 Other amnesia: Secondary | ICD-10-CM | POA: Diagnosis not present

## 2021-06-14 DIAGNOSIS — Z780 Asymptomatic menopausal state: Secondary | ICD-10-CM | POA: Diagnosis not present

## 2021-06-14 DIAGNOSIS — N3946 Mixed incontinence: Secondary | ICD-10-CM

## 2021-06-14 MED ORDER — MIRABEGRON ER 25 MG PO TB24: 25.0000 mg | ORAL_TABLET | Freq: Every day | ORAL | 1 refills | Status: DC

## 2021-06-14 NOTE — Progress Notes (Signed)
80 y.o. G3P3 Married White or Caucasian female here for breast and pelvic exam.  Continues to have back issues and back pain.  Denies vaginal bleeding.  Having issues with urinary incontinence.  Has urinary urgency.  Willing to try medication.  Options reviewed.  Side effects discussed.  Will start with Myrbetriq.  May need urogyn referral.  Having some issues with work recall and recall of details.  Hasn't gotten lost.  Family is concerned and pt brought note for me to read.  Pt would be open to medication if would help memory.  Discussed with pt neurology referral.  She is fine with this.  Patient's last menstrual period was 11/26/1983.          Sexually active: No.  H/O STD:  no  Health Maintenance: PCP:  Dr. Forde Dandy.  Last wellness appt was 02/2021.  Did blood work at that appt:  yes Vaccines are up to date:  does with Dr. Forde Dandy. Colonoscopy:  07/24/2012, Dr. Cristina Gong.  Screening follow up not recommended. MMG:  02/09/2021 BMD:  04/04/2016.  Will fax order to [facility] for bone density. Last pap smear:  not indicated.   H/o abnormal pap smear:  no   reports that she has never smoked. She has never used smokeless tobacco. She reports current alcohol use of about 1.0 standard drink of alcohol per week. She reports that she does not use drugs.  Past Medical History:  Diagnosis Date   Arthritis    Atypical chest pain 10/22/2016   Chronic back pain    Exogenous obesity    GERD (gastroesophageal reflux disease)    H/O hiatal hernia    History of kidney stones    Hypercholesterolemia    Hyperlipidemia    Hypertension    12/14/2018 pt. denies   Hypothyroidism    HX PARTIAL THYROIDECTOMY FOR  NODULE- BENIGN   Shingles 11/2012   Spinal stenosis    Thyroid mass    right, benign   Varicose vein     Past Surgical History:  Procedure Laterality Date   ABDOMINAL HYSTERECTOMY  1985   ANTERIOR LAT LUMBAR FUSION N/A 12/16/2018   Procedure: ANTERIOR LATERAL LUMBAR FUSION L 3-4 with lateral plate;   Surgeon: Melina Schools, MD;  Location: [facility];  Service: Orthopedics;  Laterality: N/A;  3 1/2 hours   APPENDECTOMY     BREAST BIOPSY  9/05   stereotactic   CARPAL TUNNEL RELEASE  12/15   right wrist   CHOLECYSTECTOMY     COMBINED AUGMENTATION MAMMAPLASTY AND ABDOMINOPLASTY  1977   EYE SURGERY  12/05   retinal tear-left   HAMMER TOE SURGERY  2002   HERNIA REPAIR  2004   HIATAL HERNIA REPAIR N/A 01/21/2014   Procedure: LAPAROSCOPIC REPAIR RECURRENT  HIATAL HERNIA REDO NISSEN, upper endoscopy;  Surgeon: Pedro Earls, MD;  Location: [facility];  Service: General;  Laterality: N/A;   KIDNEY STONE SURGERY  [id number]   NISSEN FUNDOPLICATION     with cholecystectomy   ROTATOR CUFF REPAIR  2001   ROTATOR CUFF REPAIR  1/12    left   ROTATOR CUFF REPAIR  5/13    open   SPINAL FUSION  2005   L4  & L5   THYROIDECTOMY, PARTIAL     TONSILLECTOMY     TONSILLECTOMY AND ADENOIDECTOMY     as a child   UPPER ENDOSCOPY W/ SCLEROTHERAPY  2000   [facility]    Current Outpatient Medications  Medication  Sig Dispense Refill   b complex vitamins tablet Take 1 tablet by mouth daily.     Biotin 10 MG CAPS Take by mouth daily.     Cholecalciferol (VITAMIN D) 50 MCG (2000 UT) CAPS Take by mouth daily.     esomeprazole (NEXIUM) 40 MG capsule Take 40 mg by mouth daily at 12 noon.     levothyroxine (SYNTHROID, LEVOTHROID) 137 MCG tablet Take 137 mcg by mouth daily before breakfast.     Misc Natural Products (LUTEIN 20 PO) Take by mouth daily.     Multiple Vitamin (MULTIVITAMIN) tablet Take 1 tablet by mouth daily.     Probiotic TBEC See admin instructions.     VITAMIN A PO Take by mouth daily.     vitamin B-12 (CYANOCOBALAMIN) 100 MCG tablet Take 100 mcg by mouth daily.     Vitamin Mixture (ESTER-C PO) Take by mouth daily.     Zinc Sulfate (ZINC 15 PO) Take by mouth daily.     No current facility-administered medications for this visit.    Family History  Problem Relation Age of Onset    Kidney failure Father        complete shut down   Heart failure Father    Pulmonary embolism Mother        amniotic fluid embolism   Breast cancer Maternal Grandmother 80    Review of Systems  Constitutional: Negative.   Gastrointestinal: Negative.   Genitourinary:  Positive for urgency. Negative for vaginal bleeding, vaginal discharge and vaginal pain.   Exam:   BP (!) 166/84 (BP Location: Right Arm, Patient Position: Sitting, Cuff Size: Large)   Pulse 93   Ht 5' 0.5" (1.537 m)   Wt 183 lb 12.8 oz (83.4 kg)   LMP 11/26/1983   BMI 35.31 kg/m   Height: 5' 0.5" (153.7 cm)  General appearance: alert, cooperative and appears stated age Breasts: normal appearance, no masses or tenderness Abdomen: soft, non-tender; bowel sounds normal; no masses,  no organomegaly Lymph nodes: Cervical, supraclavicular, and axillary nodes normal.  No abnormal inguinal nodes palpated Neurologic: Grossly normal  Pelvic: External genitalia:  no lesions              Urethra:  normal appearing urethra with no masses, tenderness or lesions              Bartholins and Skenes: normal                 Vagina: normal appearing vagina with atrophic changes and no discharge, no lesions              Cervix: absent              Pap taken: No. Bimanual Exam:  Uterus:  uterus absent              Adnexa: no mass, fullness, tenderness               Rectovaginal: Confirms               Anus:  normal sphincter tone, no lesions  Chaperone, Octaviano Batty, CMA, was present for exam.  Assessment/Plan: 1. Gynecologic exam normal - Pap smear not indicated - MMG 318/2022 - Colonoscopy 06/2012.  Follow for screening not recommended. - BMD order will be placed for pt - vaccines updated - lab work done April, 2022, with Dr. Forde Dandy  2. Memory changes - Ambulatory referral to Neurology  3. Postmenopausal - no HRT  4.  Mixed stress and urge urinary incontinence - mirabegron ER (MYRBETRIQ) 25 MG TB24 tablet; Take 1  tablet (25 mg total) by mouth daily. One po qd  Dispense: 30 tablet; Refill: 1 - pt will monitor BP and let me know if increases.  Aware will need to stop medication if this occurs - may benefit from urogyn referral but will start medication first

## 2021-06-17 DIAGNOSIS — Z780 Asymptomatic menopausal state: Secondary | ICD-10-CM | POA: Insufficient documentation

## 2021-06-17 DIAGNOSIS — N3946 Mixed incontinence: Secondary | ICD-10-CM | POA: Insufficient documentation

## 2021-06-26 ENCOUNTER — Telehealth (HOSPITAL_BASED_OUTPATIENT_CLINIC_OR_DEPARTMENT_OTHER): Payer: Self-pay

## 2021-06-26 NOTE — Telephone Encounter (Signed)
Called and spoke with patient about the prior-authorization needed for her Myrbetriq '25mg'$ . Let patient know that we have filled out the paperwork and typed a letter for supporting information and is now waiting on the determination to be made by insurance company.   Patient wanted Dr. Sabra Heck to know that her husband has advised her to take "prevegin" before she goes in to see the neurologist. Her husband does not think she needs to see a neurologist right now. She states that she only forgets things "sometimes". tbw

## 2021-07-02 ENCOUNTER — Other Ambulatory Visit (HOSPITAL_BASED_OUTPATIENT_CLINIC_OR_DEPARTMENT_OTHER): Payer: Self-pay | Admitting: Obstetrics & Gynecology

## 2021-07-03 ENCOUNTER — Telehealth (HOSPITAL_BASED_OUTPATIENT_CLINIC_OR_DEPARTMENT_OTHER): Payer: Self-pay

## 2021-07-03 NOTE — Telephone Encounter (Signed)
Called patient on 07/02/2021 to let patient know that we have received an approval for her Myrbetriq.Patient expresses understanding. tbw

## 2021-07-10 ENCOUNTER — Encounter (HOSPITAL_BASED_OUTPATIENT_CLINIC_OR_DEPARTMENT_OTHER): Payer: Self-pay | Admitting: Obstetrics & Gynecology

## 2021-07-10 ENCOUNTER — Telehealth (HOSPITAL_BASED_OUTPATIENT_CLINIC_OR_DEPARTMENT_OTHER): Payer: Self-pay

## 2021-07-10 NOTE — Telephone Encounter (Signed)
Called patient about Dexa Scan. Patient wanted you to know that she did pick up the Myrbetriq that was prescribed. It cost her $150 even with the prior authorization. Patient states she did not get the opportunity to take medication because the leakage stopped. It has now been a week and still no leakage. Patient states she will hold on to the medication and just watch things for now. Will give the office a call back if she has any questions or concerns to come up. tbw

## 2021-09-17 ENCOUNTER — Other Ambulatory Visit (HOSPITAL_BASED_OUTPATIENT_CLINIC_OR_DEPARTMENT_OTHER): Payer: Self-pay | Admitting: *Deleted

## 2021-09-17 DIAGNOSIS — N3946 Mixed incontinence: Secondary | ICD-10-CM

## 2021-09-17 MED ORDER — MIRABEGRON ER 25 MG PO TB24: 25.0000 mg | ORAL_TABLET | Freq: Every day | ORAL | 1 refills | Status: DC

## 2021-10-30 ENCOUNTER — Telehealth: Payer: Self-pay | Admitting: Neurology

## 2021-10-30 ENCOUNTER — Encounter: Payer: Self-pay | Admitting: Neurology

## 2021-10-30 ENCOUNTER — Ambulatory Visit (INDEPENDENT_AMBULATORY_CARE_PROVIDER_SITE_OTHER): Payer: No Typology Code available for payment source | Admitting: Neurology

## 2021-10-30 VITALS — BP 121/71 | HR 67 | Ht 62.0 in | Wt 168.4 lb

## 2021-10-30 DIAGNOSIS — Z818 Family history of other mental and behavioral disorders: Secondary | ICD-10-CM | POA: Diagnosis not present

## 2021-10-30 DIAGNOSIS — G479 Sleep disorder, unspecified: Secondary | ICD-10-CM

## 2021-10-30 DIAGNOSIS — R7309 Other abnormal glucose: Secondary | ICD-10-CM

## 2021-10-30 DIAGNOSIS — R413 Other amnesia: Secondary | ICD-10-CM | POA: Diagnosis not present

## 2021-10-30 DIAGNOSIS — E538 Deficiency of other specified B group vitamins: Secondary | ICD-10-CM | POA: Diagnosis not present

## 2021-10-30 DIAGNOSIS — E559 Vitamin D deficiency, unspecified: Secondary | ICD-10-CM | POA: Diagnosis not present

## 2021-10-30 NOTE — Telephone Encounter (Signed)
Medicare/medcost order sent to GI, they will obtain the auth for medcost and reach out to the patient to schedule.

## 2021-10-30 NOTE — Patient Instructions (Addendum)
MIND from [facility] XX Brain MRI brain Bloodwork Formal memory testing Sleep study  Recommendations to prevent or slow progression of cognitive decline:   Exercise You should increase exercise 30 to 45 minutes per day at least 3 days a week although 5 to 7 would be preferred. Any type of exercise (including walking) is acceptable although a recumbent bicycle may be best if you are unsteady. Disease related apathy can be a significant roadblock to exercise and the only way to overcome this is to make it a daily routine and perhaps have a reward at the end (something your loved one loves to eat or drink perhaps) or a personal trainer coming to the home can also be very useful. In general a structured, repetitive schedule is best.   Cardiovascular Health: You should optimize all cardiovascular risk factors (blood pressure, sugar, cholesterol) as vascular disease such as strokes and heart attacks can make memory problems much worse.   Diet: Eating a heart healthy (Mediterranean) diet is also a good idea; fish and poultry instead of red meat, nuts (mostly non-peanuts), vegetables, fruits, olive oil or canola oil (instead of butter), minimal salt (use other spices to flavor foods), whole grain rice, bread, cereal and pasta and wine in moderation.  General Health: Any diseases which effect your body will effect your brain such as a pneumonia, urinary infection, blood clot, heart attack or stroke. Keep contact with your primary care doctor for regular follow ups.  Sleep. A good nights sleep is healthy for the brain. Seven hours is recommended. If you have insomnia or poor sleep habits see the recommendations below  Tips: Structured and consistent daytime and nighttime routine, including regular wake times, bedtimes, and mealtimes, will be important for the patient to avoid confusion. Keeping frequently used items in designated places will help reduce stress from searching. If there are worries about  getting lost do not let the patient leave home unaccompanied. They might benefit from wearing an identification bracelet that will help others assist in finding home if they become lost. Information about nationwide safe return services and other helpful resources may be obtained through the Alzheimer's Association helpline at [phone].  Finances, Power of Producer, television/film/video Directives: You should consider putting legal safeguards in place with regard to financial and medical decision making. While the spouse always has power of attorney for medical and financial issues in the absence of any form, you should consider what you want in case the spouse / caregiver is no longer around or capable of making decisions.   [place] : http://www.welch.com/.pdf  Or Google "[place]" AND "An Forensic scientist for [employer]  Other States: ApartmentMom.com.ee  The signature on these forms should be notarized.   DRIVING:   Driving only during the day Drive only to familiar Locations Avoid driving during bad weather  If you would like to be tested to see if you are driving safely, [facility] has a Clinical Driving Evaluation. To schedule an appointment call [phone].                RESOURCES:  Memory Loss: Improve your short term memory By Silvio Pate  The Alzheimer's Reading Room http://www.alzheimersreadingroom.com/   The Alzheimer's Compendium http://www.alzcompend.info/  [employer] www.dukefamilysupport.UJW ([phone]  Recommended resources for caregivers (All can be purchased on [place] [employer]):  1) A Caregiver's Guide to Dementia: Using Activities and Other Strategies to Prevent, Reduce and Manage Behavioral Symptoms by Osie Bond. Tyler Aas and [employer]   2)  A Caregiver's Guide to Lewy Body Dementia by Caleen Essex MS BSN and Gaston Islam   3) What If It's Not Alzheimer's?: A Caregiver's Guide to Dementia by Koren Shiver (Author), Octaviano Batty (Editor)  3) The 36 hour day by Rabins and Mace  4) Understanding Difficult Behaviors by Merita Norton and White  Online course for helping caregivers reduce stress, guilt and frustration called the Caregivers Helpbook. The website is www.powerfultoolsforcaregivers.org  As a caregiver you are a Art gallery manager. Problems you face as a caregiver are usually unique to your situation and the way your loved-one's disease manifests itself. The best way to use these books is to look at the Table of Contents and read any chapters of interest or that apply to challenges you are having as a caregiver.  NATIONAL RESOURCES: For more information on neurological disorders or research programs funded by the [employer] of Neurological Disorders and Stroke, contact the Institute's Agricultural consultant (BRAIN) at: [place] [address] [phone] (toll-free) MasterBoxes.it  Information on dementia is also available from the following organizations: Alzheimer's Disease Education and Referral (Olds) [place] on Aging [address] [place] [address] [phone] (toll-free) DVDEnthusiasts.nl  Alzheimer's Association [address] [place] [address] [phone] (toll-free, 24-hour helpline) [phone] (TDD) CapitalMile.co.nz  Alzheimer's Foundation of America [address], [place] [address] [phone] (toll-free) www.alzfdn.org  Alzheimer's [facility] [address] [place] [address] [phone] www.alzdiscovery.org  Association for [place] [address] [place] [address] [phone] (toll-free) [place] [address] [phone] (toll-free) www.brightfocus.org/alzheimers  Doran Stabler [facility] Alzheimer's Foundation [address], [place] [address] [phone] www.https://lambert-jackson.net/  Lewy Body Dementia Association [address] [place] [address] [phone] (toll-free LBD Caregiver Link) www.lbda.[place], [place], [place] [address] [phone] (toll-free) [phone] [facility]) https://carter.com/  National Organization for Rare Disorders [address] [place] [address] 9-450-388-EKCM [phone]) (toll-free) www.rarediseases.org  The Dementias: Hope Through Research was jointly produced by the [employer] of Neurological Disorders and Stroke (NINDS) and the [employer] on Aging (NIA), both part of the [employer], the [employer] research agency--supporting scientific studies that turn discovery into health. NINDS is the nation's leading funder of research on the brain and nervous system. The NINDS mission is to reduce the burden of neurological disease. For more information and resources, visit MasterBoxes.it [1] or call 8253075658. NIA leads the federal government effort conducting and supporting research on aging and the health and well-being of older people. NIA's Alzheimer's Disease Education and Referral (ADEAR) Center offers information and publications on dementia and caregiving for families, caregivers, and professionals. For more information, visit DVDEnthusiasts.nl [2] or call ([phone]. Also available from NIA are publications and information about Alzheimer's disease as well as the booklets Frontotemporal Disorders: Information for Patients, Families, and Caregivers and Lewy Body Dementia: Information for Patients, Families, and  Professionals. Source URL: SocialSpecialists.co.nz    Memory Compensation Strategies  Use "WARM" strategy.  W= write it down  A= associate it  R= repeat it  M= make a mental note  2.   You can keep a Social worker.  Use a 3-ring notebook with sections for the following: calendar, important names and phone numbers,  medications, doctors' names/phone  numbers, lists/reminders, and a section to journal what you did  each day.   3.    Use a calendar to write appointments down.  4.    Write yourself a schedule for the day.  This can be placed on the calendar or in a separate section of the Memory Notebook.  Keeping a  regular schedule can help memory.  5.    Use medication organizer with sections for each day or morning/evening pills.  You may need help loading it  6.    Keep a basket, or pegboard by the door.  Place items that you need to take out with you in the basket or on the pegboard.  You may also want to  include a message board for reminders.  7.    Use sticky notes.  Place sticky notes with reminders in a place where the task is performed.  For example: " turn off the  stove" placed by the stove, "lock the door" placed on the door at eye level, " take your medications" on  the bathroom mirror or by the place where you normally take your medications.  8.    Use alarms/timers.  Use while cooking to remind yourself to check on food or as a reminder to take your medicine, or as a  reminder to make a call, or as a reminder to perform another task, etc.

## 2021-10-30 NOTE — Progress Notes (Signed)
[facility]    Provider:  Dr Jaynee Eagles Requesting Provider: Chucky May, MD Primary Care Provider:  Reynold Bowen, MD  CC:  memory loss  HPI:  Theresa Holmes is a 80 y.o. female here as requested by Chucky May, MD for memory disorder. PMHX arthritis, chronic back pain, obesity, GERD, kidney stone, hyper cholesteremia, hyperlipidemia, hypertension, hypothyroidism, shingles, spinal stenosis.  I reviewed Dr. Starleen Arms notes which are very difficult to interpret given that there are handwritten however what I can make out is that Theresa Holmes is 80 years old, no previous psychiatric history presented with sudden onset memory difficulties for the past (cannot really) could not remember how to use the TV remote, struggles to multiply, struggles to give him the day of the week and current year, struggled to recall 3 words, not able to do serial sevens.   Theresa Holmes is here with her husband who provides information. Theresa Holmes saw Dr. Toy Care. Ongoing for a year. Theresa Holmes goes to the kitchen and forgets why Theresa Holmes is in there. Theresa Holmes may remember later. Theresa Holmes feels her short-term memory is affected. If they go to church Theresa Holmes knows every hymn and every song by heart but Theresa Holmes will ask the same question over and over again like when is our granddaugter playing soccer, the remote is the same remote not hard and Theresa Holmes has difficulty using it. Theresa Holmes is irritable about it and when Theresa Holmes is told. Theresa Holmes gave her jewelry on a trip to her husband and Theresa Holmes forgot about an hour later, he had to prove it to her. A cousin had dementia but Theresa Holmes had alcohol problem but no one else, her mother died of 5, father lived to 23s, younger sister is good at 74. Repeating the same questions during the day, not knowing the day or date, husband has always paid the bills, no difficulty driving but Theresa Holmes is having difficulty remembering streets but not getting lost or having accidents, no falls due to imbalance, gait is fine, Theresa Holmes still cooks, no significant stress, no  hx of psychiatric problems. Theresa Holmes is still very social. Theresa Holmes is in a bible study. No depression or anxiety. But Theresa Holmes is afraid of her memory. No significant history of alcohol or drugs, no smoking. No hallucinations or delusions, no tremors. Theresa Holmes has a spinal fusion and sometimes that hurts and wakes her up.   Reviewed notes, labs and imaging from outside physicians, which showed: see above  Review of Systems: Patient complains of symptoms per HPI as well as the following symptoms sciatica. Pertinent negatives and positives per HPI. All others negative.   Social History   Socioeconomic History   Marital status: Married    Spouse name: Not on file   Number of children: Not on file   Years of education: Not on file   Highest education level: Not on file  Occupational History   Not on file  Tobacco Use   Smoking status: Never   Smokeless tobacco: Never   Tobacco comments:    teenager experiment with cigarette  Vaping Use   Vaping Use: Never used  Substance and Sexual Activity   Alcohol use: Yes    Alcohol/week: 1.0 standard drink    Types: 1 Glasses of wine per week   Drug use: No   Sexual activity: Not Currently    Partners: Male    Birth control/protection: Surgical    Comment: TVH  Other Topics Concern   Not on file  Social History Narrative   Not on file  Social Determinants of Health   Financial Resource Strain: Not on file  Food Insecurity: Not on file  Transportation Needs: Not on file  Physical Activity: Not on file  Stress: Not on file  Social Connections: Not on file  Intimate Partner Violence: Not on file    Family History  Problem Relation Age of Onset   Pulmonary embolism Mother        amniotic fluid embolism   Kidney failure Father        complete shut down   Heart failure Father    Breast cancer Maternal Grandmother 80   Alzheimer's disease Cousin     Past Medical History:  Diagnosis Date   Arthritis    Atypical chest pain 10/22/2016   Chronic  back pain    Exogenous obesity    GERD (gastroesophageal reflux disease)    H/O hiatal hernia    History of kidney stones    Hypercholesterolemia    Hyperlipidemia    Hypertension    12/14/2018 pt. 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   Degenerative disc disease, lumbar 12/16/2018   Repair recurrent hiatus hernia and Nissen fundoplication over 56 dilator Feb 2015 01/21/2014   GERD (gastroesophageal reflux disease) 02/04/2012   Pure hypercholesterolemia 10/02/2011   Headache(784.0) 10/02/2011    Past Surgical History:  Procedure Laterality Date   ABDOMINAL HYSTERECTOMY  1985   ANTERIOR LAT LUMBAR FUSION N/A 12/16/2018   Procedure: ANTERIOR LATERAL LUMBAR FUSION L 3-4 with lateral plate;  Surgeon: Melina Schools, MD;  Location: [facility];  Service: Orthopedics;  Laterality: N/A;  3 1/2 hours   APPENDECTOMY     BREAST BIOPSY  9/05   stereotactic   CARPAL TUNNEL RELEASE  12/15   right wrist   CHOLECYSTECTOMY     COMBINED AUGMENTATION MAMMAPLASTY AND ABDOMINOPLASTY  1977   EYE SURGERY  12/05   retinal tear-left   HAMMER TOE SURGERY  2002   HERNIA REPAIR  2004   HIATAL HERNIA REPAIR N/A 01/21/2014   Procedure: LAPAROSCOPIC REPAIR RECURRENT  HIATAL HERNIA REDO NISSEN, upper endoscopy;  Surgeon: Pedro Earls, MD;  Location: [facility];  Service: General;  Laterality: N/A;   KIDNEY STONE SURGERY  1610   NISSEN FUNDOPLICATION     with cholecystectomy   ROTATOR CUFF REPAIR  2001   ROTATOR CUFF REPAIR  1/12    left   ROTATOR CUFF REPAIR  5/13    open   SPINAL FUSION  2005   L4  & L5   THYROIDECTOMY, PARTIAL     TONSILLECTOMY     TONSILLECTOMY AND ADENOIDECTOMY     as a child   UPPER  ENDOSCOPY W/ SCLEROTHERAPY  2000   [facility]    Current Outpatient Medications  Medication Sig Dispense Refill   b complex vitamins tablet Take 1 tablet by mouth daily.     Biotin 10 MG CAPS Take by mouth daily.     Cholecalciferol (VITAMIN D) 50 MCG (2000 UT) CAPS Take by mouth daily.     esomeprazole (NEXIUM) 40 MG capsule Take 40 mg by mouth daily at 12 noon.  levothyroxine (SYNTHROID, LEVOTHROID) 137 MCG tablet Take 137 mcg by mouth daily before breakfast.     mirabegron ER (MYRBETRIQ) 25 MG TB24 tablet Take 1 tablet (25 mg total) by mouth daily. One po qd 30 tablet 1   Misc Natural Products (LUTEIN 20 PO) Take by mouth daily.     Multiple Vitamin (MULTIVITAMIN) tablet Take 1 tablet by mouth daily.     Probiotic TBEC See admin instructions.     VITAMIN A PO Take by mouth daily.     vitamin B-12 (CYANOCOBALAMIN) 100 MCG tablet Take 100 mcg by mouth daily.     Vitamin Mixture (ESTER-C PO) Take by mouth daily.     Zinc Sulfate (ZINC 15 PO) Take by mouth daily.     No current facility-administered medications for this visit.    Allergies as of 10/30/2021   (No Known Allergies)    Vitals: BP 121/71   Pulse 67   Ht 5\' 2"  (1.575 m)   Wt 168 lb 6.4 oz (76.4 kg)   LMP 11/26/1983   BMI 30.80 kg/m  Last Weight:  Wt Readings from Last 1 Encounters:  10/30/21 168 lb 6.4 oz (76.4 kg)   Last Height:   Ht Readings from Last 1 Encounters:  10/30/21 5\' 2"  (1.575 m)     Physical exam: Exam: Gen: NAD, conversant, well nourised, obese, well groomed                     CV: RRR, no MRG. No Carotid Bruits. No peripheral edema, warm, nontender Eyes: Conjunctivae clear without exudates or hemorrhage  Neuro: Detailed Neurologic Exam  Speech:    Speech is normal; fluent and spontaneous with normal comprehension.  Cognition:    The patient is oriented to person, place, and time;     recent and remote memory intact;     language fluent;     normal attention,  concentration,     fund of knowledge Cranial Nerves:  MMSE - Mini Mental State Exam 10/30/2021  Orientation to time 4  Orientation to Place 5  Registration 3  Attention/ Calculation 2  Recall 1  Language- name 2 objects 2  Language- repeat 1  Language- follow 3 step command 3  Language- read & follow direction 1  Write a sentence 1  Copy design 1  Total score 24       The pupils are equal, round, and reactive to light. The fundi are flat. Visual fields are full to finger confrontation. Extraocular movements are intact. Trigeminal sensation is intact and the muscles of mastication are normal. The face is symmetric. The palate elevates in the midline. Hearing intact. Voice is normal. Shoulder shrug is normal. The tongue has normal motion without fasciculations.   Coordination:    Normal finger to nose    Gait:    Not atxic or dysmetric  Motor Observation:    No asymmetry, no atrophy, and no involuntary movements noted. Tone:    Normal muscle tone.    Posture:    Posture is normal. normal erect    Strength: chronic left prox arm weakness otherwise strength is V/V in the upper and lower limbs.      Sensation: intact to LT     Reflex Exam:  DTR's:    Deep tendon reflexes in the upper and lower extremities are symmetrical bilaterally.   Toes:    The toes are downgoing bilaterally.   Clonus:    Clonus is absent.  Assessment/Plan:  60 80 y.o. female here with her husband, who also provides information, as requested by Chucky May, MD for memory disorder. PMHX arthritis, chronic back pain, obesity, GERD, kidney stone, hyper cholesteremia, hyperlipidemia, hypertension, hypothyroidism, shingles, spinal stenosis.  Here for memory concerns.  MMSE 24 out of 30.  We had a long discussion today about dementia in general, the most common cause of dementia later in life is Alzheimer's but there are multiple other reasons someone could be having memory loss.  Patient does not  have a history of a first-degree relative with Alzheimer's.  Theresa Holmes seems to have quite good insight and judgment.  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  However I think Theresa Holmes needs a work-up given her complaints, her memory loss especially short-term.  Blood work today for reversible causes of memory loss MRI brain w/wo for reversible causes of dementia Formal memory testing: Dr. Sima Matas for declining cognition Wakes frequently, fragmented sleep, wakes up with dry mouth: Sleep evaluation We discussed Aricept, but at this point we will wait and see what work-up shows.   Orders Placed This Encounter  Procedures   MR BRAIN W WO CONTRAST   B12 and Folate Panel   Methylmalonic acid, serum   Vitamin B1   RPR   Homocysteine   Ammonia   Vitamin D, 25-hydroxy   Comprehensive metabolic panel   CBC with Differential/Platelets   Hemoglobin A1c   Ambulatory referral to Neuropsychology   Ambulatory referral to Sleep Studies      Cc: Chucky May, MD,  Reynold Bowen, MD  Sarina Ill, MD  [facility] [address] [place], [place] [address]  Phone [phone] Fax [phone]  I spent over 60 minutes of face-to-face and non-face-to-face time with patient on the  1. Short-term memory loss   2. Memory loss   3. FHx: dementia   4. screen for Vitamin D deficiency   5. screen for B12 deficiency   6. screen for Elevated glucose   7. Sleep disorder    diagnosis.  This included previsit chart review, lab review, study review, order entry, electronic health record documentation, patient education on the different diagnostic and therapeutic options, counseling and coordination of care, risks and benefits of management, compliance, or risk factor reduction

## 2021-11-01 ENCOUNTER — Telehealth: Payer: Self-pay | Admitting: Neurology

## 2021-11-02 ENCOUNTER — Encounter: Payer: Self-pay | Admitting: Psychology

## 2021-11-03 LAB — COMPREHENSIVE METABOLIC PANEL
ALT: 7 IU/L (ref 0–32)
AST: 10 IU/L (ref 0–40)
Albumin/Globulin Ratio: 1.8 (ref 1.2–2.2)
Albumin: 4.5 g/dL (ref 3.7–4.7)
Alkaline Phosphatase: 90 IU/L (ref 44–121)
BUN/Creatinine Ratio: 23 (ref 12–28)
BUN: 19 mg/dL (ref 8–27)
Bilirubin Total: 1.9 mg/dL — ABNORMAL HIGH (ref 0.0–1.2)
CO2: 28 mmol/L (ref 20–29)
Calcium: 9.2 mg/dL (ref 8.7–10.3)
Chloride: 102 mmol/L (ref 96–106)
Creatinine, Ser: 0.84 mg/dL (ref 0.57–1.00)
Globulin, Total: 2.5 g/dL (ref 1.5–4.5)
Glucose: 85 mg/dL (ref 70–99)
Potassium: 4.4 mmol/L (ref 3.5–5.2)
Sodium: 143 mmol/L (ref 134–144)
Total Protein: 7 g/dL (ref 6.0–8.5)
eGFR: 71 mL/min/{1.73_m2} (ref >59)

## 2021-11-03 LAB — HEMOGLOBIN A1C
Est. average glucose Bld gHb Est-mCnc: 117 mg/dL
Hgb A1c MFr Bld: 5.7 % — ABNORMAL HIGH (ref 4.8–5.6)

## 2021-11-03 LAB — CBC WITH DIFFERENTIAL/PLATELET
Basophils Absolute: 0 10*3/uL (ref 0.0–0.2)
Basos: 1 %
EOS (ABSOLUTE): 0.1 10*3/uL (ref 0.0–0.4)
Eos: 2 %
Hematocrit: 41.8 % (ref 34.0–46.6)
Hemoglobin: 13 g/dL (ref 11.1–15.9)
Immature Grans (Abs): 0 10*3/uL (ref 0.0–0.1)
Immature Granulocytes: 1 %
Lymphocytes Absolute: 2.4 10*3/uL (ref 0.7–3.1)
Lymphs: 39 %
MCH: 25 pg — ABNORMAL LOW (ref 26.6–33.0)
MCHC: 31.1 g/dL — ABNORMAL LOW (ref 31.5–35.7)
MCV: 81 fL (ref 79–97)
Monocytes Absolute: 0.7 10*3/uL (ref 0.1–0.9)
Monocytes: 12 %
Neutrophils Absolute: 2.9 10*3/uL (ref 1.4–7.0)
Neutrophils: 45 %
Platelets: 382 10*3/uL (ref 150–450)
RBC: 5.19 x10E6/uL (ref 3.77–5.28)
RDW: 16.1 % — ABNORMAL HIGH (ref 11.7–15.4)
WBC: 6.2 10*3/uL (ref 3.4–10.8)

## 2021-11-03 LAB — VITAMIN D 25 HYDROXY (VIT D DEFICIENCY, FRACTURES): Vit D, 25-Hydroxy: 50.4 ng/mL (ref 30.0–100.0)

## 2021-11-03 LAB — VITAMIN B1: Thiamine: 148.3 nmol/L (ref 66.5–200.0)

## 2021-11-03 LAB — B12 AND FOLATE PANEL
Folate: 11.6 ng/mL (ref >3.0)
Vitamin B-12: 1845 pg/mL — ABNORMAL HIGH (ref 232–1245)

## 2021-11-03 LAB — RPR: RPR Ser Ql: NONREACTIVE

## 2021-11-03 LAB — AMMONIA: Ammonia: 25 ug/dL — ABNORMAL LOW (ref 31–169)

## 2021-11-03 LAB — METHYLMALONIC ACID, SERUM: Methylmalonic Acid: 163 nmol/L (ref 0–378)

## 2021-11-03 LAB — HOMOCYSTEINE: Homocysteine: 14.9 umol/L (ref 0.0–19.2)

## 2021-11-12 ENCOUNTER — Other Ambulatory Visit: Payer: Self-pay

## 2021-11-12 ENCOUNTER — Ambulatory Visit
Admission: RE | Admit: 2021-11-12 | Discharge: 2021-11-12 | Disposition: A | Discharge status: Home / Self Care | Payer: No Typology Code available for payment source | Source: Ambulatory Visit | Attending: Neurology | Admitting: Neurology

## 2021-11-12 DIAGNOSIS — R413 Other amnesia: Secondary | ICD-10-CM | POA: Diagnosis not present

## 2021-11-12 MED ORDER — GADOBENATE DIMEGLUMINE 529 MG/ML IV SOLN
15.0000 mL | Freq: Once | INTRAVENOUS | Status: AC | PRN
  Administered 2021-11-12: 17:00:00 15 mL via INTRAVENOUS

## 2021-12-03 ENCOUNTER — Other Ambulatory Visit (HOSPITAL_BASED_OUTPATIENT_CLINIC_OR_DEPARTMENT_OTHER): Payer: Self-pay | Admitting: *Deleted

## 2021-12-03 DIAGNOSIS — N3946 Mixed incontinence: Secondary | ICD-10-CM

## 2021-12-03 MED ORDER — MIRABEGRON ER 25 MG PO TB24: 25.0000 mg | ORAL_TABLET | Freq: Every day | ORAL | 1 refills | Status: DC

## 2022-02-19 ENCOUNTER — Encounter (HOSPITAL_BASED_OUTPATIENT_CLINIC_OR_DEPARTMENT_OTHER): Payer: Self-pay | Admitting: *Deleted

## 2022-03-13 ENCOUNTER — Encounter: Payer: Self-pay | Admitting: Psychology

## 2022-03-13 ENCOUNTER — Encounter: Payer: No Typology Code available for payment source | Attending: Psychology | Admitting: Psychology

## 2022-03-13 DIAGNOSIS — F411 Generalized anxiety disorder: Secondary | ICD-10-CM | POA: Insufficient documentation

## 2022-03-13 DIAGNOSIS — G479 Sleep disorder, unspecified: Secondary | ICD-10-CM | POA: Insufficient documentation

## 2022-03-13 DIAGNOSIS — R413 Other amnesia: Secondary | ICD-10-CM | POA: Diagnosis present

## 2022-03-13 NOTE — Progress Notes (Signed)
Neuropsychological Consultation ? ? ?Patient:   Theresa Holmes  ? ?DOB:   07/06/1941 ? ?MR Number:  [id number] ? ?Location:  [facility] ?[place] [place] ?[id number] [facility] ?[place] [address] ?Dept: [phone] ?          ?Date of Service:   03/13/2022 ? ?Start Time:   2 PM ?End Time:   4 PM ? ?Today's visit was an in person visit that was conducted in my outpatient clinic office.  The patient myself were present for this clinical interview.  1 hour and 15 minutes was spent in face-to-face clinical interview and the other 45 minutes were spent with record review, report writing and setting up testing protocols. ? ?Provider/Observer:  Theresa Holmes, Psy.D.   ?    Clinical Neuropsychologist ?     ? ?Billing Code/Service: [id number]/[id number] ? ?Chief Complaint:    Theresa Holmes is an 81 year old female who is referred for neuropsychological evaluation by her treating neurologist Theresa Ill, MD as part of a larger neurological work-up.  The patient had originally been seen through psychiatry with Theresa May, MD because of issues related to anxiety and stress.  The patient has a past medical history including arthritis, chronic back pain, GERD, kidney stones, hypercholesteremia, hyperlipidemia, hypertension, hypothyroidism, shingles and spinal stenosis with chronic pain.  The patient had no previous psychiatric history but presented to Theresa Holmes with concerns around sudden onset memory difficulties particularly for short-term memory such as where she is placed various objects, changes in cognitive efficiency and performance and changes in mental status/cognition. ? ?Reason for Service:  Theresa Holmes is an 81 year old female who is referred for neuropsychological evaluation by her treating neurologist Theresa Ill, MD as part of a larger neurological work-up.  The patient had originally been seen through  psychiatry with Theresa May, MD because of issues related to anxiety and stress.  The patient has a past medical history including arthritis, chronic back pain, GERD, kidney stones, hypercholesteremia, hyperlipidemia, hypertension, hypothyroidism, shingles and spinal stenosis with chronic pain.  The patient had no previous psychiatric history but presented to Theresa Holmes with concerns around sudden onset memory difficulties particularly for short-term memory such as where she is placed various objects, changes in cognitive efficiency and performance and changes in mental status/cognition. ? ?While the patient's husband was not present for the clinical interview today he was present during the clinical interview with Theresa Holmes.  Patient has been described as having issues such as going to the kitchen and forgetting why she has gone there but often remembers these types of information later.  She is described as asking the same question over and over again about episodic events in her life such as what their daily schedule involves.  The patient was described as being increasingly irritable particularly when issues related to her forgetting specific episodic and explicit events.  The patient has not had any difficulty driving but sometimes difficulty remembering where she is intending to go but she has not gotten lost or been confused with geographic orientation.  There have been no accidents.  The patient is not displaying any gait changes or fine motor control and has no indications of tremors.  The patient retains excellent social skills and denies any significant depression or anxiety but she is admitting increased worry about her memory difficulties and describes being increasingly fixated on her memory during the clinical interview today.  There are  no indications of hallucinations or delusions.  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 ? ?During the clinical interview today the patient reports that she for started noticing minor issues 2 or 3 years ago but it has been getting progressively worsening.  She reports that there is been an increase in her worry about this.  The patient reports that she has been remembering important things but will constantly lose things around the house such as her remote, phone or other items.  The patient described no change in geographic orientation.  She reports that she has been having more difficulty learning and comprehending new technological issues such as learning details about a new car she has or how to operate her smart phone effectively.  The patient reports that they moved into a new house 1 and half years ago that was smaller and she has had difficulty adjusting to changes in appliances and other things.  The patient describes her self is more forgetful but that her memory difficulties are primarily on new learning.  She acknowledges difficulty recalling what year it is and has had these difficulties both during mental status exams with her physicians as well as reporting difficulty remembering today while she was in our waiting room knowing that I would ask.  However, she tends to remember quite vividly some of the events where she did have difficulty remembering things.  The patient also describes word finding difficulties and she had several times where she had difficulty remembering the name of a specific location such as nearby towns. ? ?When going into more detail around memory difficulties the patient reports that cueing or other issues often help her memory considerably.  The patient reports that when she does not remember things most the time is somebody suggest an answer that she will immediately know whether is correct or not.  She does acknowledge at least a few times where she completely forgot conversations that it occurred.  When the patient had difficulty  remembering the name of a nearby city [employer] [place]) as I went through a list of potential candidates the patient immediately identified [place] is the correct answer.   ? ?The patient reports that other than 1 first cousin there have been no family members with any type of progressive dementia and while her mother died in her 72s her father lived into his 1s and was in good health.  The patient's younger sister is 75 years old and in good health. ? ?The patient reports that she does have some issues sleeping and it will take her a while to fall asleep at night and she can be woken up with pain issues.  She reports that sometimes she sleeps through the night and other times she does not.  She denies any significant issues with snoring etc. ? ?The patient has had recent blood work including work-up for vitamin D, B1, B12 and folate panel.  Blood work was all generally within normal limits with some mild elevation in blood glucose levels. ? ?The patient had an MRI conducted on 11/12/2021 with the impression provided by Andrey Spearman, MD as unremarkable MRI brain with and without contrast with no acute findings.  There was mild periventricular subcortical foci of nonspecific T2 hyperintensities but no lesions or other significant findings. ? ? ?Behavioral Observation: MAKYNLIE ROSSINI  presents as a 81 y.o.-year-old Right handed Caucasian Female who appeared  her stated age. her dress was Appropriate and she was Well Groomed and her manners were Appropriate to the situation.  her participation was indicative of Appropriate and Redirectable behaviors.  There were not physical disabilities noted.  she displayed an appropriate level of cooperation and motivation.   ? ? ?Interactions:    Active Appropriate and Redirectable ? ?Attention:   abnormal and attention span appeared shorter than expected for age ? ?Memory:   abnormal; remote memory intact, recent memory impaired ? ?Visuo-spatial:  not examined ? ?Speech  (Volume):  normal ? ?Speech:   normal; some word finding issues noted but improved or corrected with cueing ? ?Thought Process:  Coherent and Relevant ? ?Though Content:  WNL; not suicidal and not homicidal ? ?Or

## 2022-04-24 ENCOUNTER — Ambulatory Visit: Payer: No Typology Code available for payment source | Admitting: Psychology

## 2022-05-02 ENCOUNTER — Ambulatory Visit: Payer: No Typology Code available for payment source

## 2022-05-06 ENCOUNTER — Encounter: Payer: No Typology Code available for payment source | Attending: Psychology

## 2022-05-06 DIAGNOSIS — M5136 Other intervertebral disc degeneration, lumbar region: Secondary | ICD-10-CM | POA: Insufficient documentation

## 2022-05-06 DIAGNOSIS — G479 Sleep disorder, unspecified: Secondary | ICD-10-CM | POA: Diagnosis present

## 2022-05-06 DIAGNOSIS — G894 Chronic pain syndrome: Secondary | ICD-10-CM | POA: Insufficient documentation

## 2022-05-06 DIAGNOSIS — R41840 Attention and concentration deficit: Secondary | ICD-10-CM | POA: Insufficient documentation

## 2022-05-06 DIAGNOSIS — R413 Other amnesia: Secondary | ICD-10-CM | POA: Insufficient documentation

## 2022-05-06 DIAGNOSIS — F411 Generalized anxiety disorder: Secondary | ICD-10-CM | POA: Insufficient documentation

## 2022-05-06 NOTE — Progress Notes (Signed)
Behavioral Observations The patient appeared well-groomed and appropriately dressed. The patient wore a neck brace and her gait was somewhat unsteady. Her manners were polite and appropriate to the situation. Her attitude toward testing was positive and she showed good effort. The patient required frequent repetition and had difficulty understanding instructions. Some word finding difficulty was observed.   Neuropsychology Note  Theresa Holmes completed 180 minutes of neuropsychological testing with technician, Dina Rich, BA, under the supervision of Ilean Skill, PsyD., Clinical Neuropsychologist. The patient did not appear overtly distressed by the testing session, per behavioral observation or via self-report to the technician. Rest breaks were offered.   Clinical Decision Making: In considering the patient's current level of functioning, level of presumed impairment, nature of symptoms, emotional and behavioral responses during clinical interview, level of literacy, and observed level of motivation/effort, a battery of tests was selected by Dr. Sima Matas during initial consultation on 03/13/2022. This was communicated to the technician. Communication between the neuropsychologist and technician was ongoing throughout the testing session and changes were made as deemed necessary based on patient performance on testing, technician observations and additional pertinent factors such as those listed above.  Tests Administered: Controlled Oral Word Association Test (COWAT; FAS & Animals)  Wechsler Adult Intelligence Scale, 4th Edition (WAIS-IV) Wechsler Memory Scale, 4th Edition (WMS-IV); Older Adult Battery   Results:  COWAT FAS Total: 10 Z = -2.41 Animals Total: 10 Z = -1.47      WAIS-IV   Composite Score Summary  Scale Sum of Scaled Scores Composite Score Percentile Rank 95% Conf. Interval Qualitative Description  Verbal Comprehension 27 VCI 95 37 90-101 Average   Perceptual Reasoning 31 PRI 102 55 96-108 Average  Working Memory 12 WMI 77 6 72-85 Borderline  Processing Speed 14 PSI 84 14 77-94 Low Average  Full Scale 84 FSIQ 89 23 85-93 Low Average  General Ability 58 GAI 98 45 93-103 Average      Verbal Comprehension Subtests Summary  Subtest Raw Score Scaled Score Percentile Rank Reference Group Scaled Score SEM  Similarities 20 9 37 7 0.90  Vocabulary 31 9 37 9 0.73  Information 10 9 37 8 0.73  (Comprehension) 20 10 50 8 1.08       Perceptual Reasoning Subtests Summary  Subtest Raw Score Scaled Score Percentile Rank Reference Group Scaled Score SEM  Block Design 24 10 50 6 1.34  Matrix Reasoning 12 12 75 6 1.12  Visual Puzzles 8 9 37 6 1.27  (Picture Completion) '3 5 5 2 '$ 1.24       Working Doctor, general practice Raw Score Scaled Score Percentile Rank Reference Group Scaled Score SEM  Digit Span '14 5 5 3 '$ 0.85  Arithmetic '8 7 16 5 '$ 0.99       Processing Speed Subtests Summary  Subtest Raw Score Scaled Score Percentile Rank Reference Group Scaled Score SEM  Symbol Search '9 6 9 2 '$ 1.12  Coding '28 8 25 3 '$ 1.12       WMS-IV  Index Score Summary  Index Sum of Scaled Scores Index Score Percentile Rank 95% Confidence Interval Qualitative Descriptor  Auditory Memory (AMI) 18 67 1 62-75 Extremely Low  Visual Memory (VMI) 10 73 4 69-79 Borderline  Immediate Memory (IMI) 15 69 2 64-77 Extremely Low  Delayed Memory (DMI) 13 65 1 60-75 Extremely Low      Primary Subtest Scaled Score Summary  Subtest Domain Raw Score Scaled Score Percentile Rank  Logical Memory I AM  $'17 6 9  'c$ Logical Memory II AM '5 6 9  '$ Verbal Paired Associates I AM 1 2 0.4  Verbal Paired Associates II AM '1 4 2  '$ Visual Reproduction I VM '17 7 16  '$ Visual Reproduction II VM 0 3 1  Symbol Span VWM '6 6 9        '$ Auditory Memory Process Score Summary  Process Score Raw Score Scaled Score Percentile Rank Cumulative Percentage (Base Rate)  LM II  Recognition 13 - - 3-9%  VPA II Recognition 16 - - 3-9%       Visual Memory Process Score Summary  Process Score Raw Score Scaled Score Percentile Rank Cumulative Percentage (Base Rate)  VR II Recognition 1 - - 3-9%      ABILITY-MEMORY ANALYSIS  Ability Score:  GAI: 98 Date of Testing:  WAIS-IV; WMS-IV 2022/05/06  Predicted Difference Method   Index Predicted WMS-IV Index Score Actual WMS-IV Index Score Difference Critical Value  Significant Difference Y/N Base Rate  Auditory Memory 99 67 32 9.33 Y <1%  Visual Memory 99 73 26 7.72 Y 2%  Immediate Memory 99 69 30 10.41 Y <1%  Delayed Memory 99 65 34 10.86 Y <1%  Statistical significance (critical value) at the .01 level.        Feedback to Patient: Theresa Holmes will return on 09/05/2022 for an interactive feedback session with Dr. Sima Matas at which time her test performances, clinical impressions and treatment recommendations will be reviewed in detail. The patient understands she can contact our office should she require our assistance before this time.  180 minutes spent face-to-face with patient administering standardized tests, 30 minutes spent scoring Environmental education officer). [CPT [id number], [id number]]  Full report to follow.

## 2022-05-07 ENCOUNTER — Encounter (HOSPITAL_BASED_OUTPATIENT_CLINIC_OR_DEPARTMENT_OTHER): Payer: No Typology Code available for payment source | Admitting: Psychology

## 2022-05-07 ENCOUNTER — Encounter: Payer: Self-pay | Admitting: Psychology

## 2022-05-07 DIAGNOSIS — M5136 Other intervertebral disc degeneration, lumbar region: Secondary | ICD-10-CM

## 2022-05-07 DIAGNOSIS — R413 Other amnesia: Secondary | ICD-10-CM

## 2022-05-07 DIAGNOSIS — G479 Sleep disorder, unspecified: Secondary | ICD-10-CM | POA: Diagnosis not present

## 2022-05-07 DIAGNOSIS — G894 Chronic pain syndrome: Secondary | ICD-10-CM

## 2022-05-07 DIAGNOSIS — R41840 Attention and concentration deficit: Secondary | ICD-10-CM

## 2022-05-07 DIAGNOSIS — F411 Generalized anxiety disorder: Secondary | ICD-10-CM

## 2022-05-07 NOTE — Progress Notes (Signed)
Neuropsychological Evaluation   Patient:  Theresa Holmes   DOB: Jan 02, 1941  MR Number: [id number]  Location: [facility] [place], [place] [id number] [place] [id number] Dept: [phone]  Start: 11 AM End: 12 PM  Provider/Observer:     Theresa Roys PsyD  Chief Complaint:      Chief Complaint  Patient presents with   Memory Loss   Anxiety   Other    Word finding difficulties    Reason For Service:      Theresa Holmes is an 81 year old female who is referred for neuropsychological evaluation by her treating neurologist Theresa Ill, MD as part of a larger neurological work-up.  The patient had originally been seen through psychiatry with Theresa May, MD because of issues related to anxiety and stress.  The patient has a past medical history including arthritis, chronic back pain, GERD, kidney stones, hypercholesteremia, hyperlipidemia, hypertension, hypothyroidism, shingles and spinal stenosis with chronic pain.  The patient had no previous psychiatric history but presented to Theresa Holmes with concerns around sudden onset memory difficulties particularly for short-term memory such as where she is placed various objects, changes in cognitive efficiency and performance and changes in mental status/cognition.  While the patient's husband was not present for the clinical interview today, he was present during the clinical interview with Theresa Holmes.  Patient has been described as having issues such as going to the kitchen and forgetting why she has gone there but often remembers these types of information later.  She is described as asking the same question over and over again about episodic events in her life such as what their daily schedule involves.  The patient was described as being increasingly irritable particularly when issues related to her forgetting specific episodic  and explicit events.  The patient has not had any difficulty driving but sometimes difficulty remembering where she is intending to go but she has not gotten lost or been confused with geographic orientation.  There have been no accidents.  The patient is not displaying any gait changes or fine motor control and has no indications of tremors.  The patient retains excellent social skills and denies any significant depression or anxiety but she is admitting increased worry about her memory difficulties and describes being increasingly fixated on her memory during the clinical interview today.  There are no indications of hallucinations or delusions.  The patient does report that she continues to have pain from her degenerative disc disease/spinal fusion and that her pain will wake her up at night.  During the clinical interview today, the patient reports that she first started noticing minor issues 2 or 3 years ago but it has been getting progressively worsening.  She reports that there is been an increase in her worry about this.  The patient reports that she has been remembering important things but will constantly lose things around the house such as her remote, phone or other items.  The patient described no change in geographic orientation.  She reports that she has been having more difficulty learning and comprehending new technological issues such as learning details about a new car she has or how to operate her smart phone effectively.  The patient reports that they moved into a new house 1 and half years ago that was smaller and she has had difficulty adjusting to changes in appliances and other things.  The patient describes herself as  more forgetful but that her memory difficulties are primarily related to new learning.  She acknowledges difficulty recalling what year it is and has had these difficulties both during mental status exams with her physicians as well as reporting difficulty remembering today,  while she was in our waiting room knowing that I would ask.  However, she tends to remember quite vividly some of the events where she did have difficulty remembering things.  The patient also describes word finding difficulties and she had several times where she had difficulty remembering the name of a specific location such as nearby towns.  When going into more detail around memory difficulties, the patient reports that cueing or other issues often help her memory considerably.  The patient reports that when she does not remember things most the time is somebody suggest an answer that she will immediately know whether is correct or not.  She does acknowledge at least a few times where she completely forgot conversations that had occurred.  When the patient had difficulty remembering the name of a nearby city [place]) as I went through a list of potential candidates the patient immediately identified [place] is the correct answer.    The patient reports that other than 1 first cousin there have been no family members with any type of progressive dementia and while her mother died in her 20s her father lived into his 63s and was in good health.  The patient's younger sister is 41 years old and in good health.  The patient reports that she does have some issues sleeping and it will take her a while to fall asleep at night and she can be woken up with pain issues.  She reports that sometimes she sleeps through the night and other times she does not.  She denies any significant issues with snoring etc.  The patient has had recent blood work including work-up for vitamin D, B1, B12 and folate panel.  Blood work was all generally within normal limits with some mild elevation in blood glucose levels.  The patient had an MRI conducted on 11/12/2021 with the impression provided by Andrey Spearman, MD as unremarkable MRI brain with and without contrast with no acute findings.  There was mild  periventricular subcortical foci of nonspecific T2 hyperintensities but no lesions or other significant findings.  Tests Administered: Controlled Oral Word Association Test (COWAT; FAS & Animals)  Wechsler Adult Intelligence Scale, 4th Edition (WAIS-IV) Wechsler Memory Scale, 4th Edition (WMS-IV); Older Adult Battery   Participation Level:   Active  Participation Quality:  Appropriate      Behavioral Observation:  The patient appeared well-groomed and appropriately dressed. The patient wore a neck brace and her gait was somewhat unsteady. Her manners were polite and appropriate to the situation. Her attitude toward testing was positive and she showed good effort. The patient required frequent repetition and had difficulty understanding instructions. Some word finding difficulty was observed.  Well Groomed, Alert, and Appropriate.   Test Results:   Initially, an estimation was made as to the patient's premorbid intellectual and cognitive functioning to provide a comparison point for current achieved cognitive variables.  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  The patient graduated from college with a nursing degree reporting that she maintained a B average throughout school.  It is estimated that the patient likely function and in the average to high average range relative to normative population and we will  use a conservative estimate of operating in the upper end of the average range for comparison purposes although the patient likely performed higher than that in various individual subtest and specific domains.  COWAT FAS Total: 10 Z = -2.41 Animals Total: 10 Z = -1.47     The patient has reported difficulties with word finding and remembering names of people and places.  To objectively assess for expressive language and verbal fluency components the patient was administered the controlled oral Word Association test.  On the FAS  test which assesses lexical fluency the patient was only able to generate 10 total words within the time limits applied.  This is more than 2 standard deviations below her normative comparison group and does suggest difficulties with free recall and lexical fluency.  The patient also showed weakness with regard to semantic fluency also only producing 10 words within 60 seconds allotted timeframe.  This is roughly 1-1/2 standard deviation below normative patterns and does suggest some difficulty with rapidly recalling and reporting targeted words either related to lexical fluency or semantic fluency.     WAIS-IV               Composite Score Summary         Scale Sum of Scaled Scores Composite Score Percentile Rank 95% Conf. Interval Qualitative Description  Verbal Comprehension 27 VCI 95 37 90-101 Average  Perceptual Reasoning 31 PRI 102 55 96-108 Average  Working Memory 12 WMI 77 6 72-85 Borderline  Processing Speed 14 PSI 84 14 77-94 Low Average  Full Scale 84 FSIQ 89 23 85-93 Low Average  General Ability 58 GAI 98 45 93-103 Average    The patient was then administered the Wechsler Adult Intelligence Scale-IV to provide an objective assessment of a wide range of cognitive domains.  This is not specifically used to identify her lifelong intellectual ability as the patient is describing changes in cognitive functioning.  2 composite scores were calculated to locate global cognitive functioning.  The patient produced a full-scale IQ score of 89 which falls at the 23rd percentile and is in the low average range relative to a normative population and is slightly below predicted levels.  We also calculated the patient's general abilities index score which places less emphasis on items that are often most acutely affected to cognitive change.  These include working memory/auditory encoding and information processing speed variables.  In fact, these were her 2 lowest areas of functioning within these  cognitive domains.  The patient produced a general abilities index score of 98 which falls at the 45th percentile and is in the average range relative to normative population and generally consistent with predicted levels of premorbid functioning.  It is clear that the patient's primary cognitive deficits in this broad battery of assessment tools had to do with her working memory specifically auditory encoding capacity as well as her information processing speed and focus execute abilities.             Verbal Comprehension Subtests Summary     Subtest Raw Score Scaled Score Percentile Rank Reference Group Scaled Score SEM  Similarities 20 9 37 7 0.90  Vocabulary 31 9 37 9 0.73  Information 10 9 37 8 0.73  (Comprehension) 20 10 50 8 1.08      The patient produced a verbal comprehension index score of 95 which falls at the 37th percentile and is in the average range relative to a normative population.  There was little variability  within subtest performance with all of them falling in the average range relative to a normative population and generally consistent with predictions of premorbid functioning.  The patient did well on measures of verbal reasoning and problem-solving, vocabulary knowledge, general fund of information and social judgment comprehension variables.             Perceptual Reasoning Subtests Summary      Subtest Raw Score Scaled Score Percentile Rank Reference Group Scaled Score SEM  Block Design 24 10 50 6 1.34  Matrix Reasoning 12 12 75 6 1.12  Visual Puzzles 8 9 37 6 1.27  (Picture Completion) '3 5 5 2 '$ 1.24      The patient produced a perceptual reasoning index score of 102 which falls at the 55th percentile and is in the average range.  This is within predicted levels of premorbid functioning.  The patient did well on the 3 primary subtest of visual analysis and organization visual reasoning and problem-solving and visual estimation and predicted capacity.  She did have  specific deficits with regard to her ability to identify visual anomalies within a visual gestalt but this was 1 individual subtest and does not likely represent a specific deficit beyond difficulties with this 1 very specific ability.             Working Hydrographic surveyor Raw Score Scaled Score Percentile Rank Reference Group Scaled Score SEM  Digit Span '14 5 5 3 '$ 0.85  Arithmetic '8 7 16 5 '$ 0.99      The patient produced a working memory index score of 77 which falls at the 6th percentile relative to a normative population.  This is a significantly impaired score and well below predicted levels of premorbid functioning.  The patient had difficulties with primary ability to encode auditory information and did show some relative improvement but still impaired capacity to actively process information in her auditory register.             Processing speed      Subtest Raw Score Scaled Score Percentile Rank Reference Group Scaled Score SEM  Symbol Search '9 6 9 2 '$ 1.12  Coding '28 8 25 3 '$ 1.12      The patient produced a processing speed index score of 84 which falls at the 14th percentile and low average range.  This is below predicted levels of premorbid functioning suggesting some difficulties with focus execute abilities, information processing speed specifically related to visual scanning, visual searching and overall speed of mental operations.     WMS-IV           Index Score Summary        Index Sum of Scaled Scores Index Score Percentile Rank 95% Confidence Interval Qualitative Descriptor  Auditory Memory (AMI) 18 67 1 62-75 Extremely Low  Visual Memory (VMI) 10 73 4 69-79 Borderline  Immediate Memory (IMI) 15 69 2 64-77 Extremely Low  Delayed Memory (DMI) 13 65 1 60-75 Extremely Low    We then completed the Wechsler Memory Scale-IV to provide an assessment of multiple domains of memory and learning.  On the Wechsler Adult Intelligence Scale the patient showed  specific weaknesses with regard to auditory encoding capacity and her ability to actively process information in her auditory register.  The patient showed similar weaknesses with regard to visual encoding as assessed by the symbol span subtest of the [place].  This pattern would suggest difficulty consistently for both auditory and  visual encoding capacities.  Breaking memory functions down between auditory versus visual memory the patient showed consistent weaknesses for both auditory and visual memory.  The patient produced an auditory memory index score of 67 which falls in the 1st percentile and is in the extremely low range relative to normative population.  The patient produced a visual memory index score of 73 which falls at the 4th percentile and is in the borderline range of functioning.  These are both significantly impaired and well below predicted levels of premorbid functioning.  They are also below those with that would be typically expected even with her weak auditory and visual encoding capacity.  Breaking memory functions down between immediate versus delayed memory the patient produced an immediate memory index score of 69 which falls at the 2nd percentile relative to normative population.  She showed similar performance on the delayed memory index score where she produced an index score of 65 falling at the 1st percentile relative to a normative population.  This pattern does suggest that the patient has significant weaknesses with regard to auditory and visual encoding capacity which limit her ability to store, organize information.  She did, however, appeared to retain the information she initially learned over period of delay in her weaknesses and deficits for delayed memory were related to significant immediate memory.  Her performance did not improve under recognition/cued recall formats.  While the patient reported during the clinical interview that she would improve  given time or under cued recall type situations and her day-to-day functioning she did not show particular improvements under cued/recognition formats.            Primary Subtest Scaled Score Summary      Subtest Domain Raw Score Scaled Score Percentile Rank  Logical Memory I AM '17 6 9  '$ Logical Memory II AM '5 6 9  '$ Verbal Paired Associates I AM 1 2 0.4  Verbal Paired Associates II AM '1 4 2  '$ Visual Reproduction I VM '17 7 16  '$ Visual Reproduction II VM 0 3 1  Symbol Span VWM '6 6 9                  '$ Auditory Memory Process Score Summary      Process Score Raw Score Scaled Score Percentile Rank Cumulative Percentage (Base Rate)  LM II Recognition 13 - - 3-9%  VPA II Recognition 16 - - 3-9%                   Visual Memory Process Score Summary      Process Score Raw Score Scaled Score Percentile Rank Cumulative Percentage (Base Rate)  VR II Recognition 1 - - 3-9%      Summary of Results:   Overall, the results of the current neuropsychological evaluation do show some specific attentional deficits.  These fall in the areas of both auditory and visual encoding deficits as well as focus execute abilities having a significant impact on her information processing speed.  The patient's verbal comprehension and verbal reasoning scores, visual-spatial and visual analysis scores were all within normal limits and there were no indications of any changes in executive functioning related to either verbal or visual reasoning and problem-solving, or deficits with visual analysis, visual constructional abilities or visual spatial abilities.  The patient did show some significant and specific deficits with regard to memory and learning.  These are consistent with subjective reports.  However, the patient's consistent deficits with regard to auditory and visual  encoding are likely the primary causative factor for her difficulties storing and organizing new information.  The patient showed consistent  deficits on both measures of auditory and visual encoding capacity and the level of her deficits in this domain would be consistent with the achieved scores on various memory and learning components.  The patient did not improve under recognition and cued recall types of formats and therefore this does not appear to be a primary retrieval deficit but a primary deficit with regard to her encoding capacity and ability to process information and active registers.  Impression/Diagnosis:   Overall, the results of the current neuropsychological evaluation are quite encouraging.  Looking at the patient's subjective reports derived from both the patient and her family members as well as objective neuropsychological testing and available medical information the results of the current neuropsychological evaluation would not be consistent with those typically seen with progressive cortical mediated dementia.  The patient does have 1 specific area of cognitive deficits clearly noted related to attention and concentration primarily related to significant impairments for encoding of either visual or auditory information.  She also shows other attentional deficits related to information processing speed/focus execute abilities.  While these patterns could suggest subcortical types of deficits there were no other findings consistent with other subcortical involvement.  The patient shows no specific motor deficits or the subjective reports of the types of memory and learning deficits consistent with subcortical progressive dementia.  The pattern is clearly inconsistent with symptoms associated with the development of progressive cortical dementia such as Alzheimer's or Lewy body dementia.  The patient has experienced increases in anxiety and worry around difficulties and is likely becoming hypervigilant of any deficits for fear associated to changes with aging.  Some degree of changes in white matter tracts consistent with normal  age-related changes Holmes also play some role in her changes in cognitive functioning but these would be typical age-related changes.  The patient describes ongoing issues with sleep disturbance and chronic pain which would clearly have a negative impact on encoding and information processing speed.  I suspect that the primary difficulties are combination of normal age-related changes and subcortical white matter regions, consistent and persistent sleep difficulties, ongoing chronic pain symptoms and increasing anxiety and worry with increased hypervigilant over any errors or difficulties that she has.  Again, there does not appear to be any pattern consistent with Alzheimer's or other cortically mediated progressive dementia.  As far as treatment recommendations, I would suggest continued focus on any attempts we can make to improve her sleep pattern and manage her chronic pain conditions.  Related to the normal age-related changes and will remain important for the patient to continue with a very healthy dietary pattern with healthy foods such as fresh vegetables and fruits particularly the ones that include eating the peels of the fruit, whole grains and nuts etc.  The pattern should maintain as much physical activity as she can sustain on a day-to-day basis.  Working on sleep hygiene issues would also likely be helpful as well.  I will sit down with the patient and go over the results of the current neuropsychological evaluation with more specific recommendations included during that time.  Diagnosis:    Attention and concentration deficit  Short-term memory loss  Generalized anxiety disorder  Sleeping difficulties  Degenerative disc disease, lumbar  Chronic pain syndrome   _____________________ Ilean Skill, Psy.D. Clinical Neuropsychologist

## 2022-05-13 ENCOUNTER — Encounter (INDEPENDENT_AMBULATORY_CARE_PROVIDER_SITE_OTHER): Payer: No Typology Code available for payment source | Admitting: Ophthalmology

## 2022-05-13 DIAGNOSIS — H43813 Vitreous degeneration, bilateral: Secondary | ICD-10-CM | POA: Diagnosis not present

## 2022-05-13 DIAGNOSIS — I10 Essential (primary) hypertension: Secondary | ICD-10-CM

## 2022-05-13 DIAGNOSIS — H35033 Hypertensive retinopathy, bilateral: Secondary | ICD-10-CM | POA: Diagnosis not present

## 2022-05-13 DIAGNOSIS — H33303 Unspecified retinal break, bilateral: Secondary | ICD-10-CM

## 2022-05-22 ENCOUNTER — Ambulatory Visit: Payer: Self-pay | Admitting: Orthopedic Surgery

## 2022-06-10 NOTE — Pre-Procedure Instructions (Signed)
Surgical Instructions    Your procedure is scheduled on Monday, July 24th.  Report to [facility] Main Entrance "A" at 5:30 A.M., then check in with the Admitting office.  Call this number if you have problems the morning of surgery:  [phone]   If you have any questions prior to your surgery date call [phone]: Open Monday-Friday 8am-4pm    Remember:  Do not eat or drink after midnight the night before your surgery   Take these medicines the morning of surgery with A SIP OF WATER  esomeprazole (NEXIUM)  levothyroxine (SYNTHROID, LEVOTHROID)   As of today, STOP taking any Aspirin (unless otherwise instructed by your surgeon) Aleve, Naproxen, Ibuprofen, Motrin, Advil, Goody's, BC's, all herbal medications, fish oil, and all vitamins.                     Do NOT Smoke (Tobacco/Vaping) for 24 hours prior to your procedure.  If you use a CPAP at night, you may bring your mask/headgear for your overnight stay.   Contacts, glasses, piercing's, hearing aid's, dentures or partials may not be worn into surgery, please bring cases for these belongings.    For patients admitted to the hospital, discharge time will be determined by your treatment team.   Patients discharged the day of surgery will not be allowed to drive home, and someone needs to stay with them for 24 hours.  SURGICAL WAITING ROOM VISITATION Patients having surgery or a procedure may have no more than 2 support people in the waiting area - these visitors may rotate.   Children under the age of 10 must have an adult with them who is not the patient. If the patient needs to stay at the hospital during part of their recovery, the visitor guidelines for inpatient rooms apply. Pre-op nurse will coordinate an appropriate time for 1 support person to accompany patient in pre-op.  This support person may not rotate.   Please refer to the [facility] website for the visitor guidelines for Inpatients (after your surgery is over  and you are in a regular room).    Special instructions:   [facility]- Preparing For Surgery  Before surgery, you can play an important role. Because skin is not sterile, your skin needs to be as free of germs as possible. You can reduce the number of germs on your skin by washing with CHG (chlorahexidine gluconate) Soap before surgery.  CHG is an antiseptic cleaner which kills germs and bonds with the skin to continue killing germs even after washing.    Oral Hygiene is also important to reduce your risk of infection.  Remember - BRUSH YOUR TEETH THE MORNING OF SURGERY WITH YOUR REGULAR TOOTHPASTE  Please do not use if you have an allergy to CHG or antibacterial soaps. If your skin becomes reddened/irritated stop using the CHG.  Do not shave (including legs and underarms) for at least 48 hours prior to first CHG shower. It is OK to shave your face.  Please follow these instructions carefully.   Shower the NIGHT BEFORE SURGERY and the MORNING OF SURGERY  If you chose to wash your hair, wash your hair first as usual with your normal shampoo.  After you shampoo, rinse your hair and body thoroughly to remove the shampoo.  Use CHG Soap as you would any other liquid soap. You can apply CHG directly to the skin and wash gently with a scrungie or a clean washcloth.   Apply the CHG Soap  to your body ONLY FROM THE NECK DOWN.  Do not use on open wounds or open sores. Avoid contact with your eyes, ears, mouth and genitals (private parts). Wash Face and genitals (private parts)  with your normal soap.   Wash thoroughly, paying special attention to the area where your surgery will be performed.  Thoroughly rinse your body with warm water from the neck down.  DO NOT shower/wash with your normal soap after using and rinsing off the CHG Soap.  Pat yourself dry with a CLEAN TOWEL.  Wear CLEAN PAJAMAS to bed the night before surgery  Place CLEAN SHEETS on your bed the night before your  surgery  DO NOT SLEEP WITH PETS.   Day of Surgery: Take a shower with CHG soap. Do not wear jewelry or makeup Do not wear lotions, powders, perfumes, or deodorant. Do not shave 48 hours prior to surgery.  Do not bring valuables to the hospital.  [facility] is not responsible for any belongings or valuables. Do not wear nail polish, gel polish, artificial nails, or any other type of covering on natural nails (fingers and toes) If you have artificial nails or gel coating that need to be removed by a nail salon, please have this removed prior to surgery. Artificial nails or gel coating may interfere with anesthesia's ability to adequately monitor your vital signs. Wear Clean/Comfortable clothing the morning of surgery Remember to brush your teeth WITH YOUR REGULAR TOOTHPASTE.   Please read over the following fact sheets that you were given.    If you received a COVID test during your pre-op visit  it is requested that you wear a mask when out in public, stay away from anyone that may not be feeling well and notify your surgeon if you develop symptoms. If you have been in contact with anyone that has tested positive in the last 10 days please notify you surgeon.

## 2022-06-11 ENCOUNTER — Other Ambulatory Visit: Payer: Self-pay

## 2022-06-11 ENCOUNTER — Encounter (HOSPITAL_COMMUNITY)
Admission: RE | Admit: 2022-06-11 | Discharge: 2022-06-11 | Disposition: A | Discharge status: Home / Self Care | Payer: No Typology Code available for payment source | Source: Ambulatory Visit | Attending: Orthopedic Surgery | Admitting: Orthopedic Surgery

## 2022-06-11 ENCOUNTER — Encounter (HOSPITAL_COMMUNITY): Payer: Self-pay

## 2022-06-11 VITALS — BP 170/91 | HR 108 | Temp 98.2°F | Resp 17 | Ht 61.0 in | Wt 176.2 lb

## 2022-06-11 DIAGNOSIS — M47812 Spondylosis without myelopathy or radiculopathy, cervical region: Secondary | ICD-10-CM | POA: Insufficient documentation

## 2022-06-11 DIAGNOSIS — Z01818 Encounter for other preprocedural examination: Secondary | ICD-10-CM | POA: Insufficient documentation

## 2022-06-11 DIAGNOSIS — I10 Essential (primary) hypertension: Secondary | ICD-10-CM | POA: Insufficient documentation

## 2022-06-11 LAB — BASIC METABOLIC PANEL
Anion gap: 10 (ref 5–15)
BUN: 21 mg/dL (ref 8–23)
CO2: 29 mmol/L (ref 22–32)
Calcium: 8.8 mg/dL — ABNORMAL LOW (ref 8.9–10.3)
Chloride: 103 mmol/L (ref 98–111)
Creatinine, Ser: 0.75 mg/dL (ref 0.44–1.00)
GFR, Estimated: >60 mL/min (ref >60)
Glucose, Bld: 97 mg/dL (ref 70–99)
Potassium: 3.9 mmol/L (ref 3.5–5.1)
Sodium: 142 mmol/L (ref 135–145)

## 2022-06-11 LAB — CBC
HCT: 39.3 % (ref 36.0–46.0)
Hemoglobin: 12.2 g/dL (ref 12.0–15.0)
MCH: 26.2 pg (ref 26.0–34.0)
MCHC: 31 g/dL (ref 30.0–36.0)
MCV: 84.3 fL (ref 80.0–100.0)
Platelets: 357 10*3/uL (ref 150–400)
RBC: 4.66 MIL/uL (ref 3.87–5.11)
RDW: 17.2 % — ABNORMAL HIGH (ref 11.5–15.5)
WBC: 7.5 10*3/uL (ref 4.0–10.5)
nRBC: 0 % (ref 0.0–0.2)

## 2022-06-11 LAB — SURGICAL PCR SCREEN
MRSA, PCR: NEGATIVE
Staphylococcus aureus: NEGATIVE

## 2022-06-11 NOTE — Progress Notes (Signed)
PCP - Dr. Reynold Bowen Cardiologist - denies  PPM/ICD - n/a  Chest x-ray - n/a EKG - 06/11/22 Stress Test - 10/30/16 ECHO - denies Cardiac Cath - denies  Sleep Study - denies CPAP - denies  Blood Thinner Instructions: n/a Aspirin Instructions: n/a  NPO at MD  COVID TEST- n/a  Anesthesia review: n/a  Patient denies shortness of breath, fever, cough and chest pain at PAT appointment   All instructions explained to the patient, with a verbal understanding of the material. Patient agrees to go over the instructions while at home for a better understanding. Patient also instructed to self quarantine after being tested for COVID-19. The opportunity to ask questions was provided.

## 2022-06-12 NOTE — Progress Notes (Signed)
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 (all labs ordered are listed, but only abnormal results are displayed)  Labs Reviewed  CBC - Abnormal; Notable for the following components:      Result Value   RDW 17.2 (*)    All other components within normal limits  BASIC METABOLIC PANEL - Abnormal; Notable for the following components:   Calcium 8.8 (*)    All other components within normal limits  SURGICAL PCR SCREEN    EKG 06/11/22: NSR   CV: Nuclear stress test 10/30/16:  The left ventricular ejection fraction is normal (55-65%). Nuclear stress EF: 61%. There was no ST segment deviation noted during stress. The study is normal. This is a low risk study.   Past Medical History:  Diagnosis Date   Arthritis    Atypical chest pain 10/22/2016   Chronic back pain    Exogenous obesity    GERD (gastroesophageal reflux disease)    H/O hiatal hernia    History of kidney stones    Hypercholesterolemia    Hyperlipidemia    Hypertension    12/14/2018 pt. denies   Hypothyroidism    HX PARTIAL THYROIDECTOMY FOR  NODULE- BENIGN   Shingles 11/2012   Spinal stenosis    Thyroid mass    right, benign   Varicose vein     Past Surgical History:  Procedure Laterality Date   ABDOMINAL HYSTERECTOMY  1985   ANTERIOR LAT LUMBAR FUSION N/A 12/16/2018   Procedure: ANTERIOR  LATERAL LUMBAR FUSION L 3-4 with lateral plate;  Surgeon: Melina Schools, MD;  Location: [facility];  Service: Orthopedics;  Laterality: N/A;  3 1/2 hours   APPENDECTOMY     BREAST BIOPSY  9/05   stereotactic   CARPAL TUNNEL RELEASE  12/15   right wrist   CHOLECYSTECTOMY     COMBINED AUGMENTATION MAMMAPLASTY AND ABDOMINOPLASTY  1977   EYE SURGERY  12/05   retinal tear-left   HAMMER TOE SURGERY  2002   HERNIA REPAIR  2004   HIATAL HERNIA REPAIR N/A 01/21/2014   Procedure: LAPAROSCOPIC REPAIR RECURRENT  HIATAL HERNIA REDO NISSEN, upper endoscopy;  Surgeon: Pedro Earls, MD;  Location: [facility];  Service: General;  Laterality: N/A;   KIDNEY STONE SURGERY  4656   NISSEN FUNDOPLICATION     with cholecystectomy   ROTATOR CUFF REPAIR  2001   ROTATOR CUFF REPAIR  1/12    left   ROTATOR CUFF REPAIR  5/13    open   SPINAL FUSION  2005   L4  & L5   THYROIDECTOMY, PARTIAL     TONSILLECTOMY     TONSILLECTOMY AND ADENOIDECTOMY     as a child   UPPER ENDOSCOPY  W/ SCLEROTHERAPY  2000   VARICOSE VEIN SURGERY  1977    MEDICATIONS:  Bacillus Coagulans-Inulin (PROBIOTIC-PREBIOTIC PO)   Biotin 10 MG CAPS   Cholecalciferol (VITAMIN D) 50 MCG (2000 UT) CAPS   Cyanocobalamin (B-12 PO)   esomeprazole (NEXIUM) 40 MG capsule   levothyroxine (SYNTHROID, LEVOTHROID) 137 MCG tablet   mirabegron ER (MYRBETRIQ) 25 MG TB24 tablet   Phenazopyridine HCl (AZO TABS PO)   No current facility-administered medications for this encounter.    If no changes, I anticipate pt can proceed with surgery as scheduled.   Willeen Cass, PhD, FNP-BC [facility]/Anesthesiology Phone: [phone] 06/12/2022 10:19 AM

## 2022-06-12 NOTE — Anesthesia Preprocedure Evaluation (Addendum)
Anesthesia Evaluation  Patient identified by MRN, date of birth, ID band Patient awake    Reviewed: Allergy & Precautions, NPO status , Patient's Chart, lab work & pertinent test results  Airway Mallampati: II  TM Distance: >3 FB Neck ROM: Full    Dental  (+) Teeth Intact, Dental Advisory Given, Caps, Implants   Pulmonary neg pulmonary ROS,    breath sounds clear to auscultation       Cardiovascular hypertension,  Rhythm:Regular Rate:Normal     Neuro/Psych  Headaches, negative psych ROS   GI/Hepatic Neg liver ROS, hiatal hernia, GERD  Medicated,  Endo/Other  Hypothyroidism   Renal/GU negative Renal ROS     Musculoskeletal  (+) Arthritis ,   Abdominal Normal abdominal exam  (+)   Peds  Hematology negative hematology ROS (+)   Anesthesia Other Findings   Reproductive/Obstetrics                          Anesthesia Physical Anesthesia Plan  ASA: 2  Anesthesia Plan: General   Post-op Pain Management:    Induction: Intravenous  PONV Risk Score and Plan: 4 or greater and Ondansetron, Dexamethasone and Treatment may vary due to age or medical condition  Airway Management Planned: Oral ETT and Video Laryngoscope Planned  Additional Equipment: None  Intra-op Plan:   Post-operative Plan: Extubation in OR  Informed Consent: I have reviewed the patients History and Physical, chart, labs and discussed the procedure including the risks, benefits and alternatives for the proposed anesthesia with the patient or authorized representative who has indicated his/her understanding and acceptance.     Dental advisory given  Plan Discussed with: CRNA  Anesthesia Plan Comments: (See APP note by Durel Salts, FNP )     Anesthesia Quick Evaluation

## 2022-06-13 ENCOUNTER — Encounter: Payer: No Typology Code available for payment source | Attending: Psychology | Admitting: Psychology

## 2022-06-13 DIAGNOSIS — G894 Chronic pain syndrome: Secondary | ICD-10-CM | POA: Insufficient documentation

## 2022-06-13 DIAGNOSIS — G479 Sleep disorder, unspecified: Secondary | ICD-10-CM | POA: Insufficient documentation

## 2022-06-13 DIAGNOSIS — R413 Other amnesia: Secondary | ICD-10-CM | POA: Insufficient documentation

## 2022-06-13 DIAGNOSIS — F411 Generalized anxiety disorder: Secondary | ICD-10-CM | POA: Diagnosis not present

## 2022-06-13 DIAGNOSIS — M5136 Other intervertebral disc degeneration, lumbar region: Secondary | ICD-10-CM | POA: Diagnosis present

## 2022-06-13 DIAGNOSIS — R41840 Attention and concentration deficit: Secondary | ICD-10-CM | POA: Diagnosis not present

## 2022-06-13 NOTE — Progress Notes (Signed)
06/13/2022 8 AM-9 AM:  Today's visit was an in person visit as conducted in my outpatient clinic office.  The patient, her husband and myself were present for this visit.  Today I provided feedback regarding the results of the recent neuropsychological evaluation with diagnostic considerations and recommendations.  1 recommendation that is not in the body of the formal report is a recommendation that the patient have a sleep study to rule out the possibility of an obstructive sleep apnea.  The patient does report significant difficulties with sleep and this is confirmed by her husband.  He reports that she snores heavily.  The patient has a sister who has obstructive sleep apnea and and is treated with CPAP.  Given the patient's symptoms that would be appropriate to assess for the possibility of an obstructive sleep apnea condition considered to be playing a role in the patient's memory difficulties.  The patient also had acknowledges increased anxiety and increased vigilance over any errors that she makes.  The patient's husband's description of her memory issues are very consistent with those related to difficulties with encoding capacity rather than an inability to retain information over a period of delay.  I have included a copy of the summary of my formal evaluation below for convenience and it can be found in its entirety dated 05/07/2022.  Impression/Diagnosis:                     Overall, the results of the current neuropsychological evaluation are quite encouraging.  Looking at the patient's subjective reports derived from both the patient and her family members as well as objective neuropsychological testing and available medical information the results of the current neuropsychological evaluation would not be consistent with those typically seen with progressive cortical mediated dementia.  The patient does have 1 specific area of cognitive deficits clearly noted related to attention and concentration  primarily related to significant impairments for encoding of either visual or auditory information.  She also shows other attentional deficits related to information processing speed/focus execute abilities.  While these patterns could suggest subcortical types of deficits there were no other findings consistent with other subcortical involvement.  The patient shows no specific motor deficits or the subjective reports of the types of memory and learning deficits consistent with subcortical progressive dementia.  The pattern is clearly inconsistent with symptoms associated with the development of progressive cortical dementia such as Alzheimer's or Lewy body dementia.  The patient has experienced increases in anxiety and worry around difficulties and is likely becoming hypervigilant of any deficits for fear associated to changes with aging.  Some degree of changes in white matter tracts consistent with normal age-related changes may also play some role in her changes in cognitive functioning but these would be typical age-related changes.  The patient describes ongoing issues with sleep disturbance and chronic pain which would clearly have a negative impact on encoding and information processing speed.  I suspect that the primary difficulties are combination of normal age-related changes and subcortical white matter regions, consistent and persistent sleep difficulties, ongoing chronic pain symptoms and increasing anxiety and worry with increased hypervigilant over any errors or difficulties that she has.  Again, there does not appear to be any pattern consistent with Alzheimer's or other cortically mediated progressive dementia.  As far as treatment recommendations, I would suggest continued focus on any attempts we can make to improve her sleep pattern and manage her chronic pain conditions.  Related to the normal  age-related changes and will remain important for the patient to continue with a very healthy  dietary pattern with healthy foods such as fresh vegetables and fruits particularly the ones that include eating the peels of the fruit, whole grains and nuts etc.  The pattern should maintain as much physical activity as she can sustain on a day-to-day basis.  Working on sleep hygiene issues would also likely be helpful as well.  I will sit down with the patient and go over the results of the current neuropsychological evaluation with more specific recommendations included during that time.   Diagnosis:                                Attention and concentration deficit   Short-term memory loss   Generalized anxiety disorder   Sleeping difficulties   Degenerative disc disease, lumbar   Chronic pain syndrome     _____________________ Ilean Skill, Psy.D. Clinical Neuropsychologist

## 2022-06-17 ENCOUNTER — Other Ambulatory Visit: Payer: Self-pay

## 2022-06-17 ENCOUNTER — Observation Stay (HOSPITAL_COMMUNITY)
Admission: RE | Admit: 2022-06-17 | Discharge: 2022-06-18 | Disposition: A | Discharge status: Home / Self Care | Payer: No Typology Code available for payment source | Attending: Orthopedic Surgery | Admitting: Orthopedic Surgery

## 2022-06-17 ENCOUNTER — Ambulatory Visit (HOSPITAL_COMMUNITY): Payer: No Typology Code available for payment source

## 2022-06-17 ENCOUNTER — Ambulatory Visit (HOSPITAL_BASED_OUTPATIENT_CLINIC_OR_DEPARTMENT_OTHER): Payer: No Typology Code available for payment source | Admitting: Certified Registered"

## 2022-06-17 ENCOUNTER — Encounter (HOSPITAL_COMMUNITY): Payer: Self-pay | Admitting: Orthopedic Surgery

## 2022-06-17 ENCOUNTER — Encounter (HOSPITAL_COMMUNITY)
Admission: RE | Disposition: A | Discharge status: Home / Self Care | Payer: Self-pay | Source: Home / Self Care | Attending: Orthopedic Surgery

## 2022-06-17 ENCOUNTER — Ambulatory Visit (HOSPITAL_COMMUNITY): Payer: No Typology Code available for payment source | Admitting: Emergency Medicine

## 2022-06-17 DIAGNOSIS — I10 Essential (primary) hypertension: Secondary | ICD-10-CM | POA: Insufficient documentation

## 2022-06-17 DIAGNOSIS — M4722 Other spondylosis with radiculopathy, cervical region: Secondary | ICD-10-CM | POA: Diagnosis not present

## 2022-06-17 DIAGNOSIS — M50923 Unspecified cervical disc disorder at C6-C7 level: Secondary | ICD-10-CM | POA: Insufficient documentation

## 2022-06-17 DIAGNOSIS — E039 Hypothyroidism, unspecified: Secondary | ICD-10-CM | POA: Insufficient documentation

## 2022-06-17 DIAGNOSIS — M50922 Unspecified cervical disc disorder at C5-C6 level: Secondary | ICD-10-CM | POA: Insufficient documentation

## 2022-06-17 DIAGNOSIS — Z79899 Other long term (current) drug therapy: Secondary | ICD-10-CM | POA: Insufficient documentation

## 2022-06-17 DIAGNOSIS — M5020 Other cervical disc displacement, unspecified cervical region: Secondary | ICD-10-CM | POA: Diagnosis present

## 2022-06-17 HISTORY — PX: ANTERIOR CERVICAL DECOMP/DISCECTOMY FUSION: SHX1161

## 2022-06-17 SURGERY — ANTERIOR CERVICAL DECOMPRESSION/DISCECTOMY FUSION 2 LEVELS
Anesthesia: General | Site: Spine Cervical

## 2022-06-17 MED ORDER — BUPIVACAINE-EPINEPHRINE 0.25% -1:200000 IJ SOLN
INTRAMUSCULAR | Status: DC | PRN
  Administered 2022-06-17: 8 mL

## 2022-06-17 MED ORDER — ORAL CARE MOUTH RINSE: 15.0000 mL | Freq: Once | OROMUCOSAL | Status: AC

## 2022-06-17 MED ORDER — PHENOL 1.4 % MT LIQD: 1.0000 | OROMUCOSAL | Status: DC | PRN

## 2022-06-17 MED ORDER — PROPOFOL 10 MG/ML IV BOLUS
INTRAVENOUS | Status: DC | PRN
  Administered 2022-06-17: 150 mg via INTRAVENOUS

## 2022-06-17 MED ORDER — METHOCARBAMOL 500 MG PO TABS: 500.0000 mg | ORAL_TABLET | Freq: Three times a day (TID) | ORAL | 0 refills | Status: AC | PRN

## 2022-06-17 MED ORDER — FENTANYL CITRATE (PF) 100 MCG/2ML IJ SOLN
INTRAMUSCULAR | Status: AC
  Administered 2022-06-17: 50 ug via INTRAVENOUS
  Filled 2022-06-17: qty 2

## 2022-06-17 MED ORDER — BUPIVACAINE-EPINEPHRINE (PF) 0.25% -1:200000 IJ SOLN
INTRAMUSCULAR | Status: AC
  Filled 2022-06-17: qty 30

## 2022-06-17 MED ORDER — DEXAMETHASONE SODIUM PHOSPHATE 10 MG/ML IJ SOLN
INTRAMUSCULAR | Status: DC | PRN
  Administered 2022-06-17: 4 mg via INTRAVENOUS

## 2022-06-17 MED ORDER — LIDOCAINE 2% (20 MG/ML) 5 ML SYRINGE
INTRAMUSCULAR | Status: DC | PRN
  Administered 2022-06-17: 40 mg via INTRAVENOUS

## 2022-06-17 MED ORDER — SODIUM CHLORIDE 0.9 % IV SOLN: INTRAVENOUS | Status: DC

## 2022-06-17 MED ORDER — SURGIFLO WITH THROMBIN (HEMOSTATIC MATRIX KIT) OPTIME
TOPICAL | Status: DC | PRN
  Administered 2022-06-17: 1 via TOPICAL

## 2022-06-17 MED ORDER — OXYCODONE HCL 5 MG PO TABS
10.0000 mg | ORAL_TABLET | ORAL | Status: DC | PRN
  Filled 2022-06-17: qty 2

## 2022-06-17 MED ORDER — ONDANSETRON HCL 4 MG PO TABS: 4.0000 mg | ORAL_TABLET | Freq: Three times a day (TID) | ORAL | 0 refills | Status: DC | PRN

## 2022-06-17 MED ORDER — MENTHOL 3 MG MT LOZG
1.0000 | LOZENGE | OROMUCOSAL | Status: DC | PRN
  Filled 2022-06-17: qty 9

## 2022-06-17 MED ORDER — ONDANSETRON HCL 4 MG PO TABS: 4.0000 mg | ORAL_TABLET | Freq: Four times a day (QID) | ORAL | Status: DC | PRN

## 2022-06-17 MED ORDER — DEXAMETHASONE SODIUM PHOSPHATE 10 MG/ML IJ SOLN
INTRAMUSCULAR | Status: AC
  Filled 2022-06-17: qty 1

## 2022-06-17 MED ORDER — FENTANYL CITRATE (PF) 250 MCG/5ML IJ SOLN
INTRAMUSCULAR | Status: DC | PRN
  Administered 2022-06-17 (×2): 50 ug via INTRAVENOUS
  Administered 2022-06-17: 100 ug via INTRAVENOUS
  Administered 2022-06-17: 50 ug via INTRAVENOUS

## 2022-06-17 MED ORDER — ROCURONIUM BROMIDE 10 MG/ML (PF) SYRINGE
PREFILLED_SYRINGE | INTRAVENOUS | Status: DC | PRN
  Administered 2022-06-17: 20 mg via INTRAVENOUS
  Administered 2022-06-17: 60 mg via INTRAVENOUS

## 2022-06-17 MED ORDER — LACTATED RINGERS IV SOLN
INTRAVENOUS | Status: DC
Start: 2022-06-17 — End: 2022-06-18

## 2022-06-17 MED ORDER — PHENYLEPHRINE 80 MCG/ML (10ML) SYRINGE FOR IV PUSH (FOR BLOOD PRESSURE SUPPORT)
PREFILLED_SYRINGE | INTRAVENOUS | Status: DC | PRN
  Administered 2022-06-17: 160 ug via INTRAVENOUS
  Administered 2022-06-17 (×4): 80 ug via INTRAVENOUS

## 2022-06-17 MED ORDER — PROPOFOL 10 MG/ML IV BOLUS
INTRAVENOUS | Status: AC
  Filled 2022-06-17: qty 20

## 2022-06-17 MED ORDER — THROMBIN 20000 UNITS EX SOLR: CUTANEOUS | Status: DC | PRN

## 2022-06-17 MED ORDER — OXYCODONE HCL 5 MG/5ML PO SOLN: 5.0000 mg | Freq: Once | ORAL | Status: DC | PRN

## 2022-06-17 MED ORDER — OXYCODONE HCL 5 MG PO TABS: 5.0000 mg | ORAL_TABLET | Freq: Once | ORAL | Status: DC | PRN

## 2022-06-17 MED ORDER — ONDANSETRON HCL 4 MG/2ML IJ SOLN
INTRAMUSCULAR | Status: AC
  Filled 2022-06-17: qty 2

## 2022-06-17 MED ORDER — LACTATED RINGERS IV SOLN: INTRAVENOUS | Status: DC

## 2022-06-17 MED ORDER — OXYCODONE HCL 5 MG PO TABS
ORAL_TABLET | ORAL | Status: AC
  Filled 2022-06-17: qty 1

## 2022-06-17 MED ORDER — LIDOCAINE 2% (20 MG/ML) 5 ML SYRINGE
INTRAMUSCULAR | Status: AC
  Filled 2022-06-17: qty 5

## 2022-06-17 MED ORDER — METHOCARBAMOL 1000 MG/10ML IJ SOLN: 500.0000 mg | Freq: Four times a day (QID) | INTRAVENOUS | Status: DC | PRN

## 2022-06-17 MED ORDER — ROCURONIUM BROMIDE 10 MG/ML (PF) SYRINGE
PREFILLED_SYRINGE | INTRAVENOUS | Status: AC
  Filled 2022-06-17: qty 10

## 2022-06-17 MED ORDER — HYDROMORPHONE HCL 1 MG/ML IJ SOLN
INTRAMUSCULAR | Status: DC | PRN
  Administered 2022-06-17: .5 mg via INTRAVENOUS

## 2022-06-17 MED ORDER — ACETAMINOPHEN 650 MG RE SUPP: 650.0000 mg | RECTAL | Status: DC | PRN

## 2022-06-17 MED ORDER — 0.9 % SODIUM CHLORIDE (POUR BTL) OPTIME
TOPICAL | Status: DC | PRN
  Administered 2022-06-17 (×2): 1000 mL

## 2022-06-17 MED ORDER — POLYETHYLENE GLYCOL 3350 17 G PO PACK
17.0000 g | PACK | Freq: Every day | ORAL | Status: DC | PRN
Start: 2022-06-17 — End: 2022-06-18

## 2022-06-17 MED ORDER — OXYCODONE HCL 5 MG PO TABS: 5.0000 mg | ORAL_TABLET | ORAL | Status: DC | PRN

## 2022-06-17 MED ORDER — AMISULPRIDE (ANTIEMETIC) 5 MG/2ML IV SOLN: 10.0000 mg | Freq: Once | INTRAVENOUS | Status: DC | PRN

## 2022-06-17 MED ORDER — LEVOTHYROXINE SODIUM 137 MCG PO TABS
137.0000 ug | ORAL_TABLET | Freq: Every day | ORAL | Status: DC
  Administered 2022-06-18: 137 ug via ORAL
  Filled 2022-06-17: qty 1

## 2022-06-17 MED ORDER — FLEET ENEMA 7-19 GM/118ML RE ENEM: 1.0000 | ENEMA | Freq: Once | RECTAL | Status: DC | PRN

## 2022-06-17 MED ORDER — METHOCARBAMOL 500 MG PO TABS
500.0000 mg | ORAL_TABLET | Freq: Four times a day (QID) | ORAL | Status: DC | PRN
  Administered 2022-06-17 – 2022-06-18 (×2): 500 mg via ORAL
  Filled 2022-06-17 (×2): qty 1

## 2022-06-17 MED ORDER — ACETAMINOPHEN 10 MG/ML IV SOLN
INTRAVENOUS | Status: AC
  Filled 2022-06-17: qty 100

## 2022-06-17 MED ORDER — SODIUM CHLORIDE 0.9% FLUSH
3.0000 mL | Freq: Two times a day (BID) | INTRAVENOUS | Status: DC
Start: 2022-06-17 — End: 2022-06-18
  Administered 2022-06-17: 3 mL via INTRAVENOUS

## 2022-06-17 MED ORDER — ACETAMINOPHEN 325 MG PO TABS: 325.0000 mg | ORAL_TABLET | ORAL | Status: DC | PRN

## 2022-06-17 MED ORDER — FENTANYL CITRATE (PF) 100 MCG/2ML IJ SOLN
25.0000 ug | INTRAMUSCULAR | Status: DC | PRN
  Administered 2022-06-17: 25 ug via INTRAVENOUS

## 2022-06-17 MED ORDER — ACETAMINOPHEN 325 MG PO TABS
650.0000 mg | ORAL_TABLET | ORAL | Status: DC | PRN
  Administered 2022-06-17 – 2022-06-18 (×3): 650 mg via ORAL
  Filled 2022-06-17 (×3): qty 2

## 2022-06-17 MED ORDER — FENTANYL CITRATE (PF) 250 MCG/5ML IJ SOLN
INTRAMUSCULAR | Status: AC
  Filled 2022-06-17: qty 5

## 2022-06-17 MED ORDER — HYDROMORPHONE HCL 1 MG/ML IJ SOLN
INTRAMUSCULAR | Status: AC
  Filled 2022-06-17: qty 0.5

## 2022-06-17 MED ORDER — CEFAZOLIN SODIUM-DEXTROSE 1-4 GM/50ML-% IV SOLN
1.0000 g | Freq: Three times a day (TID) | INTRAVENOUS | Status: AC
  Administered 2022-06-17 (×2): 1 g via INTRAVENOUS
  Filled 2022-06-17 (×2): qty 50

## 2022-06-17 MED ORDER — HYDROMORPHONE HCL 1 MG/ML IJ SOLN: 0.5000 mg | INTRAMUSCULAR | Status: DC | PRN

## 2022-06-17 MED ORDER — THROMBIN 20000 UNITS EX SOLR
CUTANEOUS | Status: AC
  Filled 2022-06-17: qty 20000

## 2022-06-17 MED ORDER — ACETAMINOPHEN 160 MG/5ML PO SOLN: 325.0000 mg | ORAL | Status: DC | PRN

## 2022-06-17 MED ORDER — PHENYLEPHRINE 80 MCG/ML (10ML) SYRINGE FOR IV PUSH (FOR BLOOD PRESSURE SUPPORT)
PREFILLED_SYRINGE | INTRAVENOUS | Status: AC
  Filled 2022-06-17: qty 10

## 2022-06-17 MED ORDER — CEFAZOLIN SODIUM-DEXTROSE 2-4 GM/100ML-% IV SOLN
2.0000 g | INTRAVENOUS | Status: AC
  Administered 2022-06-17: 2 g via INTRAVENOUS
  Filled 2022-06-17: qty 100

## 2022-06-17 MED ORDER — SUGAMMADEX SODIUM 200 MG/2ML IV SOLN
INTRAVENOUS | Status: DC | PRN
  Administered 2022-06-17: 200 mg via INTRAVENOUS

## 2022-06-17 MED ORDER — CHLORHEXIDINE GLUCONATE 0.12 % MT SOLN
15.0000 mL | Freq: Once | OROMUCOSAL | Status: AC
  Administered 2022-06-17: 15 mL via OROMUCOSAL
  Filled 2022-06-17: qty 15

## 2022-06-17 MED ORDER — ONDANSETRON HCL 4 MG/2ML IJ SOLN: 4.0000 mg | Freq: Four times a day (QID) | INTRAMUSCULAR | Status: DC | PRN

## 2022-06-17 MED ORDER — OXYCODONE-ACETAMINOPHEN 10-325 MG PO TABS: 1.0000 | ORAL_TABLET | Freq: Four times a day (QID) | ORAL | 0 refills | Status: AC | PRN

## 2022-06-17 MED ORDER — EPHEDRINE SULFATE-NACL 50-0.9 MG/10ML-% IV SOSY
PREFILLED_SYRINGE | INTRAVENOUS | Status: DC | PRN
  Administered 2022-06-17: 5 mg via INTRAVENOUS
  Administered 2022-06-17: 15 mg via INTRAVENOUS
  Administered 2022-06-17: 5 mg via INTRAVENOUS

## 2022-06-17 MED ORDER — SODIUM CHLORIDE 0.9% FLUSH: 3.0000 mL | INTRAVENOUS | Status: DC | PRN

## 2022-06-17 MED ORDER — ACETAMINOPHEN 10 MG/ML IV SOLN
1000.0000 mg | Freq: Once | INTRAVENOUS | Status: DC | PRN
  Administered 2022-06-17: 1000 mg via INTRAVENOUS

## 2022-06-17 SURGICAL SUPPLY — 69 items
BAG COUNTER SPONGE SURGICOUNT (BAG) ×2 IMPLANT
BLADE CLIPPER SURG (BLADE) IMPLANT
BUR EGG ELITE 4.0 (BURR) IMPLANT
BUR MATCHSTICK NEURO 3.0 LAGG (BURR) IMPLANT
CABLE BIPOLOR RESECTION CORD (MISCELLANEOUS) ×2 IMPLANT
CAGE ENDOSKELETON TC 14X12X5 (Cage) ×1 IMPLANT
CAGE LORDOTIC 6 SM (Cage) ×2 IMPLANT
CANISTER SUCT 3000ML PPV (MISCELLANEOUS) ×2 IMPLANT
CLSR STERI-STRIP ANTIMIC 1/2X4 (GAUZE/BANDAGES/DRESSINGS) ×2 IMPLANT
COVER MAYO STAND STRL (DRAPES) ×4 IMPLANT
COVER SURGICAL LIGHT HANDLE (MISCELLANEOUS) ×4 IMPLANT
DRAIN CHANNEL 15F RND FF W/TCR (WOUND CARE) IMPLANT
DRAPE C-ARM 42X72 X-RAY (DRAPES) ×2 IMPLANT
DRAPE POUCH INSTRU U-SHP 10X18 (DRAPES) ×2 IMPLANT
DRAPE SURG 17X23 STRL (DRAPES) ×2 IMPLANT
DRAPE U-SHAPE 47X51 STRL (DRAPES) ×2 IMPLANT
DRSG OPSITE POSTOP 3X4 (GAUZE/BANDAGES/DRESSINGS) ×1 IMPLANT
DRSG OPSITE POSTOP 4X6 (GAUZE/BANDAGES/DRESSINGS) ×1 IMPLANT
DURAPREP 26ML APPLICATOR (WOUND CARE) ×2 IMPLANT
ELECT COATED BLADE 2.86 ST (ELECTRODE) ×2 IMPLANT
ELECT PENCIL ROCKER SW 15FT (MISCELLANEOUS) ×2 IMPLANT
ELECT REM PT RETURN 9FT ADLT (ELECTROSURGICAL) ×2
ELECTRODE REM PT RTRN 9FT ADLT (ELECTROSURGICAL) ×1 IMPLANT
GLOVE BIO SURGEON STRL SZ 6.5 (GLOVE) ×2 IMPLANT
GLOVE BIOGEL PI IND STRL 6.5 (GLOVE) ×1 IMPLANT
GLOVE BIOGEL PI IND STRL 8.5 (GLOVE) ×1 IMPLANT
GLOVE BIOGEL PI INDICATOR 6.5 (GLOVE) ×1
GLOVE BIOGEL PI INDICATOR 8.5 (GLOVE) ×1
GLOVE SS BIOGEL STRL SZ 8.5 (GLOVE) ×1 IMPLANT
GLOVE SUPERSENSE BIOGEL SZ 8.5 (GLOVE) ×1
GOWN STRL REUS W/ TWL LRG LVL3 (GOWN DISPOSABLE) ×1 IMPLANT
GOWN STRL REUS W/TWL 2XL LVL3 (GOWN DISPOSABLE) ×2 IMPLANT
GOWN STRL REUS W/TWL LRG LVL3 (GOWN DISPOSABLE) ×2
KIT BASIN OR (CUSTOM PROCEDURE TRAY) ×2 IMPLANT
KIT TURNOVER KIT B (KITS) ×2 IMPLANT
NDL SPNL 18GX3.5 QUINCKE PK (NEEDLE) ×1 IMPLANT
NEEDLE HYPO 22GX1.5 SAFETY (NEEDLE) ×2 IMPLANT
NEEDLE SPNL 18GX3.5 QUINCKE PK (NEEDLE) ×2 IMPLANT
NS IRRIG 1000ML POUR BTL (IV SOLUTION) ×2 IMPLANT
PACK ORTHO CERVICAL (CUSTOM PROCEDURE TRAY) ×2 IMPLANT
PACK UNIVERSAL I (CUSTOM PROCEDURE TRAY) ×2 IMPLANT
PAD ARMBOARD 7.5X6 YLW CONV (MISCELLANEOUS) ×4 IMPLANT
PATTIES SURGICAL .25X.25 (GAUZE/BANDAGES/DRESSINGS) ×2 IMPLANT
PIN DISTRACTION MAXCESS-C 14 (PIN) ×2 IMPLANT
PLATE ACP 1.6X36 2LVL (Plate) ×1 IMPLANT
POSITIONER HEAD DONUT 9IN (MISCELLANEOUS) ×2 IMPLANT
PUTTY BONE DBX 2.5 MIS (Bone Implant) ×1 IMPLANT
RESTRAINT LIMB HOLDER UNIV (RESTRAINTS) ×2 IMPLANT
SCREW ACP 3.5 X 13 S/D VARIA (Screw) ×10 IMPLANT
SCREW ACP 3.5X13 S/D VAR ANGLE (Screw) IMPLANT
SCREW ACP VA ST 4X13 (Screw) ×1 IMPLANT
SPONGE INTESTINAL PEANUT (DISPOSABLE) ×2 IMPLANT
SPONGE SURGIFOAM ABS GEL 100 (HEMOSTASIS) ×2 IMPLANT
SPONGE T-LAP 4X18 ~~LOC~~+RFID (SPONGE) ×3 IMPLANT
SURGIFLO W/THROMBIN 8M KIT (HEMOSTASIS) ×1 IMPLANT
SUT BONE WAX W31G (SUTURE) ×2 IMPLANT
SUT MNCRL AB 3-0 PS2 27 (SUTURE) ×2 IMPLANT
SUT SILK 2 0 (SUTURE) ×2
SUT SILK 2-0 18XBRD TIE 12 (SUTURE) IMPLANT
SUT VIC AB 2-0 CT1 18 (SUTURE) ×2 IMPLANT
SUT VIC AB 2-0 CT1 27 (SUTURE) ×2
SUT VIC AB 2-0 CT1 TAPERPNT 27 (SUTURE) IMPLANT
SYR BULB IRRIG 60ML STRL (SYRINGE) ×2 IMPLANT
SYR CONTROL 10ML LL (SYRINGE) ×2 IMPLANT
TAPE CLOTH 4X10 WHT NS (GAUZE/BANDAGES/DRESSINGS) ×2 IMPLANT
TAPE UMBILICAL COTTON 1/8X30 (MISCELLANEOUS) ×3 IMPLANT
TOWEL GREEN STERILE (TOWEL DISPOSABLE) ×2 IMPLANT
TOWEL GREEN STERILE FF (TOWEL DISPOSABLE) ×2 IMPLANT
WATER STERILE IRR 1000ML POUR (IV SOLUTION) ×2 IMPLANT

## 2022-06-17 NOTE — Anesthesia Postprocedure Evaluation (Signed)
Anesthesia Post Note  Patient: Theresa Holmes  Procedure(s) Performed: ANTERIOR CERVICAL DISCECTOMY AND FUSION CERVICAL FIVE TO SEVEN (Spine Cervical)     Patient location during evaluation: PACU Anesthesia Type: General Level of consciousness: awake and alert Pain management: pain level controlled Vital Signs Assessment: post-procedure vital signs reviewed and stable Respiratory status: spontaneous breathing, nonlabored ventilation, respiratory function stable and patient connected to nasal cannula oxygen Cardiovascular status: blood pressure returned to baseline and stable Postop Assessment: no apparent nausea or vomiting Anesthetic complications: yes   Encounter Notable Events  Notable Event Outcome Phase Comment  Difficult to intubate - expected  Intraprocedure Filed from anesthesia note documentation.    Last Vitals:  Vitals:   06/17/22 1230 06/17/22 1303  BP: (!) 121/43 (!) 164/78  Pulse: 86 82  Resp: 20 20  Temp: 36.5 C   SpO2: 97% 97%    Last Pain:  Vitals:   06/17/22 1315  TempSrc:   PainSc: 3                  Effie Berkshire

## 2022-06-17 NOTE — H&P (Signed)
History: Theresa Holmes presents today with ongoing severe neck and radicular left arm pain. She has numbness and dysesthesias and progressive radicular arm pain despite selective nerve root block therapy. She is attempted to go to physical therapy as well as perform physician directed exercises but because of the pain and has been unsuccessful. Patient states their pain level is 7/10. The pain significantly interferes with activities of daily living as well as overall quality of life.  As result we elected to move forward with an ACDF C5-7.    Past Medical History:  Diagnosis Date   Arthritis    Atypical chest pain 10/22/2016   Chronic back pain    Exogenous obesity    GERD (gastroesophageal reflux disease)    H/O hiatal hernia    History of kidney stones    Hypercholesterolemia    Hyperlipidemia    Hypertension    12/14/2018 pt. denies   Hypothyroidism    HX PARTIAL THYROIDECTOMY FOR  NODULE- BENIGN   Shingles 11/2012   Spinal stenosis    Thyroid mass    right, benign   Varicose vein     No Known Allergies  No current facility-administered medications on file prior to encounter.   Current Outpatient Medications on File Prior to Encounter  Medication Sig Dispense Refill   Bacillus Coagulans-Inulin (PROBIOTIC-PREBIOTIC PO) Take 1 tablet by mouth daily.     Biotin 10 MG CAPS Take 10 mg by mouth daily.     Cholecalciferol (VITAMIN D) 50 MCG (2000 UT) CAPS Take 2,000 Units by mouth daily.     Cyanocobalamin (B-12 PO) Take 1 tablet by mouth daily.     esomeprazole (NEXIUM) 40 MG capsule Take 40 mg by mouth daily.     levothyroxine (SYNTHROID, LEVOTHROID) 137 MCG tablet Take 137 mcg by mouth daily before breakfast.     Phenazopyridine HCl (AZO TABS PO) Take 1 tablet by mouth daily.     mirabegron ER (MYRBETRIQ) 25 MG TB24 tablet Take 1 tablet (25 mg total) by mouth daily. One po qd (Patient not taking: Reported on 06/10/2022) 30 tablet 1    Physical Exam: Vitals:   06/17/22  0602 06/17/22 0621  BP: (!) 172/87 (!) 153/85  Pulse: 77   Resp: 18   Temp: 98.6 F (37 C)   SpO2: 92%    Body mass index is 32.12 kg/m. Clinical exam: Theresa Holmes is a pleasant individual, who appears younger than their stated age.  She is alert and orientated 3.  No shortness of breath, chest pain.  Abdomen is soft and non-tender, negative loss of bowel and bladder control, no rebound tenderness.  Negative: skin lesions abrasions contusions  Peripheral pulses: 2+ radial artery pulses bilaterally. LE compartments are: Soft and nontender.  Gait pattern: Normal  Assistive devices: None  Neuro: 5/5 motor strength in the upper extremity bilaterally. Positive numbness and dysesthesias in the C6 and C7 dermatome on the left side. Negative Hoffman test, 1+ deep tendon reflexes in the upper extremity. Positive Spurling sign with reproduction of left upper extremity pain.  Musculoskeletal: Patient has chronic left anterior shoulder range of motion deficits due to rotator cuff tear surgery. Moderate neck pain but complains of significant posterior scapular pain radiating into the left upper extremity.  Imaging:   X-rays of the cervical spine demonstrate multilevel degenerative cervical disc disease. Positive anterior exostosis formation but lordosis is still maintained. Flexion-extension views demonstrate no change in the slight anterior listhesis with at C4-5 and C7-T1.  Cervical MRI: completed on 04/13/2022: Severe left neuroforaminal narrowing at C5-6 and C6-7 producing compression of the exiting C7 and C6 nerves. Moderate to severe left C3-4 neuroforaminal narrowing. Moderate right C4-5 narrowing. Mild degenerative changes. Mild levoconvex scoliosis. No cord signal changes.  Temporary minimal improvement with left C5, C6, and C7 selective nerve root blocks.   A/P: Summary: Theresa Holmes presents today with ongoing severe neck and radicular left arm pain. She has numbness and dysesthesias and  progressive radicular arm pain despite selective nerve root block therapy. She is attempted to go to physical therapy as well as perform physician directed exercises but because of the pain and has been unsuccessful. Patient states their pain level is 7/10. The pain significantly interferes with activities of daily living as well as overall quality of life.  Diagnosis: At this point time despite conservative treatment her quality of life is continued to deteriorate. Patient has predominantly neck and radicular left arm pain. Her imaging studies show multilevel degenerative changes but the most prominent disease on the left is at C5-6 and C6-7. Radiographically this is also where she has the most degeneration. While she does have anterior listhesis at C4-5 and C7-T1 there is no significant motion on flexion-extension at these levels. She also has multilevel facet arthrosis and degenerative disease at the C3-4 level. However the likelihood of improvement with a multilevel procedure is left quite low. I believe her primary pain generator is at C5-6 and C6-7. Patient did get improvement with the C6 and C7 selective nerve root block. Given the failure to improve and the progressive nature of her pain she is elected to move forward with surgery.  I recommended a two-level ACDF C5-7. We have gone over the surgical procedure in great detail and all of her questions were addressed. I reviewed the risks and benefits and she is expressed an understanding. Risks and benefits of surgery were discussed with the patient. These include: Infection, bleeding, death, stroke, paralysis, ongoing or worse pain, need for additional surgery, nonunion, leak of spinal fluid, adjacent segment degeneration requiring additional fusion surgery. Pseudoarthrosis (nonunion)requiring supplemental posterior fixation. Throat pain, swallowing difficulties, hoarseness or change in voice.

## 2022-06-17 NOTE — Brief Op Note (Signed)
06/17/2022  11:14 AM  PATIENT:  Theresa Holmes  82 y.o. 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

## 2022-06-17 NOTE — Op Note (Signed)
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  Left radicular arm pain  POST-OPERATIVE DIAGNOSIS: Same  PROCEDURE:   ACDF C5-7  SURGEON:  Melina Schools, MD  PHYSICIAN ASSISTANT: None  ANESTHESIA:   General  EBL: 50 ml   Complications: None  Implants: Titan endoskeleton intervertebral titanium cage.  6 mm small lordotic implant C5-6 and C6-7. NuVasive ACP cervical plate.  Affixed with 13 mm locking screws C5-7.  Graft: DBX mix  BRIEF HISTORY: Theresa Holmes is a 81 y.o. female who presents to my office with complaints of severe radicular left arm pain and neck pain.  Despite prolonged conservative treatment her quality of life continued to deteriorate.  As result we elected to move forward with surgery.  All appropriate risks, benefits and alternatives to surgery were discussed and consent was obtained  PROCEDURE DETAILS: Patient was brought into the operating room and was properly positioned on the operating room table.  After induction with general anesthesia the patient was endotracheally intubated.  A timeout was taken to confirm all important data: including patient, procedure, and the level. Teds, SCD's were applied.   Patient was properly positioned on the surgical table for an ACDF.  Secured at the side and an inflatable pressure tube was placed between the shoulder blades.  The neck was prepped and draped in a standard fashion.  Using fluoroscopy identified the C6 vertebral body and marked out my incision I infiltrated this with quarter percent Marcaine with epinephrine.  Transverse incision was made and sharp dissection was carried down to and through the platysma.  This was a standard Smith-Robinson approach via a transverse incision to the cervical spine.  I dissected along the medial border the  sternocleidomastoid and continued down to the deep cervical and prevertebral fascia.  The omohyoid muscle was isolated and mobilized for visualization.  I then placed a hand-held retractor to retract the esophagus and trachea to the right and used Kitner dissectors to mobilize the remainder of the prevertebral fascia until I could visualize C5-C7.  A needle was placed into the C5-6 disc space and an x-ray was taken confirming I was at the appropriate level.  The disc space was marked with the Bovie and then I mobilized the longus coli muscle from the superior aspect of C5 to the inferior aspect of C7 using bipolar cautery.  Self-retaining Caspar retractor was placed underneath the longus coli muscle and I deflated the endotracheal cuff and expanded the retractor to expose the C6-7 level and then reinflated the endotracheal cuff.  The anterior bone spur was removed with double-action Leksell rongeur.  Once the exostosis was removed an annulotomy was performed with a 15 blade scalpel and I remove the bulk of the disc material with a combination of pituitary rongeurs, curettes, and Kerrison rongeurs.  Once I had removed all of the disc material I placed the distraction pins into the body of C6 and C7 and distracted the intervertebral space with a lamina spreader, and maintained the distraction with the the distraction pin set.  I continue to work posteriorly until I was able to pass my 1 mm Kerrison below the posterior osteophyte and remove it.  I then used a fine nerve hook to dissect through the posterior longitudinal ligament until I could create a plane between the PLL and the thecal sac.  I then used my 1  mm Kerrison rongeur to resect the PLL.  This allowed me to dissect the uncovertebral joint and continue to decompress the nerve.  Once I completed the decompression I used fluoroscopy to confirm that I could easily pass my nerve hook under the uncovertebral joint and along the inferior aspect of the vertebral  body.  At this point I was very comfortable with my decompression.  I then used the rasp to trial the intervertebral spacers and elected to use the 6 mm small implant.  I rasped the endplates until a bleeding subchondral bone packed the cage with the appropriate allograft and then malleted into position.  X-rays are satisfactory and so I repositioned the retracting system to expose the C5-6 level.  Using the same exact technique I performed a discectomy at C5-6.  I again took down the posterior longitudinal ligament and removed it with the 1 mm Kerrison rongeur and trim down the uncovertebral joint to further decompress the nerve.  I was able to freely pass my nerve hook under the uncovertebral joint bilaterally and along the undersurface of the vertebral body.  With the discectomy completed at this level I rasped the endplates and elected to use the 6 mm small lordotic implant.  Implant was packed with the graft and malleted into position.  Imaging studies demonstrated satisfactory position of both intervertebral cages.  A 36 mm anterior cervical plate was then contoured and secured to the vertebral body of C5 and C7 with 13 mm screws.  Imaging demonstrated satisfactory position of plate and screws.  All 4 screws had excellent purchase.  I then placed two additional 13 mm length screws into the body of C6.  Imaging demonstrated symmetric position in both the AP and lateral planes.  All 6 screws had excellent purchase and then were locked to the plate according to manufacture standards.  The wound was copiously irrigated with normal saline and I confirmed hemostasis.  I then returned the trach and esophagus to midline and closed platysma with interrupted 2-0 Vicryl suture.  I then closed the skin with running 3-0 Monocryl stitch.  Steri-Strips and a dry dressing were applied and the patient was extubated transfer the PACU without incident.  End of the case all needle sponge counts were correct.  There were no  adverse intraoperative events.  Melina Schools, MD 06/17/2022 11:04 AM

## 2022-06-17 NOTE — Evaluation (Signed)
Occupational Therapy Evaluation Patient Details Name: Theresa Holmes MRN: [id number] DOB: [birth year] Today's Date: 06/17/2022   History of Present Illness Pt is a 81 yo female s/p ANTERIOR CERVICAL DISCECTOMY AND FUSION CERVICAL FIVE TO SEVEN/ PHMx: shingles, HTN, left rotator cuff repair, right carpal tunnel release, L4-5 fusion 2005.   Clinical Impression   This 81 yo female admitted and underwent above presents to acute OT with PLOF of being able to do her own basic ADLs. Currently due to new cervical precautions and cervical collar she needs setup/S-Mod A for basic ADLs. She will continue to benefit from acute OT without need for follow up.      Recommendations for follow up therapy are one component of a multi-disciplinary discharge planning process, led by the attending physician.  Recommendations may be updated based on patient status, additional functional criteria and insurance authorization.   Follow Up Recommendations  No OT follow up    Assistance Recommended at Discharge Frequent or constant Supervision/Assistance  Patient can return home with the following A little help with bathing/dressing/bathroom;A little help with walking and/or transfers;Assistance with cooking/housework;Help with stairs or ramp for entrance;Assist for transportation    Functional Status Assessment  Patient has had a recent decline in their functional status and demonstrates the ability to make significant improvements in function in a reasonable and predictable amount of time.  Equipment Recommendations  None recommended by OT       Precautions / Restrictions Precautions Precautions: Cervical Precaution Booklet Issued: Yes (comment) Required Braces or Orthoses: Cervical Brace Cervical Brace: Hard collar (off for showering) Restrictions Weight Bearing Restrictions: No      Mobility Bed Mobility Overal bed mobility: Needs Assistance Bed Mobility: Rolling, Sidelying to Sit, Sit to  Sidelying Rolling: Supervision Sidelying to sit: Supervision     Sit to sidelying: Supervision      Transfers Overall transfer level: Needs assistance Equipment used: Rolling walker (2 wheels) Transfers: Sit to/from Stand Sit to Stand: Min guard                  Balance Overall balance assessment: Needs assistance Sitting-balance support: No upper extremity supported, Feet supported Sitting balance-Leahy Scale: Fair     Standing balance support: Bilateral upper extremity supported Standing balance-Leahy Scale: Poor                             ADL either performed or assessed with clinical judgement   ADL Overall ADL's : Needs assistance/impaired Eating/Feeding: Independent;Sitting   Grooming: Set up;Supervision/safety;Standing   Upper Body Bathing: Set up;Supervision/ safety;Sitting   Lower Body Bathing: Min guard;Sit to/from stand   Upper Body Dressing : Supervision/safety;Set up;Sitting   Lower Body Dressing: Moderate assistance Lower Body Dressing Details (indicate cue type and reason): min guard A sit<>stand Toilet Transfer: Min guard;Ambulation;Rolling walker (2 wheels)   Toileting- Clothing Manipulation and Hygiene: Min guard;Sit to/from stand               Vision Patient Visual Report: No change from baseline              Pertinent Vitals/Pain Pain Assessment Pain Assessment: Faces Faces Pain Scale: Hurts a little bit Pain Location: neck Pain Descriptors / Indicators: Aching, Sore Pain Intervention(s): Limited activity within patient's tolerance, Monitored during session, Repositioned     Hand Dominance Right   Extremity/Trunk Assessment Upper Extremity Assessment Upper Extremity Assessment: LUE deficits/detail LUE Deficits / Details: Decreased AROM shoulder (old  rotator cuff repair)           Communication Communication Communication: No difficulties   Cognition Arousal/Alertness: Awake/alert Behavior During  Therapy: WFL for tasks assessed/performed                                   General Comments: reports short term memory deficits                Home Living Family/patient expects to be discharged to:: Private residence Living Arrangements: Spouse/significant other;Children Available Help at Discharge: Family;Available PRN/intermittently Type of Home: House Home Access: Stairs to enter [employer] of Steps: 3 Entrance Stairs-Rails: Right Home Layout: Two level;Able to live on main level with bedroom/bathroom     Bathroom Shower/Tub: Hospital doctor Toilet: Handicapped height     Home Equipment: Conservation officer, nature (2 wheels);Shower seat - built in;Grab bars - toilet;Grab bars - tub/shower;Hand held shower head          Prior Functioning/Environment Prior Level of Function : Independent/Modified Independent                        OT Problem List: Impaired balance (sitting and/or standing);Pain      OT Treatment/Interventions: Self-care/ADL training;DME and/or AE instruction;Patient/family education;Balance training    OT Goals(Current goals can be found in the care plan section) Acute Rehab OT Goals Patient Stated Goal: to go home tomorrow OT Goal Formulation: With patient Time For Goal Achievement: 07/01/22 Potential to Achieve Goals: Good  OT Frequency: Min 2X/week       AM-PAC OT "6 Clicks" Daily Activity     Outcome Measure Help from another person eating meals?: A Little (setup) Help from another person taking care of personal grooming?: A Little Help from another person toileting, which includes using toliet, bedpan, or urinal?: A Little Help from another person bathing (including washing, rinsing, drying)?: A Lot Help from another person to put on and taking off regular upper body clothing?: A Little Help from another person to put on and taking off regular lower body clothing?: A Lot 6 Click Score: 16   End of  Session Equipment Utilized During Treatment: Gait belt;Rolling walker (2 wheels);Cervical collar  Activity Tolerance: Patient tolerated treatment well Patient left: in bed;with call bell/phone within reach;with family/visitor present  OT Visit Diagnosis: Unsteadiness on feet (R26.81);Other abnormalities of gait and mobility (R26.89);Pain Pain - part of body:  (neck)                Time: 6226-3335 OT Time Calculation (min): 34 min Charges:  OT General Charges $OT Visit: 1 Visit OT Evaluation $OT Eval Moderate Complexity: 1 Mod OT Treatments $Self Care/Home Management : 8-22 mins  Golden Circle, OTR/L Acute Re[facility] [phone] Office [phone]    Almon Register 06/17/2022, 6:33 PM

## 2022-06-17 NOTE — Anesthesia Procedure Notes (Signed)
Procedure Name: Intubation Date/Time: 06/17/2022 7:51 AM  Performed by: Cathren Harsh, CRNAPre-anesthesia Checklist: Patient identified, Emergency Drugs available, Suction available and Patient being monitored Patient Re-evaluated:Patient Re-evaluated prior to induction Oxygen Delivery Method: Circle System Utilized Preoxygenation: Pre-oxygenation with 100% oxygen Induction Type: IV induction Ventilation: Mask ventilation without difficulty and Oral airway inserted - appropriate to patient size Laryngoscope Size: Glidescope and 3 Grade View: Grade I Tube type: Oral Tube size: 7.0 mm Number of attempts: 1 Airway Equipment and Method: Stylet and Oral airway Placement Confirmation: ETT inserted through vocal cords under direct vision, positive ETCO2 and breath sounds checked- equal and bilateral Secured at: 21 cm Tube secured with: Tape Dental Injury: Teeth and Oropharynx as per pre-operative assessment  Difficulty Due To: Difficulty was anticipated and Difficult Airway- due to reduced neck mobility Comments: Head/neck maintained in neutral positioning throughout induction/intubation sequence.

## 2022-06-17 NOTE — Transfer of Care (Signed)
Immediate Anesthesia Transfer of Care Note  Patient: Theresa Holmes  Procedure(s) Performed: ANTERIOR CERVICAL DISCECTOMY AND FUSION CERVICAL FIVE TO SEVEN (Spine Cervical)  Patient Location: PACU  Anesthesia Type:General  Level of Consciousness: drowsy, patient cooperative and responds to stimulation  Airway & Oxygen Therapy: Patient Spontanous Breathing and Patient connected to face mask oxygen  Post-op Assessment: Report given to RN and Post -op Vital signs reviewed and stable  Post vital signs: Reviewed and stable  Last Vitals:  Vitals Value Taken Time  BP 132/61 06/17/22 1113  Temp 36.7 C 06/17/22 1113  Pulse 83 06/17/22 1113  Resp 18 06/17/22 1113  SpO2 95 % 06/17/22 1113  Vitals shown include unvalidated device data.  Last Pain:  Vitals:   06/17/22 0613  TempSrc:   PainSc: 0-No pain         Complications:  Encounter Notable Events  Notable Event Outcome Phase Comment  Difficult to intubate - expected  Intraprocedure Filed from anesthesia note documentation.

## 2022-06-17 NOTE — Discharge Instructions (Signed)

## 2022-06-18 ENCOUNTER — Encounter (HOSPITAL_COMMUNITY): Payer: Self-pay | Admitting: Orthopedic Surgery

## 2022-06-18 DIAGNOSIS — M4722 Other spondylosis with radiculopathy, cervical region: Secondary | ICD-10-CM | POA: Diagnosis not present

## 2022-06-18 NOTE — Progress Notes (Signed)
    Subjective: Procedure(s) (LRB): ANTERIOR CERVICAL DISCECTOMY AND FUSION CERVICAL FIVE TO SEVEN (N/A) 1 Day Post-Op  Patient reports pain as 1 on 0-10 scale.  Reports decreased arm pain reports incisional neck pain   Positive void Negative bowel movement Positive flatus Negative chest pain or shortness of breath  Objective: Vital signs in last 24 hours: Temp:  [97.7 F (36.5 C)-98.5 F (36.9 C)] 97.8 F (36.6 C) (07/25 0415) Pulse Rate:  [65-87] 75 (07/25 0415) Resp:  [9-22] 18 (07/25 0415) BP: (98-164)/(43-121) 98/61 (07/25 0415) SpO2:  [94 %-99 %] 99 % (07/25 0415)  Intake/Output from previous day: 07/24 0701 - 07/25 0700 In: 1250 [I.V.:1100; IV Piggyback:150] Out: 100 [Blood:100]  Labs: No results for input(s): "WBC", "RBC", "HCT", "PLT" in the last 72 hours. No results for input(s): "NA", "K", "CL", "CO2", "BUN", "CREATININE", "GLUCOSE", "CALCIUM" in the last 72 hours. No results for input(s): "LABPT", "INR" in the last 72 hours.  Physical Exam: Neurologically intact ABD soft Intact pulses distally Incision: dressing C/D/I and no drainage Compartment soft Body mass index is 32.12 kg/m.  Assessment/Plan: Patient stable  xrays n/a Mobilization with physical therapy Encourage incentive spirometry Continue care  Procedure patient is doing quite well.  She denies any significant arm pain but she does note some mild dysesthesias in the thumb.  She has no swelling or drainage from the wound site.  Plan on discharge to home.  Instructions have been provided as have medications.  She will follow-up with me in 2 weeks.  Melina Schools, MD [facility] [phone]

## 2022-06-18 NOTE — Evaluation (Signed)
Physical Therapy Evaluation and Discharge Patient Details Name: Theresa Holmes MRN: [id number] DOB: Aug 01, 1941 Today's Date: 06/18/2022  History of Present Illness  Pt is a 81 yo female s/p ANTERIOR CERVICAL DISCECTOMY AND FUSION CERVICAL FIVE TO SEVEN/ PHMx: shingles, HTN, left rotator cuff repair, right carpal tunnel release, L4-5 fusion 2005.  Clinical Impression   Patient evaluated by Physical Therapy with no further acute PT needs identified. All education has been completed and the patient has no further questions. Husband present for all education and they either verbalized or return demonstrated all functional tasks and precautions. PT is signing off. Thank you for this referral.        Recommendations for follow up therapy are one component of a multi-disciplinary discharge planning process, led by the attending physician.  Recommendations may be updated based on patient status, additional functional criteria and insurance authorization.  Follow Up Recommendations No PT follow up      Assistance Recommended at Discharge Frequent or constant Supervision/Assistance  Patient can return home with the following  A little help with walking and/or transfers;Assistance with cooking/housework;Direct supervision/assist for medications management;Direct supervision/assist for financial management;Help with stairs or ramp for entrance    Equipment Recommendations None recommended by PT  Recommendations for Other Services       Functional Status Assessment Patient has had a recent decline in their functional status and demonstrates the ability to make significant improvements in function in a reasonable and predictable amount of time.     Precautions / Restrictions Precautions Precautions: Cervical Precaution Booklet Issued: Yes (comment) Precaution Comments: pt able to verbalize cervical precautions Required Braces or Orthoses: Cervical Brace Cervical Brace: Hard collar (off in bed  and showering; hard or soft collar on when up and about per pt and her husband in room) Restrictions Weight Bearing Restrictions: No      Mobility  Bed Mobility               General bed mobility comments: sitting on side of bed on arrival having completed OT session; pt/husband report no concerns or questions re: bed mobility    Transfers Overall transfer level: Needs assistance Equipment used: Rolling walker (2 wheels) Transfers: Sit to/from Stand Sit to Stand: Min guard           General transfer comment: repeated x 2 for repetition for appropriate hand placement and being sure to back up to feel surface on the back of her legs prior to sitting (especially since cannot turn head/neck to look back for surface)    Ambulation/Gait Ambulation/Gait assistance: Min guard Gait Distance (Feet): 120 Feet Assistive device: Rolling walker (2 wheels) Gait Pattern/deviations: Step-through pattern, Decreased stride length, Trendelenburg       General Gait Details: reports slight trendelenburg gait since back surgery; not noted when using RW  Stairs Stairs: Yes Stairs assistance: Min guard Stair Management: One rail Right, Alternating pattern, Forwards Number of Stairs: 4 General stair comments: pt instructed in step to pattern, but proceeded with step-through and did fine with this  Wheelchair Mobility    Modified Rankin (Stroke Patients Only)       Balance Overall balance assessment: Needs assistance Sitting-balance support: No upper extremity supported, Feet supported Sitting balance-Leahy Scale: Fair     Standing balance support: Bilateral upper extremity supported Standing balance-Leahy Scale: Poor Standing balance comment: discussed recommend using RW for at least 1 week as neck is healing and she's using pain meds  Pertinent Vitals/Pain Pain Assessment Pain Assessment: Faces Faces Pain Scale: Hurts a little  bit Pain Location: neck Pain Descriptors / Indicators: Aching, Sore Pain Intervention(s): Limited activity within patient's tolerance, Monitored during session    Home Living Family/patient expects to be discharged to:: Private residence Living Arrangements: Spouse/significant other;Children Available Help at Discharge: Family;Available PRN/intermittently Type of Home: House Home Access: Stairs to enter Entrance Stairs-Rails: Right Entrance Stairs-Number of Steps: 3   Home Layout: Two level;Able to live on main level with bedroom/bathroom Home Equipment: Rolling Walker (2 wheels);Shower seat - built in;Grab bars - toilet;Grab bars - tub/shower;Hand held shower head      Prior Function Prior Level of Function : Independent/Modified Independent                     Hand Dominance   Dominant Hand: Right    Extremity/Trunk Assessment   Upper Extremity Assessment Upper Extremity Assessment: Defer to OT evaluation LUE Deficits / Details: Decreased AROM shoulder (old rotator cuff repair)    Lower Extremity Assessment Lower Extremity Assessment: Generalized weakness (rt gluteal weakness with trendelenburg gait--reports present since back surgery)    Cervical / Trunk Assessment Cervical / Trunk Assessment: Neck Surgery  Communication   Communication: No difficulties  Cognition Arousal/Alertness: Awake/alert Behavior During Therapy: WFL for tasks assessed/performed                                   General Comments: reports short term memory deficits        General Comments General comments (skin integrity, edema, etc.): Husband present--orthopedic surgeon and clearly familiar with education being provided to wife. (Important as she admits to short-term memory deficits)    Exercises     Assessment/Plan    PT Assessment Patient does not need any further PT services  PT Problem List         PT Treatment Interventions      PT Goals (Current  goals can be found in the Care Plan section)  Acute Rehab PT Goals Patient Stated Goal: go home today PT Goal Formulation: All assessment and education complete, DC therapy    Frequency       Co-evaluation               AM-PAC PT "6 Clicks" Mobility  Outcome Measure Help needed turning from your back to your side while in a flat bed without using bedrails?: A Little Help needed moving from lying on your back to sitting on the side of a flat bed without using bedrails?: A Little Help needed moving to and from a bed to a chair (including a wheelchair)?: A Little Help needed standing up from a chair using your arms (e.g., wheelchair or bedside chair)?: A Little Help needed to walk in hospital room?: A Little Help needed climbing 3-5 steps with a railing? : A Little 6 Click Score: 18    End of Session Equipment Utilized During Treatment: Gait belt Activity Tolerance: Patient tolerated treatment well Patient left: in bed;with call bell/phone within reach;with family/visitor present Nurse Communication: Mobility status;Other (comment) (ready for discharge) PT Visit Diagnosis: Unsteadiness on feet (R26.81)    Time: 1610-9604 PT Time Calculation (min) (ACUTE ONLY): 13 min   Charges:   PT Evaluation $PT Eval Low Complexity: [place], PT Acute Rehabilitation Services  Office [phone]  Jeanie Cooks Rhyli Depaula 06/18/2022, 9:34 AM

## 2022-06-18 NOTE — Plan of Care (Signed)
Pt doing well. Pt and husband given D/C instructions with verbal understanding. Rx's were given to the Pt at D/C. Pt's incision is clean and dry with no sign of infection. Pt's IV was removed prior to D/C. Pt D/C'd home via wheelchair per MD order. Pt is stable @ D/C and has no other needs at this time. Holli Humbles, RN

## 2022-06-18 NOTE — Progress Notes (Signed)
Occupational Therapy Treatment and Discharge Patient Details Name: Theresa Holmes MRN: [id number] DOB: 12/04/1940 Today's Date: 06/18/2022   History of present illness Pt is a 81 yo female s/p ANTERIOR CERVICAL DISCECTOMY AND FUSION CERVICAL FIVE TO SEVEN/ PHMx: shingles, HTN, left rotator cuff repair, right carpal tunnel release, L4-5 fusion 2005.   OT comments  This 81 yo female admitted with above seen today to have her work on dressing herself. She was able to do this with S/VCs for technique. No further OT needs, we will sign off.   Recommendations for follow up therapy are one component of a multi-disciplinary discharge planning process, led by the attending physician.  Recommendations may be updated based on patient status, additional functional criteria and insurance authorization.    Follow Up Recommendations  No OT follow up    Assistance Recommended at Discharge Frequent or constant Supervision/Assistance  Patient can return home with the following  A little help with bathing/dressing/bathroom;A little help with walking and/or transfers;Assistance with cooking/housework;Help with stairs or ramp for entrance;Assist for transportation   Equipment Recommendations  None recommended by OT       Precautions / Restrictions Precautions Precautions: Cervical Precaution Booklet Issued: Yes (comment) Precaution Comments: pt able to verbalize cervical precautions Required Braces or Orthoses: Cervical Brace Cervical Brace: Hard collar (off in bed and showering; hard or soft collar on when up and about per pt and her husband in room) Restrictions Weight Bearing Restrictions: No       Mobility Bed Mobility Overal bed mobility: Needs Assistance Bed Mobility: Supine to Sit          General bed mobility comments: pt plans on sitting and sleeping in an electric recliner (not a lift chair)    Transfers Overall transfer level: Needs assistance Equipment used: Rolling walker (2  wheels) Transfers: Sit to/from Stand Sit to Stand: Min guard                 Balance Overall balance assessment: Needs assistance Sitting-balance support: No upper extremity supported Sitting balance-Leahy Scale: Good     Standing balance support: Bilateral upper extremity supported Standing balance-Leahy Scale: Poor                             ADL either performed or assessed with clinical judgement   ADL Overall ADL's : Needs assistance/impaired                 Upper Body Dressing : Supervision/safety;Sitting Upper Body Dressing Details (indicate cue type and reason): Cues to put head in first then arms                        Extremity/Trunk Assessment Upper Extremity Assessment Upper Extremity Assessment: Defer to OT evaluation LUE Deficits / Details: Decreased AROM shoulder (old rotator cuff repair)       Vision Baseline Vision/History: 1 Wears glasses (reading) Patient Visual Report: No change from baseline            Cognition Arousal/Alertness: Awake/alert Behavior During Therapy: WFL for tasks assessed/performed                                   General Comments: reports short term memory deficits              General Comments Husband present--orthopedic Psychologist, sport and exercise and clearly familiar  with education being provided to wife. (Important as she admits to short-term memory deficits)    Pertinent Vitals/ Pain       Pain Assessment Pain Assessment: No/denies pain  Home Living Family/patient expects to be discharged to:: Private residence Living Arrangements: Spouse/significant other;Children Available Help at Discharge: Family;Available PRN/intermittently Type of Home: House Home Access: Stairs to enter [employer] of Steps: 3 Entrance Stairs-Rails: Right Home Layout: Two level;Able to live on main level with bedroom/bathroom     Bathroom Shower/Tub: Hospital doctor Toilet:  Handicapped height     Home Equipment: Conservation officer, nature (2 wheels);Shower seat - built in;Grab bars - toilet;Grab bars - tub/shower;Hand held shower head              Frequency  Min 2X/week        Progress Toward Goals  OT Goals(current goals can now be found in the care plan section)   Making progress, education completed.  Acute Rehab OT Goals Patient Stated Goal: to go home today OT Goal Formulation: With patient Time For Goal Achievement: 07/01/22 Potential to Achieve Goals: Good  Plan         AM-PAC OT "6 Clicks" Daily Activity     Outcome Measure   Help from another person eating meals?: A Little Help from another person taking care of personal grooming?: A Little Help from another person toileting, which includes using toliet, bedpan, or urinal?: A Little Help from another person bathing (including washing, rinsing, drying)?: A Little Help from another person to put on and taking off regular upper body clothing?: A Little Help from another person to put on and taking off regular lower body clothing?: A Little 6 Click Score: 18    End of Session    OT Visit Diagnosis: Unsteadiness on feet (R26.81);Other abnormalities of gait and mobility (R26.89)   Activity Tolerance Patient tolerated treatment well   Patient Left  (sitting EOB getting ready to work with PT)           Time: 2263-3354 OT Time Calculation (min): 34 min  Charges: OT General Charges $OT Visit: 1 Visit OT Treatments $Self Care/Home Management : 23-37 mins  Golden Circle, OTR/L Acute Rehab Services [facility] [phone] Office [phone]    Almon Register 06/18/2022, 9:51 AM

## 2022-06-18 NOTE — Discharge Summary (Signed)
Patient ID: Theresa Holmes MRN: [id number] DOB/AGE: September 28, 1941 81 y.o.  Admit date: 06/17/2022 Discharge date: 06/18/2022  Admission Diagnoses:  Principal Problem:   Cervical disc herniation   Discharge Diagnoses:  Principal Problem:   Cervical disc herniation  status post Procedure(s): ANTERIOR CERVICAL DISCECTOMY AND FUSION CERVICAL FIVE TO SEVEN  Past Medical History:  Diagnosis Date   Arthritis    Atypical chest pain 10/22/2016   Chronic back pain    Exogenous obesity    GERD (gastroesophageal reflux disease)    H/O hiatal hernia    History of kidney stones    Hypercholesterolemia    Hyperlipidemia    Hypertension    12/14/2018 pt. denies   Hypothyroidism    HX PARTIAL THYROIDECTOMY FOR  NODULE- BENIGN   Shingles 11/2012   Spinal stenosis    Thyroid mass    right, benign   Varicose vein     Surgeries: Procedure(s): ANTERIOR CERVICAL DISCECTOMY AND FUSION CERVICAL FIVE TO SEVEN on 06/17/2022   Consultants:   Discharged Condition: Improved  Hospital Course: Theresa Holmes is an 81 y.o. female who was admitted 06/17/2022 for operative treatment of Cervical disc herniation. Patient failed conservative treatments (please see the history and physical for the specifics) and had severe unremitting pain that affects sleep, daily activities and work/hobbies. After pre-op clearance, the patient was taken to the operating room on 06/17/2022 and underwent  Procedure(s): ANTERIOR CERVICAL DISCECTOMY AND FUSION CERVICAL FIVE TO SEVEN.    Patient was given perioperative antibiotics:  Anti-infectives (From admission, onward)    Start     Dose/Rate Route Frequency Ordered Stop   06/17/22 1345  ceFAZolin (ANCEF) IVPB 1 g/50 mL premix        1 g 100 mL/hr over 30 Minutes Intravenous Every 8 hours 06/17/22 1251 06/17/22 2122   06/17/22 0603  ceFAZolin (ANCEF) IVPB 2g/100 mL premix        2 g 200 mL/hr over 30 Minutes Intravenous 30 min pre-op 06/17/22 0603 06/17/22 0747         Patient was given sequential compression devices and early ambulation to prevent DVT.   Patient benefited maximally from hospital stay and there were no complications. At the time of discharge, the patient was urinating/moving their bowels without difficulty, tolerating a regular diet, pain is controlled with oral pain medications and they have been cleared by PT/OT.   Recent vital signs: Patient Vitals for the past 24 hrs:  BP Temp Temp src Pulse Resp SpO2  06/18/22 0415 98/61 97.8 F (36.6 C) Oral 75 18 99 %  06/17/22 2300 127/63 97.7 F (36.5 C) Oral 65 18 96 %  06/17/22 1957 127/70 98.5 F (36.9 C) Oral 77 18 99 %  06/17/22 1721 103/63 97.7 F (36.5 C) Oral 73 18 97 %  06/17/22 1303 (!) 164/78 -- -- 82 20 97 %  06/17/22 1230 (!) 121/43 97.7 F (36.5 C) -- 86 20 97 %  06/17/22 1215 132/81 -- -- 78 (!) 9 97 %  06/17/22 1200 134/72 -- -- 86 (!) 21 95 %  06/17/22 1145 126/63 -- -- 80 11 94 %  06/17/22 1130 (!) 132/121 -- -- 87 (!) 22 94 %  06/17/22 1115 130/63 -- -- 82 18 94 %  06/17/22 1113 132/61 98.1 F (36.7 C) -- 84 20 96 %     Recent laboratory studies: No results for input(s): "WBC", "HGB", "HCT", "PLT", "NA", "K", "CL", "CO2", "BUN", "CREATININE", "GLUCOSE", "INR", "CALCIUM" in the  last 72 hours.  Invalid input(s): "PT", "2"   Discharge Medications:   Allergies as of 06/18/2022   No Known Allergies      Medication List     STOP taking these medications    mirabegron ER 25 MG Tb24 tablet Commonly known as: Myrbetriq       TAKE these medications    AZO TABS PO Take 1 tablet by mouth daily.   B-12 PO Take 1 tablet by mouth daily.   Biotin 10 MG Caps Take 10 mg by mouth daily.   esomeprazole 40 MG capsule Commonly known as: NEXIUM Take 40 mg by mouth daily.   levothyroxine 137 MCG tablet Commonly known as: SYNTHROID Take 137 mcg by mouth daily before breakfast.   methocarbamol 500 MG tablet Commonly known as: ROBAXIN Take 1 tablet (500 mg  total) by mouth every 8 (eight) hours as needed for up to 5 days for muscle spasms.   ondansetron 4 MG tablet Commonly known as: Zofran Take 1 tablet (4 mg total) by mouth every 8 (eight) hours as needed for nausea or vomiting.   oxyCODONE-acetaminophen 10-325 MG tablet Commonly known as: Percocet Take 1 tablet by mouth every 6 (six) hours as needed for up to 5 days for pain.   PROBIOTIC-PREBIOTIC PO Take 1 tablet by mouth daily.   Vitamin D 50 MCG (2000 UT) Caps Take 2,000 Units by mouth daily.        Diagnostic Studies: DG Cervical Spine 2 or 3 views  Result Date: 06/17/2022 CLINICAL DATA:  C5-7 ACDF EXAM: CERVICAL SPINE - 2-3 VIEW COMPARISON:  None Available. FINDINGS: Three C-arm fluoroscopic images were obtained intraoperatively and submitted for post operative interpretation. Images demonstrate ACDF hardware at the C5-C7 levels. 11 seconds fluoroscopy time utilized. Radiation dose: 5.12 mGy. Please see the performing provider's procedural report for further detail. IMPRESSION: ACDF hardware at the C5-C7 levels. Electronically Signed   By: Davina Poke D.O.   On: 06/17/2022 10:57   DG C-Arm 1-60 Min-No Report  Result Date: 06/17/2022 Fluoroscopy was utilized by the requesting physician.  No radiographic interpretation.   DG C-Arm 1-60 Min-No Report  Result Date: 06/17/2022 Fluoroscopy was utilized by the requesting physician.  No radiographic interpretation.   DG C-Arm 1-60 Min-No Report  Result Date: 06/17/2022 Fluoroscopy was utilized by the requesting physician.  No radiographic interpretation.    Discharge Instructions     Incentive spirometry RT   Complete by: As directed         Follow-up Information     Melina Schools, MD Follow up in 2 week(s).   Specialty: Orthopedic Surgery Why: If symptoms worsen, For suture removal, For wound re-check Contact information: [address] [place] [address] [phone]                  Discharge Plan:  discharge to home  Disposition: Patient is doing quite well.  She is tolerating a regular diet and her pain is well controlled with oral medications.  The incision is healing well there is no signs of infection, or swelling.  She has no shortness of breath or difficulty breathing.  At this point time patient is neurologically intact and has cleared with PT.  She will be discharged home with a appropriate instructions and medications for pain, muscle spasms, and nausea.  She will follow-up with me in 2 weeks as scheduled.    Signed: Dahlia Bailiff for Dr. Melina Schools [facility] [phone] 06/18/2022,  7:32 AM

## 2022-07-15 ENCOUNTER — Ambulatory Visit: Payer: No Typology Code available for payment source | Admitting: Plastic Surgery

## 2022-07-15 ENCOUNTER — Encounter: Payer: Self-pay | Admitting: Plastic Surgery

## 2022-07-15 VITALS — BP 124/90 | HR 98 | Wt 168.4 lb

## 2022-07-15 DIAGNOSIS — T8543XA Leakage of breast prosthesis and implant, initial encounter: Secondary | ICD-10-CM | POA: Diagnosis not present

## 2022-07-15 DIAGNOSIS — M5020 Other cervical disc displacement, unspecified cervical region: Secondary | ICD-10-CM

## 2022-07-15 DIAGNOSIS — Z719 Counseling, unspecified: Secondary | ICD-10-CM

## 2022-07-15 NOTE — Progress Notes (Unsigned)
Patient ID: Theresa Holmes, female    DOB: 1941-05-14, 81 y.o.   MRN: [id number]   Chief Complaint  Patient presents with   Advice Only   Breast Problem    The patient is a 81 year old female here for evaluation of her breasts.  She underwent cervical spine surgery 3 weeks ago.  At the time she thinks that the strap that was put around her may have caused an issue with the implant.  She has silicone implants in.  She is not sure of the location or size.  They have been in since the 1970s from [place]. She has grade III ptosis.  By exam she has firm implants with likely calcification around the implant.  I think that they are both ruptured.  She had a mammogram a few months ago which was negative for breast pathology.  No other issues noted.    Review of Systems  Constitutional: Negative.   HENT: Negative.    Eyes: Negative.   Respiratory: Negative.  Negative for chest tightness and shortness of breath.   Cardiovascular: Negative.   Gastrointestinal: Negative.   Endocrine: Negative.   Genitourinary: Negative.     Past Medical History:  Diagnosis Date   Arthritis    Atypical chest pain 10/22/2016   Chronic back pain    Exogenous obesity    GERD (gastroesophageal reflux disease)    H/O hiatal hernia    History of kidney stones    Hypercholesterolemia    Hyperlipidemia    Hypertension    12/14/2018 pt. denies   Hypothyroidism    HX PARTIAL THYROIDECTOMY FOR  NODULE- BENIGN   Shingles 11/2012   Spinal stenosis    Thyroid mass    right, benign   Varicose vein     Past Surgical History:  Procedure Laterality Date   ABDOMINAL HYSTERECTOMY  1985   ANTERIOR CERVICAL DECOMP/DISCECTOMY FUSION N/A 06/17/2022   Procedure: ANTERIOR CERVICAL DISCECTOMY AND FUSION CERVICAL FIVE TO SEVEN;  Surgeon: Melina Schools, MD;  Location: [facility];  Service: Orthopedics;  Laterality: N/A;  3.5 hrs 3 C-bed   ANTERIOR LAT LUMBAR FUSION N/A 12/16/2018   Procedure: ANTERIOR LATERAL LUMBAR FUSION L  3-4 with lateral plate;  Surgeon: Melina Schools, MD;  Location: [facility];  Service: Orthopedics;  Laterality: N/A;  3 1/2 hours   APPENDECTOMY     BREAST BIOPSY  9/05   stereotactic   CARPAL TUNNEL RELEASE  12/15   right wrist   CHOLECYSTECTOMY     COMBINED AUGMENTATION MAMMAPLASTY AND ABDOMINOPLASTY  1977   EYE SURGERY  12/05   retinal tear-left   HAMMER TOE SURGERY  2002   HERNIA REPAIR  2004   HIATAL HERNIA REPAIR N/A 01/21/2014   Procedure: LAPAROSCOPIC REPAIR RECURRENT  HIATAL HERNIA REDO NISSEN, upper endoscopy;  Surgeon: Pedro Earls, MD;  Location: [facility];  Service: General;  Laterality: N/A;   KIDNEY STONE SURGERY  6967   NISSEN FUNDOPLICATION     with cholecystectomy   ROTATOR CUFF REPAIR  2001   ROTATOR CUFF REPAIR  1/12    left   ROTATOR CUFF REPAIR  5/13    open   SPINAL FUSION  2005   L4  & L5   THYROIDECTOMY, PARTIAL     TONSILLECTOMY     TONSILLECTOMY AND ADENOIDECTOMY     as a child   UPPER ENDOSCOPY W/ [place]  Current Outpatient Medications:    Bacillus Coagulans-Inulin (PROBIOTIC-PREBIOTIC PO), Take 1 tablet by mouth daily., Disp: , Rfl:    Biotin 10 MG CAPS, Take 10 mg by mouth daily., Disp: , Rfl:    Cholecalciferol (VITAMIN D) 50 MCG (2000 UT) CAPS, Take 2,000 Units by mouth daily., Disp: , Rfl:    Cyanocobalamin (B-12 PO), Take 1 tablet by mouth daily., Disp: , Rfl:    esomeprazole (NEXIUM) 40 MG capsule, Take 40 mg by mouth daily., Disp: , Rfl:    levothyroxine (SYNTHROID, LEVOTHROID) 137 MCG tablet, Take 137 mcg by mouth daily before breakfast., Disp: , Rfl:    ondansetron (ZOFRAN) 4 MG tablet, Take 1 tablet (4 mg total) by mouth every 8 (eight) hours as needed for nausea or vomiting., Disp: 20 tablet, Rfl: 0   Phenazopyridine HCl (AZO TABS PO), Take 1 tablet by mouth daily., Disp: , Rfl:    Objective:   Vitals:   07/15/22 1445  BP: (!) 124/90  Pulse: 98  SpO2: 95%    Physical Exam Vitals  reviewed.  Constitutional:      Appearance: Normal appearance.  HENT:     Head: Normocephalic and atraumatic.  Cardiovascular:     Rate and Rhythm: Normal rate.     Pulses: Normal pulses.  Pulmonary:     Effort: Pulmonary effort is normal.  Abdominal:     General: There is no distension.     Palpations: Abdomen is soft.     Tenderness: There is no abdominal tenderness.  Skin:    Capillary Refill: Capillary refill takes less than 2 seconds.     Coloration: Skin is not jaundiced.     Findings: No bruising or lesion.  Neurological:     Mental Status: She is alert and oriented to person, place, and time.  Psychiatric:        Mood and Affect: Mood normal.        Behavior: Behavior normal.        Thought Content: Thought content normal.     Assessment & Plan:  Cervical disc herniation  Encounter for counseling  Rupture of implant of left breast, initial encounter  Recommend removal of bilateral mastopexy.  Pictures were obtained of the patient and placed in the chart with the patient's or guardian's permission.   [place], DO

## 2022-07-16 ENCOUNTER — Telehealth: Payer: Self-pay | Admitting: *Deleted

## 2022-07-16 NOTE — Telephone Encounter (Signed)
[employer] auth submitted via Phone w/ rep Lilla Shook.   Pending auth : [id number]  Faxed our clinical: This message was sent via [place], a product from [employer] [email or web address].[email or web address]/                    -------Fax Transmission Report-------  To:               Recipient at [phone] Subject:          [Secure] Outpatient Surgery Auth Request - [place] Result:           The transmission was successful. Explanation:      All Pages Ok Pages Sent:       9 Connect Time:     7 minutes, 2 seconds Transmit Time:    07/16/2022 09:50 Transfer Rate:    14400 Status Code:      0000 Retry Count:      0 Job Id:           [id number] Unique Id:        [id number] Fax Line:         20 Fax Server:       [employer]

## 2022-07-18 ENCOUNTER — Encounter: Payer: Self-pay | Admitting: *Deleted

## 2022-07-22 ENCOUNTER — Ambulatory Visit (INDEPENDENT_AMBULATORY_CARE_PROVIDER_SITE_OTHER): Payer: No Typology Code available for payment source | Admitting: Physician Assistant

## 2022-07-22 ENCOUNTER — Encounter: Payer: Self-pay | Admitting: Physician Assistant

## 2022-07-22 VITALS — BP 146/82 | HR 69 | Ht 62.0 in | Wt 168.0 lb

## 2022-07-22 DIAGNOSIS — T8543XA Leakage of breast prosthesis and implant, initial encounter: Secondary | ICD-10-CM

## 2022-07-22 MED ORDER — CEPHALEXIN 500 MG PO CAPS
500.0000 mg | ORAL_CAPSULE | Freq: Three times a day (TID) | ORAL | 0 refills | Status: DC
Start: 2022-07-22 — End: 2022-08-20

## 2022-07-22 MED ORDER — OXYCODONE-ACETAMINOPHEN 5-325 MG PO TABS: 1.0000 | ORAL_TABLET | Freq: Four times a day (QID) | ORAL | 0 refills | Status: DC | PRN

## 2022-07-22 NOTE — Progress Notes (Signed)
Patient ID: Theresa Holmes, female    DOB: July 07, 1941, 81 y.o.   MRN: [id number]  Chief Complaint  Patient presents with   Pre-op Exam    No diagnosis found.   History of Present Illness: Theresa Holmes is a 81 y.o.  female  with a history of ruptured bilateral implants.  She presents for preoperative evaluation for upcoming procedure, bilateral implant exchange with mastopexy, scheduled for 07/24/2022 with Dr. Marla Roe.  The patient has not had problems with anesthesia.   Summary of Previous Visit: The patient was most recently seen in the office on 07/15/2022 by Dr. Marla Roe.  She underwent cervical spine surgery approximately 3 weeks ago at that time she feels that her left implant has ruptured given pain at the left breast.  Dr. Marla Roe evaluated the patient, she had firm implants with likely calcification around them high suspicion for rupture.  She had her implants placed in the 1970s in [place].  Job: Retired  [employer] Significant for: Bilateral breast implants   Past Medical History: Allergies: No Known Allergies  Current Medications:  Current Outpatient Medications:    Bacillus Coagulans-Inulin (PROBIOTIC-PREBIOTIC PO), Take 1 tablet by mouth daily., Disp: , Rfl:    Biotin 10 MG CAPS, Take 10 mg by mouth daily., Disp: , Rfl:    Cholecalciferol (VITAMIN D) 50 MCG (2000 UT) CAPS, Take 2,000 Units by mouth daily., Disp: , Rfl:    Cyanocobalamin (B-12 PO), Take 1 tablet by mouth daily., Disp: , Rfl:    esomeprazole (NEXIUM) 40 MG capsule, Take 40 mg by mouth daily., Disp: , Rfl:    levothyroxine (SYNTHROID, LEVOTHROID) 137 MCG tablet, Take 137 mcg by mouth daily before breakfast., Disp: , Rfl:    ondansetron (ZOFRAN) 4 MG tablet, Take 1 tablet (4 mg total) by mouth every 8 (eight) hours as needed for nausea or vomiting., Disp: 20 tablet, Rfl: 0   Phenazopyridine HCl (AZO TABS PO), Take 1 tablet by mouth daily., Disp: , Rfl:   Past Medical Problems: Past Medical  History:  Diagnosis Date   Arthritis    Atypical chest pain 10/22/2016   Chronic back pain    Exogenous obesity    GERD (gastroesophageal reflux disease)    H/O hiatal hernia    History of kidney stones    Hypercholesterolemia    Hyperlipidemia    Hypertension    12/14/2018 pt. denies   Hypothyroidism    HX PARTIAL THYROIDECTOMY FOR  NODULE- BENIGN   Shingles 11/2012   Spinal stenosis    Thyroid mass    right, benign   Varicose vein     Past Surgical History: Past Surgical History:  Procedure Laterality Date   ABDOMINAL HYSTERECTOMY  1985   ANTERIOR CERVICAL DECOMP/DISCECTOMY FUSION N/A 06/17/2022   Procedure: ANTERIOR CERVICAL DISCECTOMY AND FUSION CERVICAL FIVE TO SEVEN;  Surgeon: Melina Schools, MD;  Location: [facility];  Service: Orthopedics;  Laterality: N/A;  3.5 hrs 3 C-bed   ANTERIOR LAT LUMBAR FUSION N/A 12/16/2018   Procedure: ANTERIOR LATERAL LUMBAR FUSION L 3-4 with lateral plate;  Surgeon: Melina Schools, MD;  Location: [place] [facility];  Service: Orthopedics;  Laterality: N/A;  3 1/2 hours   APPENDECTOMY     BREAST BIOPSY  9/05   stereotactic   CARPAL TUNNEL RELEASE  12/15   right wrist   CHOLECYSTECTOMY     COMBINED AUGMENTATION MAMMAPLASTY AND ABDOMINOPLASTY  1977   EYE SURGERY  12/05   retinal tear-left   HAMMER TOE  SURGERY  2002   HERNIA REPAIR  2004   HIATAL HERNIA REPAIR N/A 01/21/2014   Procedure: LAPAROSCOPIC REPAIR RECURRENT  HIATAL HERNIA REDO NISSEN, upper endoscopy;  Surgeon: Pedro Earls, MD;  Location: [facility];  Service: General;  Laterality: N/A;   KIDNEY STONE SURGERY  5093   NISSEN FUNDOPLICATION     with cholecystectomy   ROTATOR CUFF REPAIR  2001   ROTATOR CUFF REPAIR  1/12    left   ROTATOR CUFF REPAIR  5/13    open   SPINAL FUSION  2005   L4  & L5   THYROIDECTOMY, PARTIAL     TONSILLECTOMY     TONSILLECTOMY AND ADENOIDECTOMY     as a child   UPPER ENDOSCOPY W/ SCLEROTHERAPY  2000   [place]    Social History: Social  History   Socioeconomic History   Marital status: Married    Spouse name: Not on file   Number of children: Not on file   Years of education: Not on file   Highest education level: Not on file  Occupational History   Not on file  Tobacco Use   Smoking status: Never   Smokeless tobacco: Never   Tobacco comments:    teenager experiment with cigarette  Vaping Use   Vaping Use: Never used  Substance and Sexual Activity   Alcohol use: Yes    Alcohol/week: 1.0 standard drink of alcohol    Types: 1 Glasses of wine per week   Drug use: No   Sexual activity: Not Currently    Partners: Male    Birth control/protection: Surgical    Comment: [facility]  Other Topics Concern   Not on file  Social History Narrative   Not on file   Social Determinants of Health   Financial Resource Strain: Not on file  Food Insecurity: Not on file  Transportation Needs: Not on file  Physical Activity: Not on file  Stress: Not on file  Social Connections: Not on file  Intimate Partner Violence: Not on file    Family History: Family History  Problem Relation Age of Onset   Pulmonary embolism Mother        amniotic fluid embolism   Kidney failure Father        complete shut down   Heart failure Father    Breast cancer Maternal Grandmother 36   Alzheimer's disease Cousin     Review of Systems: ROS  Physical Exam: Vital Signs BP (!) 146/82 (BP Location: Left Arm, Patient Position: Sitting, Cuff Size: Large)   Pulse 69   Ht '5\' 2"'$  (1.575 m)   Wt 168 lb (76.2 kg)   LMP 11/26/1983   SpO2 98%   BMI 30.73 kg/m   Physical Exam  Constitutional:      General: Not in acute distress.    Appearance: Normal appearance. Not ill-appearing.  HENT:     Head: Normocephalic and atraumatic.  Cardiovascular:     Rate and Rhythm: Normal rate.  Pulmonary:     Effort: No respiratory distress or increased work of breathing.  Speaks in full sentences. Abdominal:     General: Abdomen is flat. No distension.    Musculoskeletal: Normal range of motion. No lower extremity swelling or edema. No varicosities.  Skin:    General: Skin is warm and dry.     Findings: No erythema or rash.  Neurological:     Mental Status: Alert and oriented to person, place, and  time.  Psychiatric:        Mood and Affect: Mood normal.        Behavior: Behavior normal.    Assessment/Plan: The patient is scheduled for bilateral breast implant exchange with mastopexy with Dr. Marla Roe.  Risks, benefits, and alternatives of procedure discussed, questions answered and consent obtained.    Smoking Status: Non-smoker Last Mammogram: March 2023; Results: No evidence of malignancy  Caprini Score: 7; Risk Factors include: Age, BMI, history of varicose veins, major surgery, recent major procedure  Recommendation for mechanical, will discuss options with Dr. Marla Roe including encourage early ambulation versus anticoagulation  Pictures obtained: Previous visit  Post-op Rx sent to pharmacy: Keflex, Percocet  Patient was provided with the  General Surgical Risk consent document and Pain Medication Agreement prior to their appointment.  They had adequate time to read through the risk consent documents and Pain Medication Agreement. We also discussed them in person together during this preop appointment. All of their questions were answered to their satisfaction.  Recommended calling if they have any further questions.  Risk consent form and Pain Medication Agreement to be scanned into patient's chart.     Electronically signed by: Stevie Kern Zelena Bushong, PA-C 07/22/2022 2:02 PM

## 2022-07-24 DIAGNOSIS — Z411 Encounter for cosmetic surgery: Secondary | ICD-10-CM

## 2022-08-02 ENCOUNTER — Encounter: Payer: Self-pay | Admitting: Plastic Surgery

## 2022-08-02 ENCOUNTER — Ambulatory Visit (INDEPENDENT_AMBULATORY_CARE_PROVIDER_SITE_OTHER): Payer: Self-pay | Admitting: Plastic Surgery

## 2022-08-02 DIAGNOSIS — Z719 Counseling, unspecified: Secondary | ICD-10-CM

## 2022-08-02 NOTE — Progress Notes (Signed)
The patient is an 81 year old female here for follow-up after undergoing removal and replacement of the implants with a mastopexy.  Overall she is doing really well.  There is no sign of a hematoma or seroma.  She has a little more swelling of the left breast but overall it looks good and no areas for concern.  She should continue with either the sports bra or the binder.  I would like to see her back in 1 to 2 weeks.

## 2022-08-07 ENCOUNTER — Encounter: Payer: No Typology Code available for payment source | Admitting: Student

## 2022-08-20 ENCOUNTER — Ambulatory Visit (INDEPENDENT_AMBULATORY_CARE_PROVIDER_SITE_OTHER): Payer: Self-pay | Admitting: Plastic Surgery

## 2022-08-20 ENCOUNTER — Encounter: Payer: Self-pay | Admitting: Plastic Surgery

## 2022-08-20 DIAGNOSIS — Z719 Counseling, unspecified: Secondary | ICD-10-CM

## 2022-08-20 NOTE — Progress Notes (Signed)
The patient is an 81 year old female here for follow-up after undergoing removal replacement of the implants with mastopexy.  Overall she is doing really well.  There is no sign of a hematoma or seroma.  She removed all the Steri-Strips and dressing.  I went ahead and put a few more Steri-Strips on to help with shaping of her lower breast area.  I would like to see her back in 3 weeks.  She is having some neck and shoulder pain.  I recommend Voltaren for that.  No pain of her breast since surgery.  Pictures were obtained of the patient and placed in the chart with the patient's or guardian's permission.

## 2022-08-26 ENCOUNTER — Encounter: Payer: No Typology Code available for payment source | Admitting: Student

## 2022-08-27 ENCOUNTER — Ambulatory Visit: Payer: No Typology Code available for payment source | Admitting: Psychology

## 2022-09-05 ENCOUNTER — Ambulatory Visit: Payer: No Typology Code available for payment source | Admitting: Psychology

## 2022-09-10 ENCOUNTER — Ambulatory Visit (INDEPENDENT_AMBULATORY_CARE_PROVIDER_SITE_OTHER): Payer: Self-pay | Admitting: Student

## 2022-09-10 DIAGNOSIS — Z719 Counseling, unspecified: Secondary | ICD-10-CM

## 2022-09-10 NOTE — Progress Notes (Signed)
Patient is an 81 year old female who underwent removal of bilateral breast implants and replacement of the breast implants on 07/24/2022 with Dr. Marla Roe.  Patient had Mentor smooth round high-profile saline 260 cc implants placed bilaterally intraoperatively.  Patient presents today for postoperative follow-up.  Patient was last seen in the clinic on 08/20/2022.  At this visit, she overall is doing well.  There is no sign of hematoma or seroma.  Plan is for patient to follow-up in 3 weeks.  Today, patient reports she is doing well.  She states that she has left the Steri-Strips on in place since her last visit.  She denies any issues or concerns at this time.  She reports overall she is happy with her result.  Chaperone present on exam.  On exam, patient is sitting upright in no acute distress.  Implants are in place bilaterally.  Breasts are soft and fairly symmetric bilaterally.  Steri-Strips were removed from the incisions.  Incisions appear to be healing well.  There is some residual Dermabond noted to some of the incisions.  There are no wounds noted.  There is no overlying erythema or ecchymosis to either breast.  Dr. Marla Roe also had the opportunity to examine the patient today.  I discussed with the patient that she may transition out of a compression bra at this time into a regular bra without underwire.  I discussed with the patient that if she wants to apply a little bit of Vaseline to her incisions to help with the residual Dermabond.  Patient expressed understanding.  Patient is to follow-up in 1 year.  Patient can follow-up as needed in the meantime.  I instructed the patient to call if she has any questions or concerns.  Pictures were obtained of the patient and placed in the chart with the patient's or guardian's permission.

## 2022-10-23 NOTE — Telephone Encounter (Signed)
Error

## 2022-12-06 ENCOUNTER — Other Ambulatory Visit: Payer: Self-pay | Admitting: Neurological Surgery

## 2022-12-06 DIAGNOSIS — M5412 Radiculopathy, cervical region: Secondary | ICD-10-CM

## 2023-01-08 ENCOUNTER — Ambulatory Visit
Admission: RE | Admit: 2023-01-08 | Discharge: 2023-01-08 | Disposition: A | Discharge status: Home / Self Care | Payer: No Typology Code available for payment source | Source: Ambulatory Visit | Attending: Neurological Surgery | Admitting: Neurological Surgery

## 2023-01-08 DIAGNOSIS — M5412 Radiculopathy, cervical region: Secondary | ICD-10-CM

## 2023-03-05 ENCOUNTER — Encounter: Payer: No Typology Code available for payment source | Admitting: Psychology

## 2023-05-14 ENCOUNTER — Encounter (INDEPENDENT_AMBULATORY_CARE_PROVIDER_SITE_OTHER): Payer: Medicare Other | Admitting: Ophthalmology

## 2023-09-09 ENCOUNTER — Ambulatory Visit: Payer: No Typology Code available for payment source | Admitting: Plastic Surgery

## 2023-09-23 ENCOUNTER — Ambulatory Visit: Payer: No Typology Code available for payment source | Admitting: Plastic Surgery

## 2023-10-01 ENCOUNTER — Other Ambulatory Visit: Payer: Self-pay | Admitting: Neurological Surgery

## 2023-10-01 DIAGNOSIS — R053 Chronic cough: Secondary | ICD-10-CM

## 2023-10-01 DIAGNOSIS — M5416 Radiculopathy, lumbar region: Secondary | ICD-10-CM

## 2023-10-02 ENCOUNTER — Encounter: Payer: Self-pay | Admitting: Neurological Surgery

## 2023-10-03 ENCOUNTER — Ambulatory Visit
Admission: RE | Admit: 2023-10-03 | Discharge: 2023-10-03 | Disposition: A | Discharge status: Home / Self Care | Payer: No Typology Code available for payment source | Source: Ambulatory Visit | Attending: Neurological Surgery | Admitting: Neurological Surgery

## 2023-10-03 DIAGNOSIS — M5416 Radiculopathy, lumbar region: Secondary | ICD-10-CM

## 2023-10-03 MED ORDER — GADOPICLENOL 0.5 MMOL/ML IV SOLN
8.0000 mL | Freq: Once | INTRAVENOUS | Status: AC | PRN
  Administered 2023-10-03: 8 mL via INTRAVENOUS

## 2023-10-06 ENCOUNTER — Encounter (HOSPITAL_COMMUNITY): Payer: Self-pay | Admitting: Neurological Surgery

## 2023-10-06 ENCOUNTER — Other Ambulatory Visit: Payer: Self-pay

## 2023-10-06 ENCOUNTER — Other Ambulatory Visit: Payer: Self-pay | Admitting: Neurological Surgery

## 2023-10-06 NOTE — Anesthesia Preprocedure Evaluation (Addendum)
Anesthesia Evaluation  Patient identified by MRN, date of birth, ID band Patient awake    Reviewed: Allergy & Precautions, NPO status , Patient's Chart, lab work & pertinent test results  Airway Mallampati: II  TM Distance: >3 FB Neck ROM: Full    Dental no notable dental hx.    Pulmonary neg pulmonary ROS   Pulmonary exam normal        Cardiovascular hypertension, Pt. on medications  Rhythm:Regular Rate:Normal     Neuro/Psych  Headaches  negative psych ROS   GI/Hepatic Neg liver ROS, hiatal hernia,GERD  Medicated,,  Endo/Other  Hypothyroidism    Renal/GU negative Renal ROS     Musculoskeletal   Abdominal Normal abdominal exam  (+)   Peds  Hematology Lab Results      Component                Value               Date                      WBC                      7.5                 06/11/2022                HGB                      12.2                06/11/2022                HCT                      39.3                06/11/2022                MCV                      84.3                06/11/2022                PLT                      357                 06/11/2022             Lab Results      Component                Value               Date                      NA                       142                 06/11/2022                K                        3.9  06/11/2022                CO2                      29                  06/11/2022                GLUCOSE                  97                  06/11/2022                BUN                      21                  06/11/2022                CREATININE               0.75                06/11/2022                CALCIUM                  8.8 (L)             06/11/2022                GFR                      99.25               02/04/2012                EGFR                     71                  10/30/2021                GFRNONAA                  >60                 06/11/2022              Anesthesia Other Findings   Reproductive/Obstetrics                             Anesthesia Physical Anesthesia Plan  ASA: 3  Anesthesia Plan: General   Post-op Pain Management: Celebrex PO (pre-op)* and Tylenol PO (pre-op)*   Induction: Intravenous  PONV Risk Score and Plan: 3 and Ondansetron, Dexamethasone and Treatment may vary due to age or medical condition  Airway Management Planned: Mask, Oral ETT and Video Laryngoscope Planned  Additional Equipment: None  Intra-op Plan:   Post-operative Plan: Extubation in OR  Informed Consent: I have reviewed the patients History and Physical, chart, labs and discussed the procedure including the risks, benefits and alternatives for the proposed anesthesia with the patient or authorized representative who has indicated his/her understanding and acceptance.     Dental advisory given  Plan Discussed with: CRNA  Anesthesia Plan Comments:  Anesthesia Quick Evaluation

## 2023-10-06 NOTE — Progress Notes (Signed)
SDW call  Patient was given pre-op instructions over the phone. Patient verbalized understanding of instructions provided.     PCP - Dr. Adrian Prince Cardiologist -  Pulmonary:    PPM/ICD - denies Device Orders - na Rep Notified - na   Chest x-ray - na EKG -  DOS 10/07/2023 Stress Test -10/30/2016 ECHO -  Cardiac Cath -   Sleep Study/sleep apnea/CPAP: denies  Non-diabetic  Blood Thinner Instructions: denies Aspirin Instructions:denies   ERAS Protcol - NPO   COVID TEST- na    Anesthesia review: No   Patient denies shortness of breath, fever, cough and chest pain over the phone call  Your procedure is scheduled on Tuesday October 07, 2023  Report to [facility] Main Entrance "A" at  0530  A.M., then check in with the Admitting office.  Call this number if you have problems the morning of surgery:  [phone]   If you have any questions prior to your surgery date call [phone]: Open Monday-Friday 8am-4pm If you experience any cold or flu symptoms such as cough, fever, chills, shortness of breath, etc. between now and your scheduled surgery, please notify us at the above number     Remember:  Do not eat or drink after midnight the night before your surgery  Take these medicines the morning of surgery with A SIP OF WATER:  Nexium, synthroid  As of today, STOP taking any Aspirin (unless otherwise instructed by your surgeon) Aleve, Naproxen, Ibuprofen, Motrin, Advil, Goody's, BC's, all herbal medications, fish oil, and all vitamins.

## 2023-10-07 ENCOUNTER — Inpatient Hospital Stay (HOSPITAL_COMMUNITY): Payer: No Typology Code available for payment source

## 2023-10-07 ENCOUNTER — Encounter (HOSPITAL_COMMUNITY)
Admission: RE | Disposition: A | Discharge status: Home / Self Care | Payer: Self-pay | Source: Home / Self Care | Attending: Neurological Surgery

## 2023-10-07 ENCOUNTER — Inpatient Hospital Stay (HOSPITAL_BASED_OUTPATIENT_CLINIC_OR_DEPARTMENT_OTHER): Payer: No Typology Code available for payment source | Admitting: Anesthesiology

## 2023-10-07 ENCOUNTER — Encounter (HOSPITAL_COMMUNITY): Payer: Self-pay | Admitting: Neurological Surgery

## 2023-10-07 ENCOUNTER — Inpatient Hospital Stay (HOSPITAL_COMMUNITY): Payer: No Typology Code available for payment source | Admitting: Anesthesiology

## 2023-10-07 ENCOUNTER — Other Ambulatory Visit: Payer: Self-pay

## 2023-10-07 ENCOUNTER — Inpatient Hospital Stay (HOSPITAL_COMMUNITY)
Admission: RE | Admit: 2023-10-07 | Discharge: 2023-10-09 | DRG: 029 | Disposition: A | Discharge status: Home / Self Care | Payer: No Typology Code available for payment source | Attending: Neurological Surgery | Admitting: Neurological Surgery

## 2023-10-07 DIAGNOSIS — I10 Essential (primary) hypertension: Secondary | ICD-10-CM | POA: Diagnosis present

## 2023-10-07 DIAGNOSIS — E039 Hypothyroidism, unspecified: Secondary | ICD-10-CM

## 2023-10-07 DIAGNOSIS — Z1152 Encounter for screening for COVID-19: Secondary | ICD-10-CM

## 2023-10-07 DIAGNOSIS — Z981 Arthrodesis status: Secondary | ICD-10-CM

## 2023-10-07 DIAGNOSIS — Z8619 Personal history of other infectious and parasitic diseases: Secondary | ICD-10-CM

## 2023-10-07 DIAGNOSIS — G96198 Other disorders of meninges, not elsewhere classified: Secondary | ICD-10-CM

## 2023-10-07 DIAGNOSIS — R059 Cough, unspecified: Secondary | ICD-10-CM

## 2023-10-07 DIAGNOSIS — Z79899 Other long term (current) drug therapy: Secondary | ICD-10-CM

## 2023-10-07 DIAGNOSIS — E6609 Other obesity due to excess calories: Secondary | ICD-10-CM | POA: Diagnosis present

## 2023-10-07 DIAGNOSIS — M1990 Unspecified osteoarthritis, unspecified site: Secondary | ICD-10-CM | POA: Diagnosis present

## 2023-10-07 DIAGNOSIS — K219 Gastro-esophageal reflux disease without esophagitis: Secondary | ICD-10-CM | POA: Diagnosis present

## 2023-10-07 DIAGNOSIS — Z90710 Acquired absence of both cervix and uterus: Secondary | ICD-10-CM

## 2023-10-07 DIAGNOSIS — Y838 Other surgical procedures as the cause of abnormal reaction of the patient, or of later complication, without mention of misadventure at the time of the procedure: Secondary | ICD-10-CM | POA: Diagnosis present

## 2023-10-07 DIAGNOSIS — Z6830 Body mass index (BMI) 30.0-30.9, adult: Secondary | ICD-10-CM

## 2023-10-07 DIAGNOSIS — M5416 Radiculopathy, lumbar region: Secondary | ICD-10-CM | POA: Diagnosis present

## 2023-10-07 DIAGNOSIS — Z87442 Personal history of urinary calculi: Secondary | ICD-10-CM

## 2023-10-07 DIAGNOSIS — G9600 Cerebrospinal fluid leak, unspecified: Secondary | ICD-10-CM | POA: Diagnosis present

## 2023-10-07 DIAGNOSIS — G9782 Other postprocedural complications and disorders of nervous system: Principal | ICD-10-CM | POA: Diagnosis present

## 2023-10-07 DIAGNOSIS — G8929 Other chronic pain: Secondary | ICD-10-CM | POA: Diagnosis present

## 2023-10-07 DIAGNOSIS — Z79890 Hormone replacement therapy (postmenopausal): Secondary | ICD-10-CM

## 2023-10-07 DIAGNOSIS — Z8249 Family history of ischemic heart disease and other diseases of the circulatory system: Secondary | ICD-10-CM

## 2023-10-07 DIAGNOSIS — E7800 Pure hypercholesterolemia, unspecified: Secondary | ICD-10-CM | POA: Diagnosis present

## 2023-10-07 HISTORY — PX: LUMBAR LAMINECTOMY/DECOMPRESSION MICRODISCECTOMY: SHX5026

## 2023-10-07 LAB — CBC
HCT: 30.5 % — ABNORMAL LOW (ref 36.0–46.0)
HCT: 33.7 % — ABNORMAL LOW (ref 36.0–46.0)
Hemoglobin: 9.2 g/dL — ABNORMAL LOW (ref 12.0–15.0)
Hemoglobin: 10.3 g/dL — ABNORMAL LOW (ref 12.0–15.0)
MCH: 24.3 pg — ABNORMAL LOW (ref 26.0–34.0)
MCH: 24.9 pg — ABNORMAL LOW (ref 26.0–34.0)
MCHC: 30.2 g/dL (ref 30.0–36.0)
MCHC: 30.6 g/dL (ref 30.0–36.0)
MCV: 80.7 fL (ref 80.0–100.0)
MCV: 81.6 fL (ref 80.0–100.0)
Platelets: 285 10*3/uL (ref 150–400)
Platelets: 289 10*3/uL (ref 150–400)
RBC: 3.78 MIL/uL — ABNORMAL LOW (ref 3.87–5.11)
RBC: 4.13 MIL/uL (ref 3.87–5.11)
RDW: 18.1 % — ABNORMAL HIGH (ref 11.5–15.5)
RDW: 18 % — ABNORMAL HIGH (ref 11.5–15.5)
WBC: 8.5 10*3/uL (ref 4.0–10.5)
WBC: 8.5 10*3/uL (ref 4.0–10.5)
nRBC: 0 % (ref 0.0–0.2)
nRBC: 0 % (ref 0.0–0.2)

## 2023-10-07 LAB — BASIC METABOLIC PANEL
Anion gap: 7 (ref 5–15)
BUN: 23 mg/dL (ref 8–23)
CO2: 31 mmol/L (ref 22–32)
Calcium: 8.4 mg/dL — ABNORMAL LOW (ref 8.9–10.3)
Chloride: 103 mmol/L (ref 98–111)
Creatinine, Ser: 0.96 mg/dL (ref 0.44–1.00)
GFR, Estimated: 59 mL/min — ABNORMAL LOW (ref >60)
Glucose, Bld: 78 mg/dL (ref 70–99)
Potassium: 3.9 mmol/L (ref 3.5–5.1)
Sodium: 141 mmol/L (ref 135–145)

## 2023-10-07 LAB — SURGICAL PCR SCREEN
MRSA, PCR: NEGATIVE
Staphylococcus aureus: NEGATIVE

## 2023-10-07 LAB — CREATININE, SERUM
Creatinine, Ser: 0.86 mg/dL (ref 0.44–1.00)
GFR, Estimated: >60 mL/min (ref >60)

## 2023-10-07 SURGERY — LUMBAR LAMINECTOMY/DECOMPRESSION MICRODISCECTOMY 1 LEVEL
Anesthesia: General | Site: Back

## 2023-10-07 MED ORDER — ROCURONIUM BROMIDE 10 MG/ML (PF) SYRINGE
PREFILLED_SYRINGE | INTRAVENOUS | Status: DC | PRN
  Administered 2023-10-07: 20 mg via INTRAVENOUS
  Administered 2023-10-07: 50 mg via INTRAVENOUS

## 2023-10-07 MED ORDER — PROBIOTIC-PREBIOTIC 1-250 BILLION-MG PO CAPS: ORAL_CAPSULE | Freq: Every day | ORAL | Status: DC

## 2023-10-07 MED ORDER — DIAZEPAM 5 MG PO TABS
5.0000 mg | ORAL_TABLET | Freq: Four times a day (QID) | ORAL | Status: DC | PRN
  Administered 2023-10-07 – 2023-10-08 (×3): 5 mg via ORAL
  Filled 2023-10-07 (×3): qty 1

## 2023-10-07 MED ORDER — DOCUSATE SODIUM 100 MG PO CAPS
100.0000 mg | ORAL_CAPSULE | Freq: Two times a day (BID) | ORAL | Status: DC
Start: 2023-10-07 — End: 2023-10-09
  Administered 2023-10-07 – 2023-10-08 (×2): 100 mg via ORAL
  Filled 2023-10-07 (×3): qty 1

## 2023-10-07 MED ORDER — CEFAZOLIN SODIUM 1 G IJ SOLR
INTRAMUSCULAR | Status: AC
  Filled 2023-10-07: qty 20

## 2023-10-07 MED ORDER — LIDOCAINE 2% (20 MG/ML) 5 ML SYRINGE
INTRAMUSCULAR | Status: DC | PRN
  Administered 2023-10-07: 60 mg via INTRAVENOUS

## 2023-10-07 MED ORDER — DEXAMETHASONE SODIUM PHOSPHATE 10 MG/ML IJ SOLN
INTRAMUSCULAR | Status: DC | PRN
  Administered 2023-10-07: 10 mg via INTRAVENOUS

## 2023-10-07 MED ORDER — PANTOPRAZOLE SODIUM 40 MG PO TBEC: 80.0000 mg | DELAYED_RELEASE_TABLET | Freq: Every day | ORAL | Status: DC

## 2023-10-07 MED ORDER — PHENOL 1.4 % MT LIQD: 1.0000 | OROMUCOSAL | Status: DC | PRN

## 2023-10-07 MED ORDER — DONEPEZIL HCL 10 MG PO TABS: 10.0000 mg | ORAL_TABLET | Freq: Every day | ORAL | Status: DC | PRN

## 2023-10-07 MED ORDER — DEXAMETHASONE SODIUM PHOSPHATE 10 MG/ML IJ SOLN
INTRAMUSCULAR | Status: AC
  Filled 2023-10-07: qty 1

## 2023-10-07 MED ORDER — EPHEDRINE SULFATE-NACL 50-0.9 MG/10ML-% IV SOSY
PREFILLED_SYRINGE | INTRAVENOUS | Status: DC | PRN
  Administered 2023-10-07: 10 mg via INTRAVENOUS

## 2023-10-07 MED ORDER — FENTANYL CITRATE (PF) 250 MCG/5ML IJ SOLN
INTRAMUSCULAR | Status: DC | PRN
  Administered 2023-10-07: 50 ug via INTRAVENOUS
  Administered 2023-10-07: 100 ug via INTRAVENOUS

## 2023-10-07 MED ORDER — CELECOXIB 200 MG PO CAPS
200.0000 mg | ORAL_CAPSULE | Freq: Once | ORAL | Status: AC
  Administered 2023-10-07: 200 mg via ORAL
  Filled 2023-10-07: qty 1

## 2023-10-07 MED ORDER — ACETAMINOPHEN 650 MG RE SUPP: 650.0000 mg | RECTAL | Status: DC | PRN

## 2023-10-07 MED ORDER — AMISULPRIDE (ANTIEMETIC) 5 MG/2ML IV SOLN: 10.0000 mg | Freq: Once | INTRAVENOUS | Status: DC | PRN

## 2023-10-07 MED ORDER — LACTATED RINGERS IV SOLN: INTRAVENOUS | Status: DC | PRN

## 2023-10-07 MED ORDER — HYDROMORPHONE HCL 1 MG/ML IJ SOLN
0.5000 mg | INTRAMUSCULAR | Status: DC | PRN
Start: 2023-10-07 — End: 2023-10-09

## 2023-10-07 MED ORDER — MENTHOL 3 MG MT LOZG: 1.0000 | LOZENGE | OROMUCOSAL | Status: DC | PRN

## 2023-10-07 MED ORDER — SENNA 8.6 MG PO TABS
1.0000 | ORAL_TABLET | Freq: Two times a day (BID) | ORAL | Status: DC
  Administered 2023-10-07 – 2023-10-08 (×3): 8.6 mg via ORAL
  Filled 2023-10-07 (×3): qty 1

## 2023-10-07 MED ORDER — THROMBIN 5000 UNITS EX SOLR
CUTANEOUS | Status: AC
  Filled 2023-10-07: qty 5000

## 2023-10-07 MED ORDER — PHENAZOPYRIDINE HCL 100 MG PO TABS: 95.0000 mg | ORAL_TABLET | Freq: Every day | ORAL | Status: DC

## 2023-10-07 MED ORDER — SUGAMMADEX SODIUM 200 MG/2ML IV SOLN
INTRAVENOUS | Status: DC | PRN
  Administered 2023-10-07: 200 mg via INTRAVENOUS

## 2023-10-07 MED ORDER — BUPIVACAINE HCL (PF) 0.5 % IJ SOLN
INTRAMUSCULAR | Status: DC | PRN
  Administered 2023-10-07: 30 mL

## 2023-10-07 MED ORDER — SODIUM CHLORIDE 0.9 % IV SOLN
250.0000 mL | INTRAVENOUS | Status: AC
  Administered 2023-10-07: 250 mL via INTRAVENOUS

## 2023-10-07 MED ORDER — FENTANYL CITRATE (PF) 100 MCG/2ML IJ SOLN
INTRAMUSCULAR | Status: AC
  Filled 2023-10-07: qty 2

## 2023-10-07 MED ORDER — SODIUM CHLORIDE 0.9% FLUSH
3.0000 mL | Freq: Two times a day (BID) | INTRAVENOUS | Status: DC
  Administered 2023-10-07 – 2023-10-08 (×4): 3 mL via INTRAVENOUS

## 2023-10-07 MED ORDER — ONDANSETRON HCL 4 MG/2ML IJ SOLN: 4.0000 mg | Freq: Four times a day (QID) | INTRAMUSCULAR | Status: DC | PRN

## 2023-10-07 MED ORDER — LEVOTHYROXINE SODIUM 137 MCG PO TABS
137.0000 ug | ORAL_TABLET | Freq: Every day | ORAL | Status: DC
Start: 2023-10-08 — End: 2023-10-09
  Administered 2023-10-08 – 2023-10-09 (×2): 137 ug via ORAL
  Filled 2023-10-07 (×2): qty 1

## 2023-10-07 MED ORDER — 0.9 % SODIUM CHLORIDE (POUR BTL) OPTIME
TOPICAL | Status: DC | PRN
  Administered 2023-10-07: 1000 mL

## 2023-10-07 MED ORDER — LIDOCAINE 2% (20 MG/ML) 5 ML SYRINGE
INTRAMUSCULAR | Status: AC
  Filled 2023-10-07: qty 5

## 2023-10-07 MED ORDER — THROMBIN 5000 UNITS EX SOLR
OROMUCOSAL | Status: DC | PRN
  Administered 2023-10-07: 5 mL via TOPICAL

## 2023-10-07 MED ORDER — SODIUM CHLORIDE 0.9% FLUSH: 3.0000 mL | INTRAVENOUS | Status: DC | PRN

## 2023-10-07 MED ORDER — FENTANYL CITRATE (PF) 100 MCG/2ML IJ SOLN
25.0000 ug | INTRAMUSCULAR | Status: DC | PRN
  Administered 2023-10-07 (×2): 25 ug via INTRAVENOUS

## 2023-10-07 MED ORDER — PROPOFOL 10 MG/ML IV BOLUS
INTRAVENOUS | Status: AC
  Filled 2023-10-07: qty 20

## 2023-10-07 MED ORDER — OXYCODONE-ACETAMINOPHEN 5-325 MG PO TABS
1.0000 | ORAL_TABLET | ORAL | Status: DC | PRN
  Administered 2023-10-07: 1 via ORAL
  Administered 2023-10-07 – 2023-10-08 (×2): 2 via ORAL
  Administered 2023-10-08: 1 via ORAL
  Administered 2023-10-08 – 2023-10-09 (×3): 2 via ORAL
  Filled 2023-10-07 (×7): qty 2

## 2023-10-07 MED ORDER — ONDANSETRON HCL 4 MG/2ML IJ SOLN
INTRAMUSCULAR | Status: AC
  Filled 2023-10-07: qty 2

## 2023-10-07 MED ORDER — BUPIVACAINE HCL (PF) 0.5 % IJ SOLN
INTRAMUSCULAR | Status: AC
  Filled 2023-10-07: qty 30

## 2023-10-07 MED ORDER — CEFAZOLIN SODIUM-DEXTROSE 2-4 GM/100ML-% IV SOLN
2.0000 g | Freq: Three times a day (TID) | INTRAVENOUS | Status: AC
Start: 2023-10-07 — End: 2023-10-08
  Administered 2023-10-07 (×2): 2 g via INTRAVENOUS
  Filled 2023-10-07 (×2): qty 100

## 2023-10-07 MED ORDER — PANTOPRAZOLE SODIUM 40 MG PO TBEC
80.0000 mg | DELAYED_RELEASE_TABLET | Freq: Every day | ORAL | Status: DC
  Administered 2023-10-07 – 2023-10-09 (×3): 80 mg via ORAL
  Filled 2023-10-07 (×3): qty 2

## 2023-10-07 MED ORDER — ONDANSETRON HCL 4 MG/2ML IJ SOLN
INTRAMUSCULAR | Status: DC | PRN
  Administered 2023-10-07: 4 mg via INTRAVENOUS

## 2023-10-07 MED ORDER — CHLORHEXIDINE GLUCONATE 0.12 % MT SOLN
15.0000 mL | Freq: Once | OROMUCOSAL | Status: AC
  Administered 2023-10-07: 15 mL via OROMUCOSAL
  Filled 2023-10-07: qty 15

## 2023-10-07 MED ORDER — LIDOCAINE-EPINEPHRINE 1 %-1:100000 IJ SOLN
INTRAMUSCULAR | Status: AC
  Filled 2023-10-07: qty 1

## 2023-10-07 MED ORDER — HEMOSTATIC AGENTS (NO CHARGE) OPTIME
TOPICAL | Status: DC | PRN
  Administered 2023-10-07: 1 via TOPICAL

## 2023-10-07 MED ORDER — ONDANSETRON HCL 4 MG PO TABS: 4.0000 mg | ORAL_TABLET | Freq: Four times a day (QID) | ORAL | Status: DC | PRN

## 2023-10-07 MED ORDER — ORAL CARE MOUTH RINSE: 15.0000 mL | Freq: Once | OROMUCOSAL | Status: AC

## 2023-10-07 MED ORDER — TEMAZEPAM 15 MG PO CAPS
15.0000 mg | ORAL_CAPSULE | Freq: Every evening | ORAL | Status: DC | PRN
  Administered 2023-10-07 – 2023-10-08 (×2): 15 mg via ORAL
  Filled 2023-10-07 (×2): qty 1

## 2023-10-07 MED ORDER — POLYETHYLENE GLYCOL 3350 17 G PO PACK: 17.0000 g | PACK | Freq: Every day | ORAL | Status: DC | PRN

## 2023-10-07 MED ORDER — ROCURONIUM BROMIDE 10 MG/ML (PF) SYRINGE
PREFILLED_SYRINGE | INTRAVENOUS | Status: AC
  Filled 2023-10-07: qty 10

## 2023-10-07 MED ORDER — ACETAMINOPHEN 500 MG PO TABS
1000.0000 mg | ORAL_TABLET | Freq: Once | ORAL | Status: AC
  Administered 2023-10-07: 1000 mg via ORAL
  Filled 2023-10-07: qty 2

## 2023-10-07 MED ORDER — EPHEDRINE 5 MG/ML INJ
INTRAVENOUS | Status: AC
  Filled 2023-10-07: qty 5

## 2023-10-07 MED ORDER — ENOXAPARIN SODIUM 40 MG/0.4ML IJ SOSY
40.0000 mg | PREFILLED_SYRINGE | INTRAMUSCULAR | Status: DC
  Administered 2023-10-08 – 2023-10-09 (×2): 40 mg via SUBCUTANEOUS
  Filled 2023-10-07 (×2): qty 0.4

## 2023-10-07 MED ORDER — PROPOFOL 10 MG/ML IV BOLUS
INTRAVENOUS | Status: DC | PRN
  Administered 2023-10-07: 30 mg via INTRAVENOUS
  Administered 2023-10-07: 120 mg via INTRAVENOUS

## 2023-10-07 MED ORDER — ACETAMINOPHEN 325 MG PO TABS: 650.0000 mg | ORAL_TABLET | ORAL | Status: DC | PRN

## 2023-10-07 MED ORDER — ATORVASTATIN CALCIUM 10 MG PO TABS
10.0000 mg | ORAL_TABLET | ORAL | Status: DC
  Administered 2023-10-08: 10 mg via ORAL
  Filled 2023-10-07: qty 1

## 2023-10-07 MED ORDER — CEFAZOLIN SODIUM-DEXTROSE 2-3 GM-%(50ML) IV SOLR
INTRAVENOUS | Status: DC | PRN
  Administered 2023-10-07: 2 g via INTRAVENOUS

## 2023-10-07 MED ORDER — SODIUM CHLORIDE 0.9% FLUSH: 10.0000 mL | Freq: Two times a day (BID) | INTRAVENOUS | Status: DC

## 2023-10-07 MED ORDER — FENTANYL CITRATE (PF) 250 MCG/5ML IJ SOLN
INTRAMUSCULAR | Status: AC
  Filled 2023-10-07: qty 5

## 2023-10-07 MED ORDER — FLEET ENEMA RE ENEM: 1.0000 | ENEMA | Freq: Once | RECTAL | Status: DC | PRN

## 2023-10-07 MED ORDER — BISACODYL 10 MG RE SUPP: 10.0000 mg | Freq: Every day | RECTAL | Status: DC | PRN

## 2023-10-07 SURGICAL SUPPLY — 54 items
ADH SKN CLS APL DERMABOND .7 (GAUZE/BANDAGES/DRESSINGS) ×1
BAG COUNTER SPONGE SURGICOUNT (BAG) ×1 IMPLANT
BAG SPNG CNTER NS LX DISP (BAG) ×1
BASKET BONE COLLECTION (BASKET) IMPLANT
BLADE CLIPPER SURG (BLADE) IMPLANT
BUR ACORN 6.0 (BURR) IMPLANT
BUR MATCHSTICK NEURO 3.0 LAGG (BURR) ×1 IMPLANT
CANISTER SUCT 3000ML PPV (MISCELLANEOUS) ×1 IMPLANT
DERMABOND ADVANCED .7 DNX12 (GAUZE/BANDAGES/DRESSINGS) ×1 IMPLANT
DEVICE DISSECT PLASMABLAD 3.0S (MISCELLANEOUS) ×1 IMPLANT
DEVICE DURAL REPAIR (MISCELLANEOUS) IMPLANT
DRAPE HALF SHEET 40X57 (DRAPES) IMPLANT
DRAPE LAPAROTOMY T 102X78X121 (DRAPES) ×1 IMPLANT
DRAPE MICROSCOPE SLANT 54X150 (MISCELLANEOUS) IMPLANT
DRSG OPSITE POSTOP 4X6 (GAUZE/BANDAGES/DRESSINGS) IMPLANT
DURAPREP 26ML APPLICATOR (WOUND CARE) ×1 IMPLANT
ELECT REM PT RETURN 9FT ADLT (ELECTROSURGICAL) ×1
ELECTRODE REM PT RTRN 9FT ADLT (ELECTROSURGICAL) ×1 IMPLANT
GAUZE 4X4 16PLY ~~LOC~~+RFID DBL (SPONGE) IMPLANT
GAUZE SPONGE 4X4 12PLY STRL (GAUZE/BANDAGES/DRESSINGS) ×1 IMPLANT
GLOVE BIOGEL PI IND STRL 7.0 (GLOVE) IMPLANT
GLOVE BIOGEL PI IND STRL 7.5 (GLOVE) IMPLANT
GLOVE BIOGEL PI IND STRL 8.5 (GLOVE) ×1 IMPLANT
GLOVE ECLIPSE 8.5 STRL (GLOVE) ×1 IMPLANT
GOWN STRL REUS W/ TWL LRG LVL3 (GOWN DISPOSABLE) IMPLANT
GOWN STRL REUS W/ TWL XL LVL3 (GOWN DISPOSABLE) IMPLANT
GOWN STRL REUS W/TWL 2XL LVL3 (GOWN DISPOSABLE) ×1 IMPLANT
GOWN STRL REUS W/TWL LRG LVL3 (GOWN DISPOSABLE) ×1
GOWN STRL REUS W/TWL XL LVL3 (GOWN DISPOSABLE) ×3
GRAFT DURAGEN MATRIX 1WX1L (Tissue) IMPLANT
HEMOSTAT POWDER KIT SURGIFOAM (HEMOSTASIS) ×1 IMPLANT
KIT BASIN OR (CUSTOM PROCEDURE TRAY) ×1 IMPLANT
KIT TURNOVER KIT B (KITS) ×1 IMPLANT
NDL HYPO 22X1.5 SAFETY MO (MISCELLANEOUS) ×1 IMPLANT
NDL SPNL 20GX3.5 QUINCKE YW (NEEDLE) IMPLANT
NEEDLE HYPO 22X1.5 SAFETY MO (MISCELLANEOUS) ×1 IMPLANT
NEEDLE SPNL 20GX3.5 QUINCKE YW (NEEDLE) IMPLANT
NS IRRIG 1000ML POUR BTL (IV SOLUTION) ×1 IMPLANT
PACK LAMINECTOMY NEURO (CUSTOM PROCEDURE TRAY) ×1 IMPLANT
PAD ARMBOARD 7.5X6 YLW CONV (MISCELLANEOUS) ×3 IMPLANT
PATTIES SURGICAL .5 X1 (DISPOSABLE) ×1 IMPLANT
PLASMABLADE 3.0S (MISCELLANEOUS) ×1
SEALANT ADHERUS EXTEND TIP (MISCELLANEOUS) IMPLANT
SPIKE FLUID TRANSFER (MISCELLANEOUS) ×1 IMPLANT
SPONGE SURGIFOAM ABS GEL SZ50 (HEMOSTASIS) IMPLANT
SUT VIC AB 1 CT1 18XBRD ANBCTR (SUTURE) ×1 IMPLANT
SUT VIC AB 1 CT1 8-18 (SUTURE) ×1
SUT VIC AB 2-0 CP2 18 (SUTURE) ×1 IMPLANT
SUT VIC AB 3-0 SH 8-18 (SUTURE) ×1 IMPLANT
SUT VIC AB 4-0 RB1 18 (SUTURE) ×1 IMPLANT
TOWEL GREEN STERILE (TOWEL DISPOSABLE) ×1 IMPLANT
TOWEL GREEN STERILE FF (TOWEL DISPOSABLE) ×1 IMPLANT
TUBING FEATHERFLOW (TUBING) ×1 IMPLANT
WATER STERILE IRR 1000ML POUR (IV SOLUTION) ×1 IMPLANT

## 2023-10-07 NOTE — Anesthesia Procedure Notes (Signed)
Procedure Name: Intubation Date/Time: 10/07/2023 8:24 AM  Performed by: Georgianne Fick D, CRNAPre-anesthesia Checklist: Patient identified, Emergency Drugs available, Suction available and Patient being monitored Patient Re-evaluated:Patient Re-evaluated prior to induction Oxygen Delivery Method: Circle System Utilized Preoxygenation: Pre-oxygenation with 100% oxygen Induction Type: IV induction Ventilation: Mask ventilation without difficulty Laryngoscope Size: Mac and 3 Grade View: Grade II Tube type: Oral Number of attempts: 1 Airway Equipment and Method: Stylet and Oral airway Placement Confirmation: ETT inserted through vocal cords under direct vision, positive ETCO2 and breath sounds checked- equal and bilateral Secured at: 20 cm Tube secured with: Tape Dental Injury: Teeth and Oropharynx as per pre-operative assessment

## 2023-10-07 NOTE — Plan of Care (Signed)

## 2023-10-07 NOTE — Transfer of Care (Signed)
Immediate Anesthesia Transfer of Care Note  Patient: Theresa Holmes  Procedure(s) Performed: REVISION LUMBAR ONE-TWO LAMINECTOMY, REAIR CEREBRO SPINAL FLUID LEAK (Back)  Patient Location: PACU  Anesthesia Type:General  Level of Consciousness: awake, alert , and oriented  Airway & Oxygen Therapy: Patient Spontanous Breathing  Post-op Assessment: Report given to RN and Post -op Vital signs reviewed and stable  Post vital signs: Reviewed and stable  Last Vitals:  Vitals Value Taken Time  BP    Temp    Pulse    Resp    SpO2      Last Pain:  Vitals:   10/07/23 0650  TempSrc:   PainSc: 8          Complications: No notable events documented.

## 2023-10-07 NOTE — Anesthesia Postprocedure Evaluation (Signed)
Anesthesia Post Note  Patient: Benjiman Core  Procedure(s) Performed: REVISION LUMBAR ONE-TWO LAMINECTOMY, REAIR CEREBRO SPINAL FLUID LEAK (Back)     Patient location during evaluation: PACU Anesthesia Type: General Level of consciousness: awake and alert Pain management: pain level controlled Vital Signs Assessment: post-procedure vital signs reviewed and stable Respiratory status: spontaneous breathing, nonlabored ventilation, respiratory function stable and patient connected to nasal cannula oxygen Cardiovascular status: blood pressure returned to baseline and stable Postop Assessment: no apparent nausea or vomiting Anesthetic complications: no   No notable events documented.  Last Vitals:  Vitals:   10/07/23 1230 10/07/23 1251  BP: (!) 152/79 (!) 163/75  Pulse: (!) 53 (!) 53  Resp: 12 20  Temp:  36.6 C  SpO2: 95% 100%    Last Pain:  Vitals:   10/07/23 1145  TempSrc:   PainSc: 5                  Jonavin Seder P Lekeya Rollings

## 2023-10-07 NOTE — Op Note (Signed)
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 Postoperative diagnosis: Same Procedure: Reexploration of laminotomy L1-L2 and closure of CSF leak, decompression of pseudomeningocele. Surgeon: Barnett Abu First Assistant: Hoyt Koch, MD Anesthesia: General endotracheal Indications: Theresa Holmes is an 82 year old individual who underwent the laminectomy about 3 weeks ago.  She has had persistent pain in the left lower extremity with certain positions and has had a substantial subcutaneous few fluid mass that has become evident over the last 2 weeks time.  An MRI demonstrates presence of a pseudomeningocele from the L1-L2 region.  Procedure: The patient was brought to the operating room supine on the stretcher.  After the smooth induction of general endotracheal anesthesia a Foley catheter was placed and she was carefully turned prone.  The back was prepped with alcohol DuraPrep and draped in a sterile fashion.  Previously made incision was reopened and immediately in the subcutaneous tissues a substantial fluid collection was encountered.  This was drained and an opening into the the subperiosteal space on the left side at L1-L2 was encountered.  This was further opened to allow placement of a retractor.  Laminotomy site could be identified.  It appeared that the dura was pushed over to the opposite side.  Because of the nature of the leak on the ventral aspect of the dura and the laminotomy required enlargement.  The incision was extended to allow further exploration of the wound and self-retaining retractor was placed in the wound on that left side.  High-speed drill was then used under the operating microscope to enlarged to laminotomy removing the superior portion of the facet joint and extending the laminotomy cephalad.  The laminotomy was also extended laterally thinning the facet joint.  When an adequate exposure was noted we then carefully rolled the  dura medially and I could identify an opening in the dura on the lateral more ventral aspect of it.  No nerve roots were herniated.  The area was irrigated copiously and I irrigated into the dural sac to expand somewhat.  It is evident that this clearly allowed for egress of the spinal fluid.  Then using a dura stat suture placement device we placed a singular 5-0 Vicryl type suture with the dura stat from the inside out.  This for suture allowed for closure of the biggest portion of the leak however each individual end was then used to run cephalad and inferiorly placing 2 additional sutures superiorly and 1 inferiorly.  In the end these were tied with a running construct for the closure.  After careful inspection we placed the area under Valsalva to 40 cm of water.  Aside from some leakage from the stitch holes no gross CSF leakage was noted.  With this it was decided to cover this area with DuraGen and a small piece of DuraGen was fashioned to fit over the repair site.  Beyond this we placed a layer of adheres to secure the closure.  In the end no CSF leakage was noted.  We then remove the retractor carefully checked for hemostasis and closed the lumbodorsal fascia with 2-0 Vicryl in a very tight fashion.  2-0 Vicryl was used in the subcutaneous tissues and 3-0 Vicryl was used subcuticularly along with 4-0 Vicryl for the final subcuticular closure.  Dermabond was placed on the skin and 1 dried a honeycomb dressing was placed over this.  Patient tolerated procedure well blood loss was estimated at 75 cc.  Dr. Hoyt Koch helped during the exposure and  closure of the CSF leak by maintaining traction on sutures and providing suctioning well I worked to place the sutures carefully through the dural aperture.

## 2023-10-07 NOTE — H&P (Signed)
Theresa Holmes is an 82 y.o. female.   Chief Complaint: Pseudomeningocele L1-L2 left status post discectomy. HPI: Patient is an 82 year old individual who has had significant back and bilateral lower extremity pain she had large needed nucleus pulposus at L1-L2 and 3 weeks ago underwent surgical intervention to decompress this area as she developed a pseudomeningocele postoperatively and has had extreme pain in the back and the leg with certain positions she has not taken to the operating room to undergo surgical decompression.  She may require stabilization procedure.  Past Medical History:  Diagnosis Date   Arthritis    Atypical chest pain 10/22/2016   Chronic back pain    Exogenous obesity    GERD (gastroesophageal reflux disease)    H/O hiatal hernia    History of kidney stones    Hypercholesterolemia    Hyperlipidemia    Hypertension    12/14/2018 pt. denies   Hypothyroidism    HX PARTIAL THYROIDECTOMY FOR  NODULE- BENIGN   Shingles 11/2012   Spinal stenosis    Thyroid mass    right, benign   Varicose vein     Past Surgical History:  Procedure Laterality Date   ABDOMINAL HYSTERECTOMY  1985   ANTERIOR CERVICAL DECOMP/DISCECTOMY FUSION N/A 06/17/2022   Procedure: ANTERIOR CERVICAL DISCECTOMY AND FUSION CERVICAL FIVE TO SEVEN;  Surgeon: Venita Lick, MD;  Location: [facility] OR;  Service: Orthopedics;  Laterality: N/A;  3.5 hrs 3 C-bed   ANTERIOR LAT LUMBAR FUSION N/A 12/16/2018   Procedure: ANTERIOR LATERAL LUMBAR FUSION L 3-4 with lateral plate;  Surgeon: Venita Lick, MD;  Location: [facility] OR;  Service: Orthopedics;  Laterality: N/A;  3 1/2 hours   APPENDECTOMY     BREAST BIOPSY  9/05   stereotactic   CARPAL TUNNEL RELEASE  12/15   right wrist   CHOLECYSTECTOMY     COMBINED AUGMENTATION MAMMAPLASTY AND ABDOMINOPLASTY  1977   EYE SURGERY  12/05   retinal tear-left   HAMMER TOE SURGERY  2002   HERNIA REPAIR  2004   HIATAL HERNIA REPAIR N/A 01/21/2014   Procedure: LAPAROSCOPIC  REPAIR RECURRENT  HIATAL HERNIA REDO NISSEN, upper endoscopy;  Surgeon: Valarie Merino, MD;  Location: [facility];  Service: General;  Laterality: N/A;   KIDNEY STONE SURGERY  2002   NISSEN FUNDOPLICATION     with cholecystectomy   ROTATOR CUFF REPAIR  2001   ROTATOR CUFF REPAIR  1/12    left   ROTATOR CUFF REPAIR  5/13    open   SPINAL FUSION  2005   L4  & L5   THYROIDECTOMY, PARTIAL     TONSILLECTOMY     TONSILLECTOMY AND ADENOIDECTOMY     as a child   UPPER ENDOSCOPY W/ SCLEROTHERAPY  2000   VARICOSE VEIN SURGERY  1977    Family History  Problem Relation Age of Onset   Pulmonary embolism Mother        amniotic fluid embolism   Kidney failure Father        complete shut down   Heart failure Father    Breast cancer Maternal Grandmother 44   Alzheimer's disease Cousin    Social History:  reports that she has never smoked. She has never used smokeless tobacco. She reports current alcohol use of about 1.0 standard drink of alcohol per week. She reports that she does not use drugs.  Allergies: No Known Allergies  Medications Prior to Admission  Medication Sig Dispense Refill   atorvastatin (LIPITOR)  10 MG tablet Take 10 mg by mouth 2 (two) times a week. Takes on Wednesday and Sunday     diazepam (VALIUM) 5 MG tablet Take 5 mg by mouth every 6 (six) hours as needed for muscle spasms.     donepezil (ARICEPT) 10 MG tablet Take 10 mg by mouth as needed (dementia).     levothyroxine (SYNTHROID, LEVOTHROID) 137 MCG tablet Take 137 mcg by mouth daily before breakfast.     omeprazole (PRILOSEC) 40 MG capsule Take 40 mg by mouth daily.     oxyCODONE-acetaminophen (PERCOCET/ROXICET) 5-325 MG tablet Take 1-2 tablets by mouth every 4 (four) hours as needed for severe pain (pain score 7-10).     temazepam (RESTORIL) 15 MG capsule Take 15 mg by mouth at bedtime as needed for sleep.     Bacillus Coagulans-Inulin (PROBIOTIC-PREBIOTIC PO) Take 1 tablet by mouth daily. (Patient not taking:  Reported on 10/06/2023)     Biotin 10 MG CAPS Take 10 mg by mouth daily. (Patient not taking: Reported on 10/06/2023)     Cholecalciferol (VITAMIN D) 50 MCG (2000 UT) CAPS Take 2,000 Units by mouth daily. (Patient not taking: Reported on 10/06/2023)     Cyanocobalamin (B-12 PO) Take 1 tablet by mouth daily. (Patient not taking: Reported on 10/06/2023)     esomeprazole (NEXIUM) 40 MG capsule Take 40 mg by mouth daily. (Patient not taking: Reported on 10/06/2023)     Phenazopyridine HCl (AZO TABS PO) Take 1 tablet by mouth daily. (Patient not taking: Reported on 10/06/2023)      Results for orders placed or performed during the hospital encounter of 10/07/23 (from the past 48 hour(s))  Basic metabolic panel per protocol     Status: Abnormal   Collection Time: 10/07/23  6:55 AM  Result Value Ref Range   Sodium 141 135 - 145 mmol/L   Potassium 3.9 3.5 - 5.1 mmol/L   Chloride 103 98 - 111 mmol/L   CO2 31 22 - 32 mmol/L   Glucose, Bld 78 70 - 99 mg/dL    Comment: Glucose reference range applies only to samples taken after fasting for at least 8 hours.   BUN 23 8 - 23 mg/dL   Creatinine, Ser 4.09 0.44 - 1.00 mg/dL   Calcium 8.4 (L) 8.9 - 10.3 mg/dL   GFR, Estimated 59 (L) >60 mL/min    Comment: (NOTE) Calculated using the CKD-EPI Creatinine Equation (2021)    Anion gap 7 5 - 15    Comment: Performed at [facility], [address], [place], [place] [address]  CBC per protocol     Status: Abnormal   Collection Time: 10/07/23  6:55 AM  Result Value Ref Range   WBC 8.5 4.0 - 10.5 K/uL   RBC 3.78 (L) 3.87 - 5.11 MIL/uL   Hemoglobin 9.2 (L) 12.0 - 15.0 g/dL   HCT 47.8 (L) 29.5 - 62.1 %   MCV 80.7 80.0 - 100.0 fL   MCH 24.3 (L) 26.0 - 34.0 pg   MCHC 30.2 30.0 - 36.0 g/dL   RDW 30.8 (H) 65.7 - 84.6 %   Platelets 285 150 - 400 K/uL   nRBC 0.0 0.0 - 0.2 %    Comment: Performed at [facility], [address] [place], [place] [address]   No results found.  Review of Systems   Constitutional:  Positive for activity change.  Musculoskeletal:  Positive for back pain and gait problem.  Neurological:  Positive for weakness  and numbness.  All other systems reviewed and are negative.   Blood pressure (!) 140/71, pulse (!) 54, temperature 98.5 F (36.9 C), temperature source Oral, resp. rate 16, height 5\' 2"  (1.575 m), weight 74.8 kg, last menstrual period 11/26/1983, SpO2 97%. Physical Exam Constitutional:      Appearance: Normal appearance.  HENT:     Head: Normocephalic and atraumatic.     Right Ear: Tympanic membrane, ear canal and external ear normal.     Left Ear: Tympanic membrane, ear canal and external ear normal.     Nose: Nose normal.     Mouth/Throat:     Mouth: Mucous membranes are moist.     Pharynx: Oropharynx is clear.  Eyes:     Extraocular Movements: Extraocular movements intact.     Conjunctiva/sclera: Conjunctivae normal.     Pupils: Pupils are equal, round, and reactive to light.  Cardiovascular:     Rate and Rhythm: Normal rate and regular rhythm.     Pulses: Normal pulses.     Heart sounds: Normal heart sounds.  Pulmonary:     Effort: Pulmonary effort is normal.     Breath sounds: Normal breath sounds.  Abdominal:     General: Abdomen is flat. Bowel sounds are normal.     Palpations: Abdomen is soft.  Musculoskeletal:        General: Normal range of motion.     Cervical back: Normal range of motion and neck supple.  Skin:    General: Skin is warm and dry.     Capillary Refill: Capillary refill takes less than 2 seconds.  Neurological:     Mental Status: She is alert.     Comments: Mild weakness in the iliopsoas on the right and on the left at 4 out of 5 decreased deep tendon reflexes in the patellae and the Achilles tibialis anterior and gastroc strength is 4+ out of 5 bilaterally.  Gait is difficult to characterize secondary to patient's extreme pain nerve and upper extremity strength appear intact  Psychiatric:        Mood and  Affect: Mood normal.        Behavior: Behavior normal.        Thought Content: Thought content normal.        Judgment: Judgment normal.      Assessment/Plan Status post L1-L2 laminectomy and discectomy with resultant pseudomeningocele.  Plan repair of pseudomeningocele possible arthrodesis L1-L2.  Stefani Dama, MD 10/07/2023, 7:41 AM

## 2023-10-08 ENCOUNTER — Encounter (HOSPITAL_COMMUNITY): Payer: Self-pay | Admitting: Neurological Surgery

## 2023-10-08 ENCOUNTER — Observation Stay (HOSPITAL_COMMUNITY): Payer: No Typology Code available for payment source

## 2023-10-08 DIAGNOSIS — G9782 Other postprocedural complications and disorders of nervous system: Secondary | ICD-10-CM | POA: Diagnosis present

## 2023-10-08 DIAGNOSIS — Z90710 Acquired absence of both cervix and uterus: Secondary | ICD-10-CM | POA: Diagnosis not present

## 2023-10-08 DIAGNOSIS — G9600 Cerebrospinal fluid leak, unspecified: Secondary | ICD-10-CM | POA: Diagnosis present

## 2023-10-08 DIAGNOSIS — M1990 Unspecified osteoarthritis, unspecified site: Secondary | ICD-10-CM | POA: Diagnosis present

## 2023-10-08 DIAGNOSIS — Z6830 Body mass index (BMI) 30.0-30.9, adult: Secondary | ICD-10-CM | POA: Diagnosis not present

## 2023-10-08 DIAGNOSIS — Y838 Other surgical procedures as the cause of abnormal reaction of the patient, or of later complication, without mention of misadventure at the time of the procedure: Secondary | ICD-10-CM | POA: Diagnosis present

## 2023-10-08 DIAGNOSIS — G96198 Other disorders of meninges, not elsewhere classified: Secondary | ICD-10-CM

## 2023-10-08 DIAGNOSIS — Z87442 Personal history of urinary calculi: Secondary | ICD-10-CM | POA: Diagnosis not present

## 2023-10-08 DIAGNOSIS — Z1152 Encounter for screening for COVID-19: Secondary | ICD-10-CM | POA: Diagnosis not present

## 2023-10-08 DIAGNOSIS — Z79890 Hormone replacement therapy (postmenopausal): Secondary | ICD-10-CM | POA: Diagnosis not present

## 2023-10-08 DIAGNOSIS — Z8619 Personal history of other infectious and parasitic diseases: Secondary | ICD-10-CM | POA: Diagnosis not present

## 2023-10-08 DIAGNOSIS — M5416 Radiculopathy, lumbar region: Secondary | ICD-10-CM | POA: Diagnosis present

## 2023-10-08 DIAGNOSIS — Z79899 Other long term (current) drug therapy: Secondary | ICD-10-CM | POA: Diagnosis not present

## 2023-10-08 DIAGNOSIS — E7800 Pure hypercholesterolemia, unspecified: Secondary | ICD-10-CM | POA: Diagnosis present

## 2023-10-08 DIAGNOSIS — K219 Gastro-esophageal reflux disease without esophagitis: Secondary | ICD-10-CM | POA: Diagnosis present

## 2023-10-08 DIAGNOSIS — R051 Acute cough: Secondary | ICD-10-CM | POA: Diagnosis not present

## 2023-10-08 DIAGNOSIS — G8929 Other chronic pain: Secondary | ICD-10-CM | POA: Diagnosis present

## 2023-10-08 DIAGNOSIS — I10 Essential (primary) hypertension: Secondary | ICD-10-CM | POA: Diagnosis present

## 2023-10-08 DIAGNOSIS — E039 Hypothyroidism, unspecified: Secondary | ICD-10-CM | POA: Diagnosis present

## 2023-10-08 DIAGNOSIS — R059 Cough, unspecified: Secondary | ICD-10-CM

## 2023-10-08 DIAGNOSIS — Z8249 Family history of ischemic heart disease and other diseases of the circulatory system: Secondary | ICD-10-CM | POA: Diagnosis not present

## 2023-10-08 DIAGNOSIS — E6609 Other obesity due to excess calories: Secondary | ICD-10-CM | POA: Diagnosis present

## 2023-10-08 DIAGNOSIS — Z981 Arthrodesis status: Secondary | ICD-10-CM | POA: Diagnosis not present

## 2023-10-08 LAB — RESPIRATORY PANEL BY PCR

## 2023-10-08 LAB — RESP PANEL BY RT-PCR (RSV, FLU A&B, COVID)  RVPGX2
Influenza A by PCR: NEGATIVE
Influenza B by PCR: NEGATIVE
Resp Syncytial Virus by PCR: NEGATIVE
SARS Coronavirus 2 by RT PCR: NEGATIVE

## 2023-10-08 LAB — PROCALCITONIN: Procalcitonin: <0.1 ng/mL

## 2023-10-08 MED ORDER — GUAIFENESIN ER 600 MG PO TB12: 600.0000 mg | ORAL_TABLET | Freq: Two times a day (BID) | ORAL | Status: DC

## 2023-10-08 MED ORDER — GUAIFENESIN ER 600 MG PO TB12
1200.0000 mg | ORAL_TABLET | Freq: Two times a day (BID) | ORAL | Status: DC
  Administered 2023-10-08 (×2): 1200 mg via ORAL
  Filled 2023-10-08 (×3): qty 2

## 2023-10-08 MED ORDER — DONEPEZIL HCL 10 MG PO TABS
10.0000 mg | ORAL_TABLET | Freq: Every day | ORAL | Status: DC
  Administered 2023-10-08 – 2023-10-09 (×2): 10 mg via ORAL
  Filled 2023-10-08 (×2): qty 1

## 2023-10-08 MED ORDER — KETOROLAC TROMETHAMINE 15 MG/ML IJ SOLN
15.0000 mg | Freq: Once | INTRAMUSCULAR | Status: AC
  Administered 2023-10-08: 15 mg via INTRAVENOUS
  Filled 2023-10-08: qty 1

## 2023-10-08 MED ORDER — BENZONATATE 100 MG PO CAPS: 200.0000 mg | ORAL_CAPSULE | Freq: Two times a day (BID) | ORAL | Status: DC | PRN

## 2023-10-08 MED ORDER — DEXAMETHASONE 4 MG PO TABS
2.0000 mg | ORAL_TABLET | Freq: Two times a day (BID) | ORAL | Status: DC
  Administered 2023-10-08 – 2023-10-09 (×3): 2 mg via ORAL
  Filled 2023-10-08 (×3): qty 1

## 2023-10-08 MED ORDER — DEXAMETHASONE SODIUM PHOSPHATE 4 MG/ML IJ SOLN
4.0000 mg | Freq: Once | INTRAMUSCULAR | Status: AC
  Administered 2023-10-08: 4 mg via INTRAVENOUS
  Filled 2023-10-08: qty 1

## 2023-10-08 NOTE — Evaluation (Signed)
Physical Therapy Evaluation Patient Details Name: Theresa Holmes MRN: [id number] DOB: 1941/10/14 Today's Date: 10/08/2023  History of Present Illness  Theresa Holmes is an 82 year old female who underwent REVISION LUMBAR ONE-TWO LAMINECTOMY, REAIR CEREBRO SPINAL FLUID LEAK 11/12. PMHx: arthritis, GERD, kidney stones, hypercholesterolemia, HLD, HTN, hypothyroidism  Clinical Impression  Pt admitted with above diagnosis and presents to PT with functional limitations due to deficits listed below (See PT problem list). Pt needs skilled PT to maximize independence and safety. Pt moving well post op and expect she will progress quickly toward baseline. She is eager to return home. Recommend return home with family          If plan is discharge home, recommend the following: Help with stairs or ramp for entrance;Assist for transportation;Assistance with cooking/housework   Can travel by private vehicle        Equipment Recommendations None recommended by PT  Recommendations for Other Services       Functional Status Assessment Patient has had a recent decline in their functional status and demonstrates the ability to make significant improvements in function in a reasonable and predictable amount of time.     Precautions / Restrictions Precautions Precautions: Back;Fall Precaution Booklet Issued: Yes (comment) Restrictions Weight Bearing Restrictions: No      Mobility  Bed Mobility Overal bed mobility: Needs Assistance             General bed mobility comments: Pt up in chair    Transfers Overall transfer level: Needs assistance Equipment used: Rolling walker (2 wheels) Transfers: Sit to/from Stand Sit to Stand: Contact guard assist           General transfer comment: Assist for safety. Good placement of hands    Ambulation/Gait Ambulation/Gait assistance: Contact guard assist Gait Distance (Feet): 175 Feet Assistive device: Rolling walker (2 wheels) Gait  Pattern/deviations: Step-through pattern, Decreased stride length, Trunk flexed Gait velocity: decr Gait velocity interpretation: 1.31 - 2.62 ft/sec, indicative of limited community ambulator   General Gait Details: Verbal cues to stay closer to the walker  Stairs            Wheelchair Mobility     Tilt Bed    Modified Rankin (Stroke Patients Only)       Balance Overall balance assessment: Needs assistance Sitting-balance support: Feet supported Sitting balance-Leahy Scale: Fair     Standing balance support: No upper extremity supported, During functional activity Standing balance-Leahy Scale: Poor Standing balance comment: UE support                             Pertinent Vitals/Pain Pain Assessment Pain Assessment: Faces Faces Pain Scale: Hurts little more Pain Location: back Pain Descriptors / Indicators: Discomfort, Grimacing Pain Intervention(s): Limited activity within patient's tolerance, Monitored during session    Home Living Family/patient expects to be discharged to:: Private residence Living Arrangements: Spouse/significant other Available Help at Discharge: Family;Available 24 hours/day Type of Home: House Home Access: Stairs to enter Entrance Stairs-Rails: Right Entrance Stairs-Number of Steps: 3   Home Layout: Two level;Able to live on main level with bedroom/bathroom Home Equipment: Rolling Walker (2 wheels);Shower seat;Grab bars - toilet;Grab bars - tub/shower;Toilet riser;Cane - single point;Rollator (4 wheels);Lift chair      Prior Function Prior Level of Function : Independent/Modified Independent             Mobility Comments: AD as needed ADLs Comments: mod I, sleeps in a lift chair  for pain control     Extremity/Trunk Assessment   Upper Extremity Assessment Upper Extremity Assessment: Defer to OT evaluation    Lower Extremity Assessment Lower Extremity Assessment: Generalized weakness    Cervical / Trunk  Assessment Cervical / Trunk Assessment: Back Surgery  Communication   Communication Communication: No apparent difficulties  Cognition Arousal: Alert Behavior During Therapy: WFL for tasks assessed/performed Overall Cognitive Status: Impaired/Different from baseline Area of Impairment: Memory                     Memory: Decreased short-term memory, Decreased recall of precautions         General Comments: Spouse noted decr with multiple pain meds this AM        General Comments General comments (skin integrity, edema, etc.): VSS on RA, family present    Exercises Other Exercises Other Exercises: repeated sit to stand x 3   Assessment/Plan    PT Assessment Patient needs continued PT services  PT Problem List Decreased strength;Decreased balance;Decreased mobility;Decreased knowledge of precautions       PT Treatment Interventions DME instruction;Gait training;Stair training;Functional mobility training;Therapeutic activities;Therapeutic exercise;Balance training;Patient/family education    PT Goals (Current goals can be found in the Care Plan section)  Acute Rehab PT Goals Patient Stated Goal: go home PT Goal Formulation: With patient Time For Goal Achievement: 10/15/23 Potential to Achieve Goals: Good    Frequency Min 1X/week     Co-evaluation               AM-PAC PT "6 Clicks" Mobility  Outcome Measure Help needed turning from your back to your side while in a flat bed without using bedrails?: A Little Help needed moving from lying on your back to sitting on the side of a flat bed without using bedrails?: A Little Help needed moving to and from a bed to a chair (including a wheelchair)?: A Little Help needed standing up from a chair using your arms (e.g., wheelchair or bedside chair)?: A Little Help needed to walk in hospital room?: A Little Help needed climbing 3-5 steps with a railing? : A Little 6 Click Score: 18    End of Session    Activity Tolerance: Patient tolerated treatment well Patient left: in chair;with call bell/phone within reach;with family/visitor present Nurse Communication: Mobility status PT Visit Diagnosis: Other abnormalities of gait and mobility (R26.89);Pain Pain - part of body:  (back)    Time: 1610-9604 PT Time Calculation (min) (ACUTE ONLY): 21 min   Charges:   PT Evaluation $PT Eval Low Complexity: 1 Low   PT General Charges $$ ACUTE PT VISIT: 1 Visit         [facility] PT Acute Rehabilitation Services Office [phone]   Angelina Ok [facility] 10/08/2023, 12:53 PM

## 2023-10-08 NOTE — Evaluation (Signed)
Occupational Therapy Evaluation Patient Details Name: Theresa Holmes MRN: [id number] DOB: 1940/12/28 Today's Date: 10/08/2023   History of Present Illness Theresa Holmes is an 82 year old female who underwent REVISION LUMBAR ONE-TWO LAMINECTOMY, REAIR CEREBRO SPINAL FLUID LEAK 11/12. PMHx: arthritis, GERD, kidney stones, hypercholesterolemia, HLD, HTN, hypothyroidism   Clinical Impression   Theresa Holmes was evaluated s/p the above admission list. She is mod I at baseline, she lives with her husband who can assist as needed at d/c. Upon evaluation the pt was limited by surgical pain, spinal precautions, knowledge of compensatory techniques, generalized weakness and decreased activity tolerance. Overall she needed CGA for transfers and mobility with RW and up to mod A for LB ADLs to maintain back precautions. Maximal education provided throughout. Pt will benefit from continued acute OT services and d/c home with support of family as needed.          If plan is discharge home, recommend the following: A little help with walking and/or transfers;A lot of help with bathing/dressing/bathroom;Assistance with cooking/housework;Direct supervision/assist for medications management;Direct supervision/assist for financial management;Help with stairs or ramp for entrance;Assist for transportation    Functional Status Assessment  Patient has had a recent decline in their functional status and demonstrates the ability to make significant improvements in function in a reasonable and predictable amount of time.  Equipment Recommendations  None recommended by OT       Precautions / Restrictions Precautions Precautions: Back;Fall Precaution Booklet Issued: Yes (comment) Restrictions Weight Bearing Restrictions: No      Mobility Bed Mobility Overal bed mobility: Needs Assistance             General bed mobility comments: OOB upon arrival, verablly recalled log roll    Transfers Overall transfer  level: Needs assistance Equipment used: Rolling walker (2 wheels) Transfers: Sit to/from Stand Sit to Stand: Contact guard assist                  Balance Overall balance assessment: Needs assistance Sitting-balance support: Feet supported Sitting balance-Leahy Scale: Fair     Standing balance support: No upper extremity supported, During functional activity Standing balance-Leahy Scale: Poor Standing balance comment: needs at least 1 UE supported externally         ADL either performed or assessed with clinical judgement   ADL Overall ADL's : Needs assistance/impaired Eating/Feeding: Independent   Grooming: Supervision/safety;Standing;Cueing for compensatory techniques   Upper Body Bathing: Set up;Sitting   Lower Body Bathing: Moderate assistance;Sit to/from stand   Upper Body Dressing : Set up;Sitting   Lower Body Dressing: Moderate assistance;Sit to/from stand   Toilet Transfer: Contact guard assist;Ambulation;Rolling walker (2 wheels)   Toileting- Clothing Manipulation and Hygiene: Contact guard assist;Cueing for back precautions;Sitting/lateral lean       Functional mobility during ADLs: Contact guard assist;Rolling walker (2 wheels) General ADL Comments: cues for spinal precuations during LB ADLs, limited standing tolerance at the sink     Vision Baseline Vision/History: 1 Wears glasses Vision Assessment?: No apparent visual deficits     Perception Perception: Within Functional Limits       Praxis Praxis: WFL       Pertinent Vitals/Pain Pain Assessment Pain Assessment: Faces Faces Pain Scale: Hurts little more Pain Location: back Pain Descriptors / Indicators: Discomfort, Grimacing Pain Intervention(s): Monitored during session, Limited activity within patient's tolerance     Extremity/Trunk Assessment Upper Extremity Assessment Upper Extremity Assessment: Generalized weakness   Lower Extremity Assessment Lower Extremity Assessment:  Defer to PT evaluation  Cervical / Trunk Assessment Cervical / Trunk Assessment: Back Surgery   Communication Communication Communication: No apparent difficulties   Cognition Arousal: Alert Behavior During Therapy: WFL for tasks assessed/performed Overall Cognitive Status: Within Functional Limits for tasks assessed               General Comments: Needed reinforcement for spinal precuations throguhout     General Comments  VSS on RA, family present     Home Living Family/patient expects to be discharged to:: Private residence Living Arrangements: Spouse/significant other Available Help at Discharge: Family;Available 24 hours/day Type of Home: House Home Access: Stairs to enter [employer] of Steps: 3 Entrance Stairs-Rails: Right Home Layout: Two level;Able to live on main level with bedroom/bathroom     Bathroom Shower/Tub: Arts development officer Toilet: Handicapped height     Home Equipment: Agricultural consultant (2 wheels);Shower seat;Grab bars - toilet;Grab bars - tub/shower;Toilet riser;Cane - single point;Rollator (4 wheels);Lift chair          Prior Functioning/Environment Prior Level of Function : Independent/Modified Independent             Mobility Comments: AD as needed ADLs Comments: mod I, sleeps in a lift chair for pain control        OT Problem List: Decreased strength;Decreased range of motion;Decreased activity tolerance;Impaired balance (sitting and/or standing);Decreased knowledge of precautions;Decreased knowledge of use of DME or AE;Decreased safety awareness;Pain      OT Treatment/Interventions: Self-care/ADL training;Therapeutic exercise;DME and/or AE instruction;Therapeutic activities;Balance training;Patient/family education    OT Goals(Current goals can be found in the care plan section) Acute Rehab OT Goals Patient Stated Goal: less pain OT Goal Formulation: With patient Time For Goal Achievement:  10/22/23 Potential to Achieve Goals: Good ADL Goals Pt Will Perform Grooming: with modified independence;standing Pt Will Perform Lower Body Dressing: with supervision;sit to/from stand Additional ADL Goal #1: Pt will indep maintain spinal precautions during functional activity  OT Frequency: Min 1X/week       AM-PAC OT "6 Clicks" Daily Activity     Outcome Measure Help from another person eating meals?: None Help from another person taking care of personal grooming?: A Little Help from another person toileting, which includes using toliet, bedpan, or urinal?: A Little Help from another person bathing (including washing, rinsing, drying)?: A Lot Help from another person to put on and taking off regular upper body clothing?: A Little Help from another person to put on and taking off regular lower body clothing?: A Lot 6 Click Score: 17   End of Session Equipment Utilized During Treatment: Rolling walker (2 wheels) Nurse Communication: Mobility status  Activity Tolerance: Patient tolerated treatment well Patient left: in chair;with call bell/phone within reach (going to CT)  OT Visit Diagnosis: Unsteadiness on feet (R26.81);Other abnormalities of gait and mobility (R26.89);Muscle weakness (generalized) (M62.81);Pain                Time: 6578-4696 OT Time Calculation (min): 24 min Charges:  OT General Charges $OT Visit: 1 Visit OT Evaluation $OT Eval Moderate Complexity: 1 Mod OT Treatments $Self Care/Home Management : 8-22 mins  Derenda Mis, OTR/L Acute Rehabilitation Services Office [phone] Secure Chat Communication Preferred   Donia Pounds 10/08/2023, 12:07 PM

## 2023-10-08 NOTE — Consult Note (Signed)
Initial Consultation Note   Patient: Theresa Holmes HQI:[id number] DOB: [birth year] PCP: Adrian Prince, MD DOA: 10/07/2023 DOS: the patient was seen and examined on 10/08/2023 Primary service: Barnett Abu, MD  Referring physician: Dr. Danielle Dess Reason for consult: Cough  Assessment/Plan:  [name] is a 82 year old female with recent laminectomy L1-L2 3 weeks ago who had persistent back pain.  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  Patient underwent reexploration of laminectomy L1-L2 with closure of CSF leak along with decompression of the meningocele with Dr. Danielle Dess on 11/12. Assessment and Plan:  Status post L1-L2 laminectomy and discectomy with pseudomeningocele Patient postop day 1 from reexploration of laminotomy L1-L2 with closure of CSF leak, and decompression of pseudomeningocele. -Per Dr. Danielle Dess  Cough Patient reportedly has been having a persistent cough over the last 2 to 3 weeks.  She had been tested previously for COVID-19 and noted to be negative.  Patient had been on Augmentin for the last 2 weeks without any change in symptoms.  Lower suspicion for infection at this time.  Dr. Danielle Dess and ordered CT scan of the chest, but official radiology report pending.  Suspect symptoms could all be secondary from atelectasis as patient has been dealing with back pain. -Continue incentive spirometry -Flutter valve every 4 hours -Check procalcitonin -Check complete respiratory panel -High-dose Mucinex to try mobilize secretions -Tessalon Perles as needed for cough    TRH will continue to follow the patient.  HPI: Theresa Holmes is a 82 y.o. female with past medical history of hypertension, hyperlipidemia, hypothyroidism, chronic back pain, nephrolithiasis, arthritis, and GERD who presents due to back pain.  She had initially undergone laminectomy of L1-L2 with Dr. Danielle Dess 3 weeks ago.  However postoperatively developed a pseudomeningocele noted on MRI of the spine from  11/8.  Patient reexploration of laminotomy L1-L2 with closure of CSF leak, and decompression of pseudomeningocele on 11/12.  Overnight patient reportedly had episode of severe coughing.  It seems that over the last 2-3 weeks and even prior to her surgery patient had developed this deep cough that was nonproductive.  At home she had not had any fevers and O2 saturations had remained stable.  No chest x-ray had been obtained.  She had been checked previously for COVID and noted to be negative.  He had placed her on Augmentin which she had been taking for the last 2 weeks and intermittently using albuterol neb treatments without improvement.  Patient denies having any significant wheezing, chest pain, nausea, vomiting, diarrhea, or leg swelling.  Patient has no significant history of tobacco abuse.  Dr. Danielle Dess had ordered CT scans of the chest lumbar spine. Review of Systems: As mentioned in the history of present illness. All other systems reviewed and are negative. Past Medical History:  Diagnosis Date   Arthritis    Atypical chest pain 10/22/2016   Chronic back pain    Exogenous obesity    GERD (gastroesophageal reflux disease)    H/O hiatal hernia    History of kidney stones    Hypercholesterolemia    Hyperlipidemia    Hypertension    12/14/2018 pt. denies   Hypothyroidism    HX PARTIAL THYROIDECTOMY FOR  NODULE- BENIGN   Shingles 11/2012   Spinal stenosis    Thyroid mass    right, benign   Varicose vein    Past Surgical History:  Procedure Laterality Date   ABDOMINAL HYSTERECTOMY  1985   ANTERIOR CERVICAL DECOMP/DISCECTOMY FUSION N/A 06/17/2022   Procedure: ANTERIOR CERVICAL  DISCECTOMY AND FUSION CERVICAL FIVE TO SEVEN;  Surgeon: Venita Lick, MD;  Location: [facility] OR;  Service: Orthopedics;  Laterality: N/A;  3.5 hrs 3 C-bed   ANTERIOR LAT LUMBAR FUSION N/A 12/16/2018   Procedure: ANTERIOR LATERAL LUMBAR FUSION L 3-4 with lateral plate;  Surgeon: Venita Lick, MD;  Location: [facility] OR;   Service: Orthopedics;  Laterality: N/A;  3 1/2 hours   APPENDECTOMY     BREAST BIOPSY  9/05   stereotactic   CARPAL TUNNEL RELEASE  12/15   right wrist   CHOLECYSTECTOMY     COMBINED AUGMENTATION MAMMAPLASTY AND ABDOMINOPLASTY  1977   EYE SURGERY  12/05   retinal tear-left   HAMMER TOE SURGERY  2002   HERNIA REPAIR  2004   HIATAL HERNIA REPAIR N/A 01/21/2014   Procedure: LAPAROSCOPIC REPAIR RECURRENT  HIATAL HERNIA REDO NISSEN, upper endoscopy;  Surgeon: Valarie Merino, MD;  Location: [facility];  Service: General;  Laterality: N/A;   KIDNEY STONE SURGERY  2002   NISSEN FUNDOPLICATION     with cholecystectomy   ROTATOR CUFF REPAIR  2001   ROTATOR CUFF REPAIR  1/12    left   ROTATOR CUFF REPAIR  5/13    open   SPINAL FUSION  2005   L4  & L5   THYROIDECTOMY, PARTIAL     TONSILLECTOMY     TONSILLECTOMY AND ADENOIDECTOMY     as a child   UPPER ENDOSCOPY W/ SCLEROTHERAPY  2000   VARICOSE VEIN SURGERY  1977   Social History:  reports that she has never smoked. She has never used smokeless tobacco. She reports current alcohol use of about 1.0 standard drink of alcohol per week. She reports that she does not use drugs.  No Known Allergies  Family History  Problem Relation Age of Onset   Pulmonary embolism Mother        amniotic fluid embolism   Kidney failure Father        complete shut down   Heart failure Father    Breast cancer Maternal Grandmother 80   Alzheimer's disease Cousin     Prior to Admission medications   Medication Sig Start Date End Date Taking? Authorizing Provider  atorvastatin (LIPITOR) 10 MG tablet Take 10 mg by mouth 2 (two) times a week. Takes on Wednesday and Sunday   Yes [provider]  diazepam (VALIUM) 5 MG tablet Take 5 mg by mouth every 6 (six) hours as needed for muscle spasms.   Yes [provider]  donepezil (ARICEPT) 10 MG tablet Take 10 mg by mouth as needed (dementia).   Yes [provider]  gabapentin (NEURONTIN)  300 MG capsule Take 300 mg by mouth 2 (two) times daily as needed (For pain).   Yes [provider]  levothyroxine (SYNTHROID) 125 MCG tablet Take 125 mcg by mouth daily before breakfast.   Yes [provider]  omeprazole (PRILOSEC) 40 MG capsule Take 40 mg by mouth daily.   Yes [provider]  oxyCODONE-acetaminophen (PERCOCET/ROXICET) 5-325 MG tablet Take 1-2 tablets by mouth every 4 (four) hours as needed for severe pain (pain score 7-10).   Yes [provider]  predniSONE (DELTASONE) 10 MG tablet Take 10 mg by mouth 3 (three) times daily as needed (Inflamation per spouse).   Yes [provider]  temazepam (RESTORIL) 15 MG capsule Take 15 mg by mouth at bedtime as needed for sleep.   Yes [provider]    Physical Exam:  Vitals:   10/07/23 1923 10/07/23 2324 10/08/23 0407 10/08/23 0816  BP: (!) 164/71 (!) 108/56 (!) 145/74 (!) 84/48  Pulse: 63 (!) 55 (!) 54 82  Resp: 18 18 18 18   Temp: 98.6 F (37 C) 98 F (36.7 C) 97.7 F (36.5 C) 98.1 F (36.7 C)  TempSrc: Oral Oral Oral Oral  SpO2: 94% 92% 95% 96%  Weight:      Height:        Constitutional: Elderly female currently in no acute distress Eyes: PERRL, lids and conjunctivae normal ENMT: Mucous membranes are moist.  Normal dentition.  Neck: normal, supple  Respiratory: Some decreased overall aeration with some crackles noted in the lower lung fields.  No significant wheezing or rhonchi appreciated.  Patient able to talk in complete sentences. Cardiovascular: Regular rate and rhythm, no murmurs / rubs / gallops. No extremity edema. 2+ pedal pulses. No carotid bruits.  Abdomen: no tenderness, no masses palpated.  Bowel sounds positive.  Musculoskeletal: no clubbing / cyanosis. No joint deformity upper and lower extremities. Good ROM, no contractures. Normal muscle tone.  Skin: Mild erythema noted of the lateral side of the left ankle where patient has a scab Neurologic: CN 2-12  grossly intact. Sensation intact, DTR normal. Strength 5/5 in all 4.  Psychiatric: Normal judgment and insight. Alert and oriented x 3. Normal mood.   Data Reviewed:   Reviewed labs, imaging, and pertinent records as documented   Family Communication: Husband and family updated at bedside Primary team communication:  Thank you very much for involving [place] in the care of your patient.  Author: Clydie Braun, MD 10/08/2023 9:11 AM  For on call review [email or web address].

## 2023-10-08 NOTE — Progress Notes (Signed)
Patient ID: Theresa Holmes, female   DOB: December 22, 1940, 82 y.o.   MRN: [id number] Vital signs are stable Patient rested comfortably until the middle of the night when she had a significant coughing episode she became very uncomfortable in bed with pain in the back and into the legs bilaterally seem to be worse on the left.  Today her incision is clean and dry.  Patient notes that she felt better sitting up.  Her husband was here all night and notes that the pain started rather acutely after she awoke with severe coughing episode.  The cough has been chronic now for several weeks.  Preoperatively we planned on doing a chest x-ray when she had her MRI as he notes that she was being treated with some albuterol nebulizers prescribed by her primary care.  She has not had a chest x-ray yet.  I will ask the hospitalist to see the patient regards to any pulmonary issues and whether further nebs or cough suppressant would be appropriate.  This may be straining the surgical area creating the pain.  Will see how she does today get her out of bed and ambulate.  Physical therapy and Occupational Therapy to see and assess.

## 2023-10-09 MED ORDER — OXYCODONE-ACETAMINOPHEN 5-325 MG PO TABS: 1.0000 | ORAL_TABLET | ORAL | 0 refills | Status: DC | PRN

## 2023-10-09 MED ORDER — DEXAMETHASONE 1 MG PO TABS: ORAL_TABLET | ORAL | 0 refills | Status: DC

## 2023-10-09 NOTE — Progress Notes (Signed)
Patient alert and oriented, ambulate, void. Surgical site clean and dry no sign of infection. D/c instructions explain and given to the patient and husband all questions answered.

## 2023-10-09 NOTE — Progress Notes (Signed)
Patient ID: Theresa Holmes, female   DOB: 07/09/1941, 82 y.o.   MRN: [id number] Vital signs are stable and patient is awake alert and moving about.  Searing pain in left lower extremity is improved over the past 24 hours.  She is ready for discharge home.  Motor strength is good in the proximal leg in the iliopsoas and the quad.

## 2023-10-09 NOTE — Discharge Summary (Signed)
Physician Discharge Summary  Patient ID: Theresa Holmes MRN: [id number] DOB/AGE: 81-Oct-1942 81 y.o.  Admit date: 10/07/2023 Discharge date: 10/09/2023  Admission Diagnoses: Pseudomeningocele lumbar spine.  Chronic left lumbar radiculopathy.  Discharge Diagnoses: Pseudomeningocele lumbar spine.  Chronic left lumbar radiculopathy.  Chronic cough. Principal Problem:   Pseudomeningocele, acquired Active Problems:   Cough   Discharged Condition: good  Hospital Course: Patient was admitted to undergo repair of a pseudomeningocele at L1-L2 from previous laminectomy.  She tolerated surgery well however she experienced some significant episode of coughing which aggravated similar pain in a radicular fashion on the left side.  This was treated conservatively.  Hospitalist consultation was obtained.  Consults:  Hospitalist  Significant Diagnostic Studies: None  Treatments: surgery: See op note  Discharge Exam: Blood pressure (!) 145/73, pulse (!) 52, temperature 98 F (36.7 C), temperature source Oral, resp. rate 18, height 5\' 2"  (1.575 m), weight 74.8 kg, last menstrual period 11/26/1983, SpO2 100%. Incision/Wound: Incision is clean and dry.  Station and gait are intact.  Disposition: Discharge disposition: 01-Home or Self Care       Discharge Instructions     Call MD for:  redness, tenderness, or signs of infection (pain, swelling, redness, odor or green/yellow discharge around incision site)   Complete by: As directed    Call MD for:  severe uncontrolled pain   Complete by: As directed    Call MD for:  temperature >100.4   Complete by: As directed    Diet - low sodium heart healthy   Complete by: As directed    Discharge instructions   Complete by: As directed    Okay to shower. Do not apply salves or appointments to incision. No heavy lifting with the upper extremities greater than 10 pounds. May resume driving when not requiring pain medication and patient feels  comfortable with doing so.   Incentive spirometry RT   Complete by: As directed    Increase activity slowly   Complete by: As directed       Allergies as of 10/09/2023   No Known Allergies      Medication List     TAKE these medications    atorvastatin 10 MG tablet Commonly known as: LIPITOR Take 10 mg by mouth 2 (two) times a week. Takes on Wednesday and Sunday   dexamethasone 1 MG tablet Commonly known as: DECADRON 2 tablets twice daily for 3 days, one tablet twice daily for 3 days, one tablet daily for 3 days.   diazepam 5 MG tablet Commonly known as: VALIUM Take 5 mg by mouth every 6 (six) hours as needed for muscle spasms.   donepezil 10 MG tablet Commonly known as: ARICEPT Take 10 mg by mouth as needed (dementia).   gabapentin 300 MG capsule Commonly known as: NEURONTIN Take 300 mg by mouth 2 (two) times daily as needed (For pain).   levothyroxine 125 MCG tablet Commonly known as: SYNTHROID Take 125 mcg by mouth daily before breakfast.   omeprazole 40 MG capsule Commonly known as: PRILOSEC Take 40 mg by mouth daily.   oxyCODONE-acetaminophen 5-325 MG tablet Commonly known as: PERCOCET/ROXICET Take 1-2 tablets by mouth every 4 (four) hours as needed for severe pain (pain score 7-10).   predniSONE 10 MG tablet Commonly known as: DELTASONE Take 10 mg by mouth 3 (three) times daily as needed (Inflamation per spouse).   temazepam 15 MG capsule Commonly known as: RESTORIL Take 15 mg by mouth at bedtime as needed for sleep.  Signed: Shary Key Honest Safranek 10/09/2023, 7:54 AM

## 2023-10-09 NOTE — Progress Notes (Signed)
Physical Therapy Treatment  Patient Details Name: Theresa Holmes MRN: [id number] DOB: 11/07/1941 Today's Date: 10/09/2023   History of Present Illness Theresa Holmes is an 82 year old female who underwent REVISION LUMBAR ONE-TWO LAMINECTOMY, REAIR CEREBRO SPINAL FLUID LEAK 11/12. PMHx: arthritis, GERD, kidney stones, hypercholesterolemia, HLD, HTN, hypothyroidism    PT Comments  Pt progressing well with post-op mobility. She was able to demonstrate transfers and ambulation with gross CGA to min assist and RW for support. Reinforced education on precautions, positioning recommendations, appropriate activity progression, and car transfer. Will continue to follow.      If plan is discharge home, recommend the following: Help with stairs or ramp for entrance;Assist for transportation;Assistance with cooking/housework   Can travel by private vehicle        Equipment Recommendations  None recommended by PT    Recommendations for Other Services       Precautions / Restrictions Precautions Precautions: Back;Fall Precaution Booklet Issued: Yes (comment) Precaution Comments: Reviewed handout and pt was cued for precautions during functional mobility. Restrictions Weight Bearing Restrictions: No     Mobility  Bed Mobility Overal bed mobility: Needs Assistance Bed Mobility: Rolling, Sidelying to Sit, Sit to Sidelying Rolling: Supervision Sidelying to sit: Contact guard assist     Sit to sidelying: Min assist General bed mobility comments: VC's for optimal log roll technique. Assist to elevate LE's back up into bed at end of session. HOB flat and rails lowered to simulate home environment.    Transfers Overall transfer level: Needs assistance Equipment used: Rolling walker (2 wheels) Transfers: Sit to/from Stand Sit to Stand: Supervision           General transfer comment: VC's for hand palcement on seated surface for safety. No assist required for power up to full stand.     Ambulation/Gait Ambulation/Gait assistance: Contact guard assist Gait Distance (Feet): 175 Feet Assistive device: Rolling walker (2 wheels) Gait Pattern/deviations: Step-through pattern, Decreased stride length, Trunk flexed Gait velocity: Decreased Gait velocity interpretation: 1.31 - 2.62 ft/sec, indicative of limited community ambulator   General Gait Details: VC's for improved posture, closer walker proximity and forward gaze. Slow but generally steady with the RW for support.   Stairs Stairs: Yes Stairs assistance: Min assist Stair Management: One rail Right, Step to pattern, Forwards Number of Stairs: 4 General stair comments: HHA on the L. Min assist throughout for power up to next stair.   Wheelchair Mobility     Tilt Bed    Modified Rankin (Stroke Patients Only)       Balance Overall balance assessment: Needs assistance Sitting-balance support: Feet supported Sitting balance-Leahy Scale: Fair     Standing balance support: No upper extremity supported, During functional activity Standing balance-Leahy Scale: Poor Standing balance comment: UE support                            Cognition Arousal: Alert Behavior During Therapy: WFL for tasks assessed/performed Overall Cognitive Status: Within Functional Limits for tasks assessed                                          Exercises      General Comments        Pertinent Vitals/Pain Pain Assessment Pain Assessment: Faces Faces Pain Scale: Hurts a little bit Pain Location: back Pain Descriptors / Indicators: Discomfort, Grimacing,  Operative site guarding Pain Intervention(s): Limited activity within patient's tolerance, Monitored during session, Repositioned    Home Living                          Prior Function            PT Goals (current goals can now be found in the care plan section) Acute Rehab PT Goals Patient Stated Goal: Home today. PT Goal  Formulation: With patient Time For Goal Achievement: 10/15/23 Potential to Achieve Goals: Good Progress towards PT goals: Progressing toward goals    Frequency    Min 1X/week      PT Plan      Co-evaluation              AM-PAC PT "6 Clicks" Mobility   Outcome Measure  Help needed turning from your back to your side while in a flat bed without using bedrails?: A Little Help needed moving from lying on your back to sitting on the side of a flat bed without using bedrails?: A Little Help needed moving to and from a bed to a chair (including a wheelchair)?: A Little Help needed standing up from a chair using your arms (e.g., wheelchair or bedside chair)?: A Little Help needed to walk in hospital room?: A Little Help needed climbing 3-5 steps with a railing? : A Little 6 Click Score: 18    End of Session Equipment Utilized During Treatment: Gait belt Activity Tolerance: Patient tolerated treatment well Patient left: in bed;with call bell/phone within reach;with family/visitor present Nurse Communication: Mobility status PT Visit Diagnosis: Other abnormalities of gait and mobility (R26.89);Pain Pain - part of body:  (back)     Time: 0900-0930 PT Time Calculation (min) (ACUTE ONLY): 30 min  Charges:    $Gait Training: 23-37 mins PT General Charges $$ ACUTE PT VISIT: 1 Visit                     Theresa Holmes, PT, DPT Acute Rehabilitation Services Secure Chat Preferred Office: [phone]    Theresa Holmes 10/09/2023, 10:14 AM

## 2023-10-09 NOTE — Plan of Care (Signed)
  Problem: Education: Goal: Knowledge of General Education information will improve Description: Including pain rating scale, medication(s)/side effects and non-pharmacologic comfort measures Outcome: Completed/Met   Problem: Health Behavior/Discharge Planning: Goal: Ability to manage health-related needs will improve Outcome: Completed/Met   Problem: Clinical Measurements: Goal: Ability to maintain clinical measurements within normal limits will improve Outcome: Completed/Met Goal: Will remain free from infection Outcome: Completed/Met Goal: Diagnostic test results will improve Outcome: Completed/Met Goal: Respiratory complications will improve Outcome: Completed/Met Goal: Cardiovascular complication will be avoided Outcome: Completed/Met   Problem: Activity: Goal: Risk for activity intolerance will decrease Outcome: Completed/Met   Problem: Nutrition: Goal: Adequate nutrition will be maintained Outcome: Completed/Met   Problem: Coping: Goal: Level of anxiety will decrease Outcome: Completed/Met   Problem: Elimination: Goal: Will not experience complications related to bowel motility Outcome: Completed/Met Goal: Will not experience complications related to urinary retention Outcome: Completed/Met   Problem: Pain Management: Goal: General experience of comfort will improve Outcome: Completed/Met   Problem: Safety: Goal: Ability to remain free from injury will improve Outcome: Completed/Met   Problem: Skin Integrity: Goal: Risk for impaired skin integrity will decrease Outcome: Completed/Met   Problem: Education: Goal: Ability to verbalize activity precautions or restrictions will improve Outcome: Completed/Met Goal: Knowledge of the prescribed therapeutic regimen will improve Outcome: Completed/Met Goal: Understanding of discharge needs will improve Outcome: Completed/Met   Problem: Activity: Goal: Ability to avoid complications of mobility impairment will  improve Outcome: Completed/Met Goal: Ability to tolerate increased activity will improve Outcome: Completed/Met Goal: Will remain free from falls Outcome: Completed/Met   Problem: Pain Management: Goal: Pain level will decrease Outcome: Completed/Met   Problem: Bladder/Genitourinary: Goal: Urinary functional status for postoperative course will improve Outcome: Completed/Met   Problem: Health Behavior/Discharge Planning: Goal: Identification of resources available to assist in meeting health care needs will improve Outcome: Completed/Met   Problem: Skin Integrity: Goal: Will show signs of wound healing Outcome: Completed/Met

## 2023-10-10 ENCOUNTER — Telehealth: Payer: Self-pay | Admitting: Internal Medicine

## 2023-10-10 NOTE — Telephone Encounter (Signed)
Pt husband (Dr. Darrelyn Hillock) will like Dr. Sherene Sires to call him, he has a question about his wife [phone]

## 2023-10-20 NOTE — Telephone Encounter (Signed)
Discussed CT chest with Dr Darrelyn Hillock re ? PH > rec echo but not urgent in this setting and reassured many of these scans are false positive for [facility]

## 2023-11-12 ENCOUNTER — Encounter (HOSPITAL_COMMUNITY): Payer: Self-pay | Admitting: Neurological Surgery

## 2023-11-12 NOTE — Progress Notes (Signed)
SDW call  Patient's husband, Dr. Darrelyn Hillock was given pre-op instructions over the phone. He verbalized understanding of instructions provided.     PCP - Dr. Adrian Prince Cardiologist -  Pulmonary:    PPM/ICD - denies Device Orders - na Rep Notified - na   Chest x-ray - na EKG -  10/07/2023 Stress Test -10/30/2016 ECHO -  Cardiac Cath -   Sleep Study/sleep apnea/CPAP: denies  Non-diabetic  Blood Thinner Instructions: denies Aspirin Instructions:denies   ERAS Protcol - Clears until 0715   COVID TEST- na    Anesthesia review: Yes.  Difficult airway, HTN, high cholesterol   Patient denies shortness of breath, fever, cough and chest pain over the phone call  Your procedure is scheduled on Thursday November 13, 2023  Report to [facility] Main Entrance "A" at  0745  A.M., then check in with the Admitting office.  Call this number if you have problems the morning of surgery:  [phone]   If you have any questions prior to your surgery date call [phone]: Open Monday-Friday 8am-4pm If you experience any cold or flu symptoms such as cough, fever, chills, shortness of breath, etc. between now and your scheduled surgery, please notify us at the above number    Remember:  Do not eat after midnight the night before your surgery  You may drink clear liquids until  0715   the morning of your surgery.   Clear liquids allowed are: Water, Non-Citrus Juices (without pulp), Carbonated Beverages, Clear Tea, Black Coffee ONLY (NO MILK, CREAM OR POWDERED CREAMER of any kind), and Gatorade   Take these medicines the morning of surgery with A SIP OF WATER:  Atorvastatin, doxycycline, gemtesa, levothyroxine, prilosec  As needed: Flexeril, valium, gabapentin, percocet  As of today, STOP taking any Aspirin (unless otherwise instructed by your surgeon) Aleve, Naproxen, Ibuprofen, Motrin, Advil, Goody's, BC's, all herbal medications, fish oil, and all vitamins.

## 2023-11-13 ENCOUNTER — Inpatient Hospital Stay (HOSPITAL_COMMUNITY): Payer: No Typology Code available for payment source

## 2023-11-13 ENCOUNTER — Inpatient Hospital Stay (HOSPITAL_COMMUNITY): Payer: Self-pay | Admitting: Physician Assistant

## 2023-11-13 ENCOUNTER — Encounter (HOSPITAL_COMMUNITY)
Admission: RE | Disposition: A | Discharge status: Home Health | Payer: Self-pay | Source: Home / Self Care | Attending: Neurological Surgery

## 2023-11-13 ENCOUNTER — Encounter (HOSPITAL_COMMUNITY): Payer: Self-pay | Admitting: Neurological Surgery

## 2023-11-13 ENCOUNTER — Other Ambulatory Visit: Payer: Self-pay

## 2023-11-13 ENCOUNTER — Inpatient Hospital Stay (HOSPITAL_COMMUNITY)
Admission: RE | Admit: 2023-11-13 | Discharge: 2023-11-15 | DRG: 030 | Disposition: A | Discharge status: Home Health | Payer: No Typology Code available for payment source | Attending: Neurological Surgery | Admitting: Neurological Surgery

## 2023-11-13 DIAGNOSIS — M5116 Intervertebral disc disorders with radiculopathy, lumbar region: Secondary | ICD-10-CM | POA: Diagnosis present

## 2023-11-13 DIAGNOSIS — Z981 Arthrodesis status: Secondary | ICD-10-CM | POA: Diagnosis not present

## 2023-11-13 DIAGNOSIS — Z87442 Personal history of urinary calculi: Secondary | ICD-10-CM | POA: Diagnosis not present

## 2023-11-13 DIAGNOSIS — E039 Hypothyroidism, unspecified: Secondary | ICD-10-CM | POA: Diagnosis present

## 2023-11-13 DIAGNOSIS — Y838 Other surgical procedures as the cause of abnormal reaction of the patient, or of later complication, without mention of misadventure at the time of the procedure: Secondary | ICD-10-CM | POA: Diagnosis present

## 2023-11-13 DIAGNOSIS — Z79899 Other long term (current) drug therapy: Secondary | ICD-10-CM | POA: Diagnosis not present

## 2023-11-13 DIAGNOSIS — Z820 Family history of epilepsy and other diseases of the nervous system: Secondary | ICD-10-CM

## 2023-11-13 DIAGNOSIS — Z9049 Acquired absence of other specified parts of digestive tract: Secondary | ICD-10-CM

## 2023-11-13 DIAGNOSIS — G9782 Other postprocedural complications and disorders of nervous system: Principal | ICD-10-CM | POA: Diagnosis present

## 2023-11-13 DIAGNOSIS — I10 Essential (primary) hypertension: Secondary | ICD-10-CM

## 2023-11-13 DIAGNOSIS — Z841 Family history of disorders of kidney and ureter: Secondary | ICD-10-CM | POA: Diagnosis not present

## 2023-11-13 DIAGNOSIS — Z79890 Hormone replacement therapy (postmenopausal): Secondary | ICD-10-CM

## 2023-11-13 DIAGNOSIS — G96198 Other disorders of meninges, not elsewhere classified: Secondary | ICD-10-CM | POA: Diagnosis present

## 2023-11-13 DIAGNOSIS — Z8249 Family history of ischemic heart disease and other diseases of the circulatory system: Secondary | ICD-10-CM | POA: Diagnosis not present

## 2023-11-13 DIAGNOSIS — Z803 Family history of malignant neoplasm of breast: Secondary | ICD-10-CM

## 2023-11-13 DIAGNOSIS — Z90710 Acquired absence of both cervix and uterus: Secondary | ICD-10-CM | POA: Diagnosis not present

## 2023-11-13 DIAGNOSIS — E7800 Pure hypercholesterolemia, unspecified: Secondary | ICD-10-CM | POA: Diagnosis present

## 2023-11-13 DIAGNOSIS — K219 Gastro-esophageal reflux disease without esophagitis: Secondary | ICD-10-CM | POA: Diagnosis present

## 2023-11-13 DIAGNOSIS — K660 Peritoneal adhesions (postprocedural) (postinfection): Secondary | ICD-10-CM | POA: Diagnosis present

## 2023-11-13 HISTORY — PX: VENTRICULOPERITONEAL SHUNT: SHX204

## 2023-11-13 LAB — BASIC METABOLIC PANEL
Anion gap: 10 (ref 5–15)
BUN: 17 mg/dL (ref 8–23)
CO2: 24 mmol/L (ref 22–32)
Calcium: 8.6 mg/dL — ABNORMAL LOW (ref 8.9–10.3)
Chloride: 105 mmol/L (ref 98–111)
Creatinine, Ser: 0.89 mg/dL (ref 0.44–1.00)
GFR, Estimated: >60 mL/min (ref >60)
Glucose, Bld: 88 mg/dL (ref 70–99)
Potassium: 3.6 mmol/L (ref 3.5–5.1)
Sodium: 139 mmol/L (ref 135–145)

## 2023-11-13 LAB — CBC
HCT: 35.9 % — ABNORMAL LOW (ref 36.0–46.0)
Hemoglobin: 11 g/dL — ABNORMAL LOW (ref 12.0–15.0)
MCH: 24.2 pg — ABNORMAL LOW (ref 26.0–34.0)
MCHC: 30.6 g/dL (ref 30.0–36.0)
MCV: 78.9 fL — ABNORMAL LOW (ref 80.0–100.0)
Platelets: 457 10*3/uL — ABNORMAL HIGH (ref 150–400)
RBC: 4.55 MIL/uL (ref 3.87–5.11)
RDW: 16.9 % — ABNORMAL HIGH (ref 11.5–15.5)
WBC: 10.2 10*3/uL (ref 4.0–10.5)
nRBC: 0 % (ref 0.0–0.2)

## 2023-11-13 LAB — SURGICAL PCR SCREEN
MRSA, PCR: NEGATIVE
Staphylococcus aureus: NEGATIVE

## 2023-11-13 SURGERY — SHUNT INSERTION VENTRICULAR-PERITONEAL
Anesthesia: General

## 2023-11-13 MED ORDER — SENNA 8.6 MG PO TABS
1.0000 | ORAL_TABLET | Freq: Two times a day (BID) | ORAL | Status: DC
  Administered 2023-11-13 – 2023-11-14 (×2): 8.6 mg via ORAL
  Filled 2023-11-13 (×2): qty 1

## 2023-11-13 MED ORDER — CEFAZOLIN SODIUM-DEXTROSE 2-4 GM/100ML-% IV SOLN
2.0000 g | Freq: Three times a day (TID) | INTRAVENOUS | Status: AC
  Administered 2023-11-13 – 2023-11-14 (×2): 2 g via INTRAVENOUS
  Filled 2023-11-13 (×2): qty 100

## 2023-11-13 MED ORDER — PROPOFOL 10 MG/ML IV BOLUS
INTRAVENOUS | Status: AC
  Filled 2023-11-13: qty 20

## 2023-11-13 MED ORDER — BACITRACIN ZINC 500 UNIT/GM EX OINT
TOPICAL_OINTMENT | CUTANEOUS | Status: AC
  Filled 2023-11-13: qty 28.35

## 2023-11-13 MED ORDER — ALUM & MAG HYDROXIDE-SIMETH 200-200-20 MG/5ML PO SUSP: 30.0000 mL | Freq: Four times a day (QID) | ORAL | Status: DC | PRN

## 2023-11-13 MED ORDER — SUCCINYLCHOLINE CHLORIDE 200 MG/10ML IV SOSY
PREFILLED_SYRINGE | INTRAVENOUS | Status: DC | PRN
  Administered 2023-11-13: 140 mg via INTRAVENOUS

## 2023-11-13 MED ORDER — BUPIVACAINE HCL (PF) 0.5 % IJ SOLN
INTRAMUSCULAR | Status: AC
  Filled 2023-11-13: qty 30

## 2023-11-13 MED ORDER — THROMBIN 5000 UNITS EX SOLR
CUTANEOUS | Status: AC
  Filled 2023-11-13: qty 5000

## 2023-11-13 MED ORDER — FENTANYL CITRATE (PF) 250 MCG/5ML IJ SOLN
INTRAMUSCULAR | Status: DC | PRN
  Administered 2023-11-13: 150 ug via INTRAVENOUS
  Administered 2023-11-13: 50 ug via INTRAVENOUS

## 2023-11-13 MED ORDER — ACETAMINOPHEN 500 MG PO TABS
1000.0000 mg | ORAL_TABLET | Freq: Once | ORAL | Status: AC
  Administered 2023-11-13: 1000 mg via ORAL
  Filled 2023-11-13: qty 2

## 2023-11-13 MED ORDER — LEVOTHYROXINE SODIUM 100 MCG PO TABS
125.0000 ug | ORAL_TABLET | Freq: Every day | ORAL | Status: DC
  Administered 2023-11-15: 125 ug via ORAL
  Filled 2023-11-13 (×2): qty 1

## 2023-11-13 MED ORDER — SODIUM CHLORIDE 0.9 % IV SOLN
250.0000 mL | INTRAVENOUS | Status: AC
  Administered 2023-11-13: 250 mL via INTRAVENOUS

## 2023-11-13 MED ORDER — HYDROMORPHONE HCL 1 MG/ML IJ SOLN
0.5000 mg | INTRAMUSCULAR | Status: DC | PRN
  Administered 2023-11-13: 0.5 mg via INTRAVENOUS
  Filled 2023-11-13: qty 0.5

## 2023-11-13 MED ORDER — BISACODYL 5 MG PO TBEC
5.0000 mg | DELAYED_RELEASE_TABLET | Freq: Every day | ORAL | Status: AC | PRN
Start: 2023-11-13 — End: ?

## 2023-11-13 MED ORDER — ONDANSETRON HCL 4 MG PO TABS: 4.0000 mg | ORAL_TABLET | Freq: Four times a day (QID) | ORAL | Status: DC | PRN

## 2023-11-13 MED ORDER — OXYCODONE HCL 5 MG PO TABS
5.0000 mg | ORAL_TABLET | Freq: Once | ORAL | Status: DC | PRN
Start: 2023-11-13 — End: 2023-11-13

## 2023-11-13 MED ORDER — DOCUSATE SODIUM 100 MG PO CAPS
100.0000 mg | ORAL_CAPSULE | Freq: Two times a day (BID) | ORAL | Status: DC
  Administered 2023-11-13 – 2023-11-14 (×2): 100 mg via ORAL
  Filled 2023-11-13 (×2): qty 1

## 2023-11-13 MED ORDER — ROCURONIUM BROMIDE 10 MG/ML (PF) SYRINGE
PREFILLED_SYRINGE | INTRAVENOUS | Status: DC | PRN
  Administered 2023-11-13: 30 mg via INTRAVENOUS

## 2023-11-13 MED ORDER — BISACODYL 10 MG RE SUPP: 10.0000 mg | Freq: Every day | RECTAL | Status: DC | PRN

## 2023-11-13 MED ORDER — SODIUM CHLORIDE 0.9% FLUSH
3.0000 mL | Freq: Two times a day (BID) | INTRAVENOUS | Status: DC
  Administered 2023-11-13 – 2023-11-14 (×2): 3 mL via INTRAVENOUS

## 2023-11-13 MED ORDER — FLEET ENEMA RE ENEM: 1.0000 | ENEMA | Freq: Once | RECTAL | Status: DC | PRN

## 2023-11-13 MED ORDER — DOXYCYCLINE HYCLATE 100 MG PO CAPS: 100.0000 mg | ORAL_CAPSULE | Freq: Two times a day (BID) | ORAL | Status: DC

## 2023-11-13 MED ORDER — CYCLOBENZAPRINE HCL 10 MG PO TABS
10.0000 mg | ORAL_TABLET | Freq: Three times a day (TID) | ORAL | Status: DC | PRN
  Administered 2023-11-13 – 2023-11-14 (×3): 10 mg via ORAL
  Filled 2023-11-13 (×3): qty 1

## 2023-11-13 MED ORDER — CEFAZOLIN SODIUM-DEXTROSE 2-3 GM-%(50ML) IV SOLR
INTRAVENOUS | Status: DC | PRN
  Administered 2023-11-13: 2 g via INTRAVENOUS

## 2023-11-13 MED ORDER — MENTHOL 3 MG MT LOZG: 1.0000 | LOZENGE | OROMUCOSAL | Status: DC | PRN

## 2023-11-13 MED ORDER — DONEPEZIL HCL 10 MG PO TABS
10.0000 mg | ORAL_TABLET | Freq: Every day | ORAL | Status: DC
  Administered 2023-11-13: 10 mg via ORAL
  Filled 2023-11-13 (×2): qty 1

## 2023-11-13 MED ORDER — ORAL CARE MOUTH RINSE
15.0000 mL | Freq: Once | OROMUCOSAL | Status: AC
Start: 2023-11-13 — End: 2023-11-13

## 2023-11-13 MED ORDER — ACETAMINOPHEN 650 MG RE SUPP: 650.0000 mg | RECTAL | Status: DC | PRN

## 2023-11-13 MED ORDER — ONDANSETRON HCL 4 MG/2ML IJ SOLN: 4.0000 mg | Freq: Four times a day (QID) | INTRAMUSCULAR | Status: DC | PRN

## 2023-11-13 MED ORDER — PHENOL 1.4 % MT LIQD: 1.0000 | OROMUCOSAL | Status: DC | PRN

## 2023-11-13 MED ORDER — EPHEDRINE SULFATE-NACL 50-0.9 MG/10ML-% IV SOSY
PREFILLED_SYRINGE | INTRAVENOUS | Status: DC | PRN
  Administered 2023-11-13: 10 mg via INTRAVENOUS

## 2023-11-13 MED ORDER — DEXAMETHASONE SODIUM PHOSPHATE 10 MG/ML IJ SOLN
INTRAMUSCULAR | Status: DC | PRN
  Administered 2023-11-13: 6 mg via INTRAVENOUS

## 2023-11-13 MED ORDER — LIDOCAINE-EPINEPHRINE 1 %-1:100000 IJ SOLN
INTRAMUSCULAR | Status: AC
  Filled 2023-11-13: qty 1

## 2023-11-13 MED ORDER — CHLORHEXIDINE GLUCONATE 0.12 % MT SOLN
15.0000 mL | Freq: Once | OROMUCOSAL | Status: AC
  Administered 2023-11-13: 15 mL via OROMUCOSAL
  Filled 2023-11-13: qty 15

## 2023-11-13 MED ORDER — ACETAMINOPHEN 325 MG PO TABS
325.0000 mg | ORAL_TABLET | ORAL | Status: DC | PRN
Start: 2023-11-13 — End: 2023-11-13

## 2023-11-13 MED ORDER — LIDOCAINE-EPINEPHRINE 1 %-1:100000 IJ SOLN
INTRAMUSCULAR | Status: DC | PRN
  Administered 2023-11-13: 3.5 mL

## 2023-11-13 MED ORDER — GABAPENTIN 300 MG PO CAPS: 300.0000 mg | ORAL_CAPSULE | Freq: Two times a day (BID) | ORAL | Status: DC | PRN

## 2023-11-13 MED ORDER — BUPIVACAINE HCL (PF) 0.5 % IJ SOLN
INTRAMUSCULAR | Status: DC | PRN
  Administered 2023-11-13: 5 mL
  Administered 2023-11-13: 3.5 mL

## 2023-11-13 MED ORDER — TEMAZEPAM 15 MG PO CAPS: 15.0000 mg | ORAL_CAPSULE | Freq: Every evening | ORAL | Status: DC | PRN

## 2023-11-13 MED ORDER — PROPOFOL 500 MG/50ML IV EMUL
INTRAVENOUS | Status: DC | PRN
  Administered 2023-11-13 (×2): 100 ug/kg/min via INTRAVENOUS

## 2023-11-13 MED ORDER — SODIUM CHLORIDE 0.9% FLUSH: 3.0000 mL | INTRAVENOUS | Status: DC | PRN

## 2023-11-13 MED ORDER — PANTOPRAZOLE SODIUM 40 MG PO TBEC
80.0000 mg | DELAYED_RELEASE_TABLET | Freq: Every day | ORAL | Status: DC
  Administered 2023-11-13 – 2023-11-14 (×2): 80 mg via ORAL
  Filled 2023-11-13 (×2): qty 2

## 2023-11-13 MED ORDER — BACITRACIN ZINC 500 UNIT/GM EX OINT
TOPICAL_OINTMENT | CUTANEOUS | Status: DC | PRN
  Administered 2023-11-13: 1 via TOPICAL

## 2023-11-13 MED ORDER — MIRABEGRON ER 25 MG PO TB24
25.0000 mg | ORAL_TABLET | Freq: Every day | ORAL | Status: DC
Start: 2023-11-13 — End: 2023-11-15
  Filled 2023-11-13 (×3): qty 1

## 2023-11-13 MED ORDER — ACETAMINOPHEN 160 MG/5ML PO SOLN: 325.0000 mg | ORAL | Status: DC | PRN

## 2023-11-13 MED ORDER — SODIUM CHLORIDE 0.9 % IV SOLN: INTRAVENOUS | Status: DC

## 2023-11-13 MED ORDER — POLYETHYLENE GLYCOL 3350 17 G PO PACK: 17.0000 g | PACK | Freq: Every day | ORAL | Status: DC | PRN

## 2023-11-13 MED ORDER — ACETAMINOPHEN 325 MG PO TABS: 650.0000 mg | ORAL_TABLET | ORAL | Status: DC | PRN

## 2023-11-13 MED ORDER — OXYCODONE-ACETAMINOPHEN 5-325 MG PO TABS
1.0000 | ORAL_TABLET | ORAL | Status: DC | PRN
  Administered 2023-11-13 (×2): 1 via ORAL
  Administered 2023-11-14 (×3): 2 via ORAL
  Filled 2023-11-13 (×2): qty 1
  Filled 2023-11-13 (×3): qty 2

## 2023-11-13 MED ORDER — HEMOSTATIC AGENTS (NO CHARGE) OPTIME
TOPICAL | Status: DC | PRN
  Administered 2023-11-13: 1

## 2023-11-13 MED ORDER — ADULT MULTIVITAMIN W/MINERALS CH
1.0000 | ORAL_TABLET | Freq: Every day | ORAL | Status: DC
  Administered 2023-11-13 – 2023-11-14 (×2): 1 via ORAL
  Filled 2023-11-13 (×2): qty 1

## 2023-11-13 MED ORDER — DROPERIDOL 2.5 MG/ML IJ SOLN: 0.6250 mg | Freq: Once | INTRAMUSCULAR | Status: DC | PRN

## 2023-11-13 MED ORDER — ONDANSETRON HCL 4 MG/2ML IJ SOLN
INTRAMUSCULAR | Status: DC | PRN
  Administered 2023-11-13: 4 mg via INTRAVENOUS

## 2023-11-13 MED ORDER — OXYCODONE HCL 5 MG/5ML PO SOLN: 5.0000 mg | Freq: Once | ORAL | Status: DC | PRN

## 2023-11-13 MED ORDER — ACETAMINOPHEN 10 MG/ML IV SOLN: 1000.0000 mg | Freq: Once | INTRAVENOUS | Status: DC | PRN

## 2023-11-13 MED ORDER — THROMBIN 5000 UNITS EX SOLR: OROMUCOSAL | Status: DC | PRN

## 2023-11-13 MED ORDER — FENTANYL CITRATE (PF) 250 MCG/5ML IJ SOLN
INTRAMUSCULAR | Status: AC
  Filled 2023-11-13: qty 5

## 2023-11-13 MED ORDER — ATORVASTATIN CALCIUM 10 MG PO TABS: 10.0000 mg | ORAL_TABLET | ORAL | Status: DC

## 2023-11-13 MED ORDER — FENTANYL CITRATE (PF) 100 MCG/2ML IJ SOLN: 25.0000 ug | INTRAMUSCULAR | Status: DC | PRN

## 2023-11-13 SURGICAL SUPPLY — 62 items
BAG COUNTER SPONGE SURGICOUNT (BAG) ×1 IMPLANT
BLADE CLIPPER SURG (BLADE) ×2 IMPLANT
BNDG GAUZE DERMACEA FLUFF 4 (GAUZE/BANDAGES/DRESSINGS) IMPLANT
BNDG STRETCH 4X75 STRL LF (GAUZE/BANDAGES/DRESSINGS) IMPLANT
BUR ACORN 6.0 PRECISION (BURR) ×1 IMPLANT
BUR MATCHSTICK NEURO 3.0 LAGG (BURR) IMPLANT
CANISTER SUCT 3000ML PPV (MISCELLANEOUS) ×1 IMPLANT
CLAMP SUTURE YELLOW 5 PAIRS (MISCELLANEOUS) IMPLANT
CLIP RANEY DISP (INSTRUMENTS) IMPLANT
DERMABOND ADVANCED .7 DNX12 (GAUZE/BANDAGES/DRESSINGS) IMPLANT
DRAPE INCISE IOBAN 85X60 (DRAPES) ×1 IMPLANT
DRAPE SURG ORHT 6 SPLT 77X108 (DRAPES) ×2 IMPLANT
DURAPREP 26ML APPLICATOR (WOUND CARE) ×2 IMPLANT
ELECT REM PT RETURN 9FT ADLT (ELECTROSURGICAL) ×1
ELECTRODE REM PT RTRN 9FT ADLT (ELECTROSURGICAL) ×1 IMPLANT
GAUZE 4X4 16PLY ~~LOC~~+RFID DBL (SPONGE) IMPLANT
GLOVE BIOGEL PI IND STRL 8.5 (GLOVE) ×1 IMPLANT
GLOVE ECLIPSE 8.5 STRL (GLOVE) ×1 IMPLANT
GLOVE EXAM NITRILE XL STR (GLOVE) IMPLANT
GOWN STRL REUS W/ TWL LRG LVL3 (GOWN DISPOSABLE) IMPLANT
GOWN STRL REUS W/ TWL XL LVL3 (GOWN DISPOSABLE) ×1 IMPLANT
GOWN STRL REUS W/TWL 2XL LVL3 (GOWN DISPOSABLE) ×1 IMPLANT
HEMOSTAT SURGICEL 2X14 (HEMOSTASIS) ×1 IMPLANT
INTRODUCER SET COOK 14FR (MISCELLANEOUS) IMPLANT
KIT BASIN OR (CUSTOM PROCEDURE TRAY) ×1 IMPLANT
KIT TURNOVER KIT B (KITS) ×1 IMPLANT
NDL HYPO 25X1 1.5 SAFETY (NEEDLE) ×1 IMPLANT
NEEDLE HYPO 25X1 1.5 SAFETY (NEEDLE) ×1 IMPLANT
NS IRRIG 1000ML POUR BTL (IV SOLUTION) ×1 IMPLANT
PACK LAMINECTOMY NEURO (CUSTOM PROCEDURE TRAY) ×1 IMPLANT
PAD ARMBOARD 7.5X6 YLW CONV (MISCELLANEOUS) ×3 IMPLANT
SET TUBE SMOKE EVAC HIGH FLOW (TUBING) IMPLANT
SHEATH PERITONEAL INTRO 46 (SHEATH) IMPLANT
SHEATH PERITONEAL INTRO 61 (SHEATH) IMPLANT
SHUNT STRATA NEURO SYSTEM (Shunt) IMPLANT
SLEEVE Z-THREAD 5X100MM (TROCAR) IMPLANT
SPIKE FLUID TRANSFER (MISCELLANEOUS) ×1 IMPLANT
SPONGE SURGIFOAM ABS GEL SZ50 (HEMOSTASIS) ×1 IMPLANT
SPONGE T-LAP 4X18 ~~LOC~~+RFID (SPONGE) IMPLANT
STAPLER SKIN PROX WIDE 3.9 (STAPLE) ×1 IMPLANT
STRIP CLOSURE SKIN 1/4X4 (GAUZE/BANDAGES/DRESSINGS) IMPLANT
SUT ETHILON 3 0 PS 1 (SUTURE) IMPLANT
SUT MNCRL AB 4-0 PS2 18 (SUTURE) IMPLANT
SUT NURALON 4 0 TR CR/8 (SUTURE) IMPLANT
SUT SILK 0 100YDS SPOOL (SUTURE) IMPLANT
SUT SILK 1 TIES 10X30 (SUTURE) IMPLANT
SUT SILK 3 0 SH 30 (SUTURE) IMPLANT
SUT SILK PERMA 2-0 1X30 RB-1 (SUTURE) ×1
SUT VIC AB 2-0 CP2 18 (SUTURE) ×1 IMPLANT
SUT VIC AB 3-0 SH 8-18 (SUTURE) ×1 IMPLANT
SUT VIC AB 4-0 RB1 18 (SUTURE) ×1 IMPLANT
SUT VICRYL 3 0 RAPIDE (SUTURE) ×1 IMPLANT
SUTURE SILK PEM 2-0 1X30 RB-1 (SUTURE) ×1 IMPLANT
TAG SUTURE CLAMP YLW 5PR (MISCELLANEOUS) ×1
TOWEL GREEN STERILE (TOWEL DISPOSABLE) ×1 IMPLANT
TOWEL GREEN STERILE FF (TOWEL DISPOSABLE) ×1 IMPLANT
TRAY FOLEY MTR SLVR 16FR STAT (SET/KITS/TRAYS/PACK) IMPLANT
TRAY LAPAROSCOPIC MC (CUSTOM PROCEDURE TRAY) IMPLANT
TROCAR XCEL NON-BLD 5MMX100MML (ENDOMECHANICALS) IMPLANT
TROCAR Z-THREAD OPTICAL 5X100M (TROCAR) IMPLANT
UNDERPAD 30X36 HEAVY ABSORB (UNDERPADS AND DIAPERS) ×1 IMPLANT
WATER STERILE IRR 1000ML POUR (IV SOLUTION) ×1 IMPLANT

## 2023-11-13 NOTE — H&P (Addendum)
Theresa Holmes is an 82 y.o. female.   Chief Complaint: Pseudomeningocele lumbar spine HPI: Patient is an 82 year old individual whose had previous lumbar surgery.  She has spinal fluid leak at the level of L1-L2.  This underwent surgical repair however pseudomeningocele recurred under significant tension.  She is now having a lumboperitoneal shunt catheter placed to divert CSF.  Past Medical History:  Diagnosis Date   Arthritis    Atypical chest pain 10/22/2016   Chronic back pain    Exogenous obesity    GERD (gastroesophageal reflux disease)    H/O hiatal hernia    History of kidney stones    Hypercholesterolemia    Hyperlipidemia    Hypertension    12/14/2018 pt. denies   Hypothyroidism    HX PARTIAL THYROIDECTOMY FOR  NODULE- BENIGN   Shingles 11/2012   Spinal stenosis    Thyroid mass    right, benign   Varicose vein     Past Surgical History:  Procedure Laterality Date   ABDOMINAL HYSTERECTOMY  1985   ANTERIOR CERVICAL DECOMP/DISCECTOMY FUSION N/A 06/17/2022   Procedure: ANTERIOR CERVICAL DISCECTOMY AND FUSION CERVICAL FIVE TO SEVEN;  Surgeon: Venita Lick, MD;  Location: [facility];  Service: Orthopedics;  Laterality: N/A;  3.5 hrs 3 C-bed   ANTERIOR LAT LUMBAR FUSION N/A 12/16/2018   Procedure: ANTERIOR LATERAL LUMBAR FUSION L 3-4 with lateral plate;  Surgeon: Venita Lick, MD;  Location: [facility];  Service: Orthopedics;  Laterality: N/A;  3 1/2 hours   APPENDECTOMY     BREAST BIOPSY  9/05   stereotactic   CARPAL TUNNEL RELEASE  12/15   right wrist   CHOLECYSTECTOMY     COMBINED AUGMENTATION MAMMAPLASTY AND ABDOMINOPLASTY  1977   EYE SURGERY  12/05   retinal tear-left   HAMMER TOE SURGERY  2002   HERNIA REPAIR  2004   HIATAL HERNIA REPAIR N/A 01/21/2014   Procedure: LAPAROSCOPIC REPAIR RECURRENT  HIATAL HERNIA REDO NISSEN, upper endoscopy;  Surgeon: Valarie Merino, MD;  Location: [facility];  Service: General;  Laterality: N/A;   KIDNEY STONE SURGERY  2002   LUMBAR  LAMINECTOMY/DECOMPRESSION MICRODISCECTOMY N/A 10/07/2023   Procedure: REVISION LUMBAR ONE-TWO LAMINECTOMY, REAIR CEREBRO SPINAL FLUID LEAK;  Surgeon: Barnett Abu, MD;  Location: [facility];  Service: Neurosurgery;  Laterality: N/A;   NISSEN FUNDOPLICATION     with cholecystectomy   ROTATOR CUFF REPAIR  2001   ROTATOR CUFF REPAIR  1/12    left   ROTATOR CUFF REPAIR  5/13    open   SPINAL FUSION  2005   L4  & L5   THYROIDECTOMY, PARTIAL     TONSILLECTOMY     TONSILLECTOMY AND ADENOIDECTOMY     as a child   UPPER ENDOSCOPY W/ SCLEROTHERAPY  2000   VARICOSE VEIN SURGERY  1977    Family History  Problem Relation Age of Onset   Pulmonary embolism Mother        amniotic fluid embolism   Kidney failure Father        complete shut down   Heart failure Father    Breast cancer Maternal Grandmother 37   Alzheimer's disease Cousin    Social History:  reports that she has never smoked. She has never used smokeless tobacco. She reports current alcohol use of about 1.0 standard drink of alcohol per week. She reports that she does not use drugs.  Allergies: No Known Allergies  Medications Prior to Admission  Medication Sig Dispense Refill  atorvastatin (LIPITOR) 10 MG tablet Take 10 mg by mouth 2 (two) times a week. Takes on Wednesday and Sunday     diazepam (VALIUM) 5 MG tablet Take 5 mg by mouth every 6 (six) hours as needed for muscle spasms.     donepezil (ARICEPT) 10 MG tablet Take 10 mg by mouth at bedtime.     gabapentin (NEURONTIN) 300 MG capsule Take 300 mg by mouth 2 (two) times daily as needed (For pain).     GEMTESA 75 MG TABS Take 75 mg by mouth daily.     levothyroxine (SYNTHROID) 125 MCG tablet Take 125 mcg by mouth daily before breakfast.     Multiple Vitamins-Minerals (MULTIVITAMIN WITH MINERALS) tablet Take 1 tablet by mouth daily.     omeprazole (PRILOSEC) 40 MG capsule Take 40 mg by mouth daily.     oxyCODONE-acetaminophen (PERCOCET/ROXICET) 5-325 MG tablet Take 1-2  tablets by mouth every 4 (four) hours as needed for severe pain (pain score 7-10). 40 tablet 0   temazepam (RESTORIL) 15 MG capsule Take 15 mg by mouth at bedtime as needed for sleep (every other).     cyclobenzaprine (FLEXERIL) 10 MG tablet Take 10 mg by mouth 3 (three) times daily as needed for muscle spasms.     dexamethasone (DECADRON) 1 MG tablet 2 tablets twice daily for 3 days, one tablet twice daily for 3 days, one tablet daily for 3 days. (Patient not taking: Reported on 11/11/2023) 21 tablet 0   doxycycline (VIBRAMYCIN) 100 MG capsule Take 100 mg by mouth 2 (two) times daily.      Results for orders placed or performed during the hospital encounter of 11/13/23 (from the past 48 hours)  Surgical pcr screen     Status: None   Collection Time: 11/13/23  8:29 AM   Specimen: Nasal Mucosa; Nasal Swab  Result Value Ref Range   MRSA, PCR NEGATIVE NEGATIVE   Staphylococcus aureus NEGATIVE NEGATIVE    Comment: (NOTE) The Xpert SA Assay (FDA approved for NASAL specimens in patients 71 years of age and older), is one component of a comprehensive surveillance program. It is not intended to diagnose infection nor to guide or monitor treatment. Performed at [facility], [address], [place], [place] [address]   Basic metabolic panel per protocol     Status: Abnormal   Collection Time: 11/13/23  8:48 AM  Result Value Ref Range   Sodium 139 135 - 145 mmol/L   Potassium 3.6 3.5 - 5.1 mmol/L   Chloride 105 98 - 111 mmol/L   CO2 24 22 - 32 mmol/L   Glucose, Bld 88 70 - 99 mg/dL    Comment: Glucose reference range applies only to samples taken after fasting for at least 8 hours.   BUN 17 8 - 23 mg/dL   Creatinine, Ser 1.91 0.44 - 1.00 mg/dL   Calcium 8.6 (L) 8.9 - 10.3 mg/dL   GFR, Estimated >47 >82 mL/min    Comment: (NOTE) Calculated using the CKD-EPI Creatinine Equation (2021)    Anion gap 10 5 - 15    Comment: Performed at [facility], [address]  [place], [place] [address]  CBC per protocol     Status: Abnormal   Collection Time: 11/13/23  8:48 AM  Result Value Ref Range   WBC 10.2 4.0 - 10.5 K/uL   RBC 4.55 3.87 - 5.11 MIL/uL   Hemoglobin 11.0 (L) 12.0 - 15.0 g/dL   HCT 30.8 (  L) 36.0 - 46.0 %   MCV 78.9 (L) 80.0 - 100.0 fL   MCH 24.2 (L) 26.0 - 34.0 pg   MCHC 30.6 30.0 - 36.0 g/dL   RDW 40.1 (H) 02.7 - 25.3 %   Platelets 457 (H) 150 - 400 K/uL   nRBC 0.0 0.0 - 0.2 %    Comment: Performed at [facility], [address], [place], [place] [address]   No results found.  Review of Systems  Constitutional:  Positive for activity change.  Musculoskeletal:  Positive for back pain and gait problem.  Neurological:  Positive for weakness and numbness.  All other systems reviewed and are negative.   Blood pressure 103/84, pulse 76, temperature 97.8 F (36.6 C), temperature source Oral, resp. rate 17, height 5\' 2"  (1.575 m), weight 74.8 kg, last menstrual period 11/26/1983, SpO2 98%. Physical Exam Constitutional:      Appearance: Normal appearance.  HENT:     Head: Normocephalic and atraumatic.     Right Ear: Tympanic membrane, ear canal and external ear normal.     Left Ear: Tympanic membrane, ear canal and external ear normal.     Nose: Nose normal.     Mouth/Throat:     Mouth: Mucous membranes are moist.     Pharynx: Oropharynx is clear.  Eyes:     Conjunctiva/sclera: Conjunctivae normal.     Pupils: Pupils are equal, round, and reactive to light.  Cardiovascular:     Rate and Rhythm: Normal rate and regular rhythm.     Pulses: Normal pulses.     Heart sounds: Normal heart sounds.  Pulmonary:     Effort: Pulmonary effort is normal.     Breath sounds: Normal breath sounds.  Abdominal:     General: Abdomen is flat.     Palpations: Abdomen is soft.  Musculoskeletal:        General: Normal range of motion.  Skin:    General: Skin is warm and dry.     Capillary Refill: Capillary refill takes less than 2 seconds.   Neurological:     General: No focal deficit present.     Mental Status: She is alert and oriented to person, place, and time.  Psychiatric:        Mood and Affect: Mood normal.        Behavior: Behavior normal.        Thought Content: Thought content normal.        Judgment: Judgment normal.      Assessment/Plan Pseudomeningocele lumbar spine.  Plan lumboperitoneal shunt catheter placement  Stefani Dama, MD 11/13/2023, 10:48 AM

## 2023-11-13 NOTE — Anesthesia Postprocedure Evaluation (Signed)
Anesthesia Post Note  Patient: Theresa Holmes  Procedure(s) Performed: Lumbar Peritoneal Shunt     Patient location during evaluation: PACU Anesthesia Type: General Level of consciousness: awake and alert Pain management: pain level controlled Vital Signs Assessment: post-procedure vital signs reviewed and stable Respiratory status: spontaneous breathing, nonlabored ventilation, respiratory function stable and patient connected to nasal cannula oxygen Cardiovascular status: blood pressure returned to baseline and stable Postop Assessment: no apparent nausea or vomiting Anesthetic complications: no  No notable events documented.  Last Vitals:  Vitals:   11/13/23 1500 11/13/23 1546  BP:  123/82  Pulse:  68  Resp:  16  Temp: 36.4 C (!) 36.4 C  SpO2:  96%    Last Pain:  Vitals:   11/13/23 1559  TempSrc:   PainSc: 5                  Shelton Silvas

## 2023-11-13 NOTE — Anesthesia Preprocedure Evaluation (Addendum)
Anesthesia Evaluation  Patient identified by MRN, date of birth, ID band Patient awake    Reviewed: Allergy & Precautions, NPO status , Patient's Chart, lab work & pertinent test results  Airway Mallampati: II  TM Distance: >3 FB Neck ROM: Full    Dental  (+) Teeth Intact, Dental Advisory Given, Caps, Implants   Pulmonary neg pulmonary ROS   breath sounds clear to auscultation       Cardiovascular hypertension,  Rhythm:Regular Rate:Normal     Neuro/Psych  Headaches  negative psych ROS   GI/Hepatic Neg liver ROS, hiatal hernia,GERD  Medicated,,  Endo/Other  Hypothyroidism    Renal/GU negative Renal ROS     Musculoskeletal  (+) Arthritis ,    Abdominal Normal abdominal exam  (+)   Peds  Hematology negative hematology ROS (+)   Anesthesia Other Findings   Reproductive/Obstetrics                              Anesthesia Physical Anesthesia Plan  ASA: 3  Anesthesia Plan: General   Post-op Pain Management: Tylenol PO (pre-op)*   Induction: Intravenous  PONV Risk Score and Plan: 4 or greater and Ondansetron, Dexamethasone and Treatment may vary due to age or medical condition  Airway Management Planned: Oral ETT  Additional Equipment: None  Intra-op Plan:   Post-operative Plan: Extubation in OR  Informed Consent: I have reviewed the patients History and Physical, chart, labs and discussed the procedure including the risks, benefits and alternatives for the proposed anesthesia with the patient or authorized representative who has indicated his/her understanding and acceptance.     Dental advisory given  Plan Discussed with: CRNA  Anesthesia Plan Comments: (See APP note by Joslyn Hy, FNP )         Anesthesia Quick Evaluation

## 2023-11-13 NOTE — Op Note (Signed)
Date of surgery: 11/13/2023 Preoperative diagnosis: Pseudomeningocele of lumbar spine Postoperative diagnosis: Same Procedure: Lumboperitoneal shunt placement Peritoneal access by general surgery Procedure: Placement of lumboperitoneal shunt Surgeon: Barnett Abu Anesthesia: General Tracheal Peritoneal procedure: Feliciana Rossetti, MD  Indications: The patient is an 82 year old individual who had a herniated nucleus pulposus at the L1-L2 level.  She has had recurrent collections of CSF in the lumbar space consistent with a pseudomeningocele which has been under considerable tension.  After careful consideration of her options I advised that we place a lumboperitoneal shunt to divert CSF.  Procedure: Patient was brought to the operating room supine on the stretcher.  After smooth induction of general endotracheal anesthesia she was placed in the right lateral decubitus position.  The left side of the spine and the lumbar and abdominal regions were then prepped with alcohol DuraPrep and draped in a sterile fashion.  Fluoroscopic guidance was used to localize the area of L4 to the sacrum for placement of the lumboperitoneal catheter.  A small vertical incision was made in the lower lumbar spine overlying L4 to the sacrum.  A 14-gauge Touhy needle was then inserted into the CSF space and when CSF was briskly received a catheter was threaded into the subarachnoid space for distance of 25 cm.  Then the needle and the catheter were removed together until the catheter itself could be identified and this was held close to remove the needle.  Subcutaneous tunnel was then passed to an area on the lateral aspect of the abdominal wall just at the subcostal margin.  Here a 3 cm vertical incision was created.  A subcutaneous pouch was then created for placement of the shunt valve.  The subcutaneous shunt passer was then passed between the lumbar and the lateral flank incision and the lumbar catheter was passed to the  midportion of the incision.  Meanwhile Dr. Sheliah Hatch accessed the peritoneal cavity.  The distal catheter was then passed to an access site to the peritoneum and Dr. Sheliah Hatch placed the distal catheter into the peritoneal cavity.  The catheter was not cut.  The shunt valve was then placed in the subcutaneous pocket that had been formed and the lumbar portion was connected to the shunt valve.  A singular 2-0 Vicryl stitch was passed to secure the catheter in place.  Care was taken to make sure that no kinks existed when the system was finally closed.  With this being placed in the subcutaneous pouch the fascia was closed with 3-0 Vicryl in interrupted fashion 3-0 and 4-0 Vicryl was used in the subcuticular skin in both incisions.  Blood loss for the procedure was estimated less than 10 cc patient tolerated procedure well was returned to recovery room in stable condition.

## 2023-11-13 NOTE — H&P (Signed)
**Note Theresa-Identified via Obfuscation** Reason for Consult:spinal leak Referring Provider: Alanson Aly is an 82 y.o. female.  HPI: 83 yo female with long history of spinal leak presents for lumbar peritoneal shunt. She has a history of 2 lap hiatal hernia repairs and abdominoplasty in the past.  Past Medical History:  Diagnosis Date   Arthritis    Atypical chest pain 10/22/2016   Chronic back pain    Exogenous obesity    GERD (gastroesophageal reflux disease)    H/O hiatal hernia    History of kidney stones    Hypercholesterolemia    Hyperlipidemia    Hypertension    12/14/2018 pt. denies   Hypothyroidism    HX PARTIAL THYROIDECTOMY FOR  NODULE- BENIGN   Shingles 11/2012   Spinal stenosis    Thyroid mass    right, benign   Varicose vein     Past Surgical History:  Procedure Laterality Date   ABDOMINAL HYSTERECTOMY  1985   ANTERIOR CERVICAL DECOMP/DISCECTOMY FUSION N/A 06/17/2022   Procedure: ANTERIOR CERVICAL DISCECTOMY AND FUSION CERVICAL FIVE TO SEVEN;  Surgeon: Venita Lick, MD;  Location: [facility];  Service: Orthopedics;  Laterality: N/A;  3.5 hrs 3 C-bed   ANTERIOR LAT LUMBAR FUSION N/A 12/16/2018   Procedure: ANTERIOR LATERAL LUMBAR FUSION L 3-4 with lateral plate;  Surgeon: Venita Lick, MD;  Location: [facility];  Service: Orthopedics;  Laterality: N/A;  3 1/2 hours   APPENDECTOMY     BREAST BIOPSY  9/05   stereotactic   CARPAL TUNNEL RELEASE  12/15   right wrist   CHOLECYSTECTOMY     COMBINED AUGMENTATION MAMMAPLASTY AND ABDOMINOPLASTY  1977   EYE SURGERY  12/05   retinal tear-left   HAMMER TOE SURGERY  2002   HERNIA REPAIR  2004   HIATAL HERNIA REPAIR N/A 01/21/2014   Procedure: LAPAROSCOPIC REPAIR RECURRENT  HIATAL HERNIA REDO NISSEN, upper endoscopy;  Surgeon: Valarie Merino, MD;  Location: [facility];  Service: General;  Laterality: N/A;   KIDNEY STONE SURGERY  2002   LUMBAR LAMINECTOMY/DECOMPRESSION MICRODISCECTOMY N/A 10/07/2023   Procedure: REVISION LUMBAR ONE-TWO LAMINECTOMY,  REAIR CEREBRO SPINAL FLUID LEAK;  Surgeon: Barnett Abu, MD;  Location: [facility];  Service: Neurosurgery;  Laterality: N/A;   NISSEN FUNDOPLICATION     with cholecystectomy   ROTATOR CUFF REPAIR  2001   ROTATOR CUFF REPAIR  1/12    left   ROTATOR CUFF REPAIR  5/13    open   SPINAL FUSION  2005   L4  & L5   THYROIDECTOMY, PARTIAL     TONSILLECTOMY     TONSILLECTOMY AND ADENOIDECTOMY     as a child   UPPER ENDOSCOPY W/ SCLEROTHERAPY  2000   VARICOSE VEIN SURGERY  1977    Family History  Problem Relation Age of Onset   Pulmonary embolism Mother        amniotic fluid embolism   Kidney failure Father        complete shut down   Heart failure Father    Breast cancer Maternal Grandmother 64   Alzheimer's disease Cousin     Social History:  reports that she has never smoked. She has never used smokeless tobacco. She reports current alcohol use of about 1.0 standard drink of alcohol per week. She reports that she does not use drugs.  Allergies: No Known Allergies  Medications: I have reviewed the patient's current medications.  Results for orders placed or performed during the hospital encounter of  11/13/23 (from the past 48 hours)  Basic metabolic panel per protocol     Status: Abnormal   Collection Time: 11/13/23  8:48 AM  Result Value Ref Range   Sodium 139 135 - 145 mmol/L   Potassium 3.6 3.5 - 5.1 mmol/L   Chloride 105 98 - 111 mmol/L   CO2 24 22 - 32 mmol/L   Glucose, Bld 88 70 - 99 mg/dL    Comment: Glucose reference range applies only to samples taken after fasting for at least 8 hours.   BUN 17 8 - 23 mg/dL   Creatinine, Ser 6.96 0.44 - 1.00 mg/dL   Calcium 8.6 (L) 8.9 - 10.3 mg/dL   GFR, Estimated >29 >52 mL/min    Comment: (NOTE) Calculated using the CKD-EPI Creatinine Equation (2021)    Anion gap 10 5 - 15    Comment: Performed at [facility], [address], [place], [place] [address]  CBC per protocol     Status: Abnormal   Collection Time: 11/13/23   8:48 AM  Result Value Ref Range   WBC 10.2 4.0 - 10.5 K/uL   RBC 4.55 3.87 - 5.11 MIL/uL   Hemoglobin 11.0 (L) 12.0 - 15.0 g/dL   HCT 44.0 (L) 10.2 - 72.5 %   MCV 78.9 (L) 80.0 - 100.0 fL   MCH 24.2 (L) 26.0 - 34.0 pg   MCHC 30.6 30.0 - 36.0 g/dL   RDW 36.6 (H) 44.0 - 34.7 %   Platelets 457 (H) 150 - 400 K/uL   nRBC 0.0 0.0 - 0.2 %    Comment: Performed at [facility], [address] [place], [place] [address]    No results found.  Review of Systems  Constitutional: Negative.   HENT: Negative.    Eyes: Negative.   Respiratory: Negative.    Cardiovascular: Negative.   Gastrointestinal: Negative.   Genitourinary: Negative.   Musculoskeletal: Negative.   Skin: Negative.   Neurological: Negative.   Endo/Heme/Allergies: Negative.   Psychiatric/Behavioral: Negative.      PE Blood pressure 103/84, pulse 76, temperature 97.8 F (36.6 C), temperature source Oral, resp. rate 17, height 5\' 2"  (1.575 m), weight 74.8 kg, last menstrual period 11/26/1983, SpO2 98%. Constitutional: NAD; conversant; no deformities Eyes: Moist conjunctiva; no lid lag; anicteric; PERRL Neck: Trachea midline; no thyromegaly Lungs: Normal respiratory effort; no tactile fremitus CV: RRR; no palpable thrills; no pitting edema GI: Abd soft, NT, multiple lap scars; no palpable hepatosplenomegaly MSK: Normal gait; no clubbing/cyanosis Psychiatric: Appropriate affect; alert and oriented x3 Lymphatic: No palpable cervical or axillary lymphadenopathy Skin: No major subcutaneous nodules. Warm and dry   Assessment/Plan: 82 yo female with spinal leak -assist in peritoneal access for lumbar shunt  I reviewed last 24 h vitals and pain scores, last 48 h intake and output, last 24 h labs and trends, and last 24 h imaging results.  This care required high  level of medical decision making.   Theresa Holmes 11/13/2023, 9:33 AM

## 2023-11-13 NOTE — Op Note (Signed)
Preoperative diagnosis: lumbar leak  Postoperative diagnosis: same   Procedure: laparoscopic lumbar peritoneal shunt placement  Surgeon: Feliciana Rossetti, M.D.  Co-Surgeon Barnett Abu, M.D.  Anesthesia: GETA  Indications for procedure: Theresa Holmes is a 82 y.o. year old female with symptoms of lumbar spinal leak after surgery. She presents for shunt placement.  Description of procedure: The patient was brought into the operative suite. Anesthesia was administered with General endotracheal anesthesia. WHO checklist was applied. The patient was then placed in left side up position. The area was prepped and draped in the usual sterile fashion.  Next, a left subcostal incision was made. A 5mm trocar was used to gain access to the peritoneal cavity by optical entry technique. Pneumoperitoneum was applied with a high flow and low pressure. The laparoscope was reinserted to confirm position.  There was a large amount of thin adhesive disease in the left side of the abdomen. Blunt dissection with laparoscope was performed to try to get into a true peritoneal area, intestine was safely identified and I determined that this was the peritoneal cavity and it was scar tissue from previous surgeries. An area in the left upper and left lower abdomen was freed with blunt dissection, the omentum was visualized. The catheter was tunneled to a portion in the left lateral position and a 14 Fr peel away sheath was used to introduce the catheter into the peritoneal cavity. Pneumoperitoneum was removed. The trocar was removed. Incisions were closed with 4-0 monocryl subcuticular suture.  Findings: adhesive disease throughout the abdominal cavity  Specimen: none  Implant: lumbar-peritoneal shunt   Blood loss: 10 ml  Local anesthesia: 5 ml  Complications: none  Feliciana Rossetti, M.D. General, Bariatric, & Minimally Invasive Surgery [facility]

## 2023-11-13 NOTE — Progress Notes (Signed)
Patient ID: Theresa Holmes, female   DOB: Aug 14, 1941, 82 y.o.   MRN: [id number] Vital signs are stable Patient is awake and alert Motor function appears to be intact in the lower extremities She has not mobilize very much yet but is anxious to do so Will see how she does with mobilization and follow-up on pseudomeningocele tomorrow

## 2023-11-13 NOTE — Anesthesia Procedure Notes (Signed)
Procedure Name: Intubation Date/Time: 11/13/2023 12:03 PM  Performed by: Hali Marry, CRNAPre-anesthesia Checklist: Patient identified, Emergency Drugs available, Suction available and Patient being monitored Patient Re-evaluated:Patient Re-evaluated prior to induction Oxygen Delivery Method: Circle system utilized Preoxygenation: Pre-oxygenation with 100% oxygen Induction Type: IV induction Ventilation: Mask ventilation without difficulty Laryngoscope Size: Glidescope and 3 Grade View: Grade I Tube type: Oral Number of attempts: 1 Airway Equipment and Method: Stylet and Oral airway Placement Confirmation: ETT inserted through vocal cords under direct vision, positive ETCO2 and breath sounds checked- equal and bilateral Secured at: 21 cm Tube secured with: Tape Dental Injury: Teeth and Oropharynx as per pre-operative assessment

## 2023-11-13 NOTE — Transfer of Care (Signed)
Immediate Anesthesia Transfer of Care Note  Patient: Theresa Holmes  Procedure(s) Performed: Lumbar Peritoneal Shunt  Patient Location: PACU  Anesthesia Type:General  Level of Consciousness: alert , oriented, and sedated  Airway & Oxygen Therapy: Patient Spontanous Breathing and Patient connected to face mask oxygen  Post-op Assessment: Report given to RN and Post -op Vital signs reviewed and stable  Post vital signs: Reviewed and stable  Last Vitals:  Vitals Value Taken Time  BP 129/61 11/13/23 1330  Temp    Pulse 73 11/13/23 1331  Resp 16 11/13/23 1331  SpO2 82 % 11/13/23 1331  Vitals shown include unfiled device data.  Last Pain:  Vitals:   11/13/23 0855  TempSrc:   PainSc: 0-No pain      Patients Stated Pain Goal: 0 (11/13/23 0855)  Complications: No notable events documented.

## 2023-11-14 ENCOUNTER — Inpatient Hospital Stay (HOSPITAL_COMMUNITY): Payer: No Typology Code available for payment source

## 2023-11-14 MED ORDER — DEXAMETHASONE 4 MG PO TABS
2.0000 mg | ORAL_TABLET | Freq: Once | ORAL | Status: AC
  Administered 2023-11-14: 2 mg via ORAL
  Filled 2023-11-14: qty 1

## 2023-11-14 MED ORDER — OXYCODONE-ACETAMINOPHEN 5-325 MG PO TABS
1.0000 | ORAL_TABLET | Freq: Four times a day (QID) | ORAL | Status: DC | PRN
  Administered 2023-11-14 – 2023-11-15 (×2): 1 via ORAL
  Filled 2023-11-14 (×2): qty 1

## 2023-11-14 MED ORDER — GABAPENTIN 300 MG PO CAPS
600.0000 mg | ORAL_CAPSULE | Freq: Every evening | ORAL | Status: DC | PRN
  Administered 2023-11-14: 600 mg via ORAL
  Filled 2023-11-14: qty 2

## 2023-11-14 MED ORDER — GUAIFENESIN 100 MG/5ML PO LIQD
5.0000 mL | ORAL | Status: DC | PRN
  Filled 2023-11-14: qty 5

## 2023-11-14 NOTE — Evaluation (Signed)
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 PMH includes: revision L1-2 laminectomy and repair of CSF leak 11/12, arthritis, GERD, kidney stones, hypercholesterolemia, HLD, HTN, hypothyroidism.   Clinical Impression  Pt in bed upon arrival of PT, agreeable to evaluation at this time. Prior to admission the pt was mobilizing with a RW in her home, more limited in activity and endurance since last surgery in November. The pt was able to complete bed mobility with verbal cues, did use bed rails for assist with transition to sitting, but no physical assist. The pt was then able to complete sit-stand transfers and hallway ambulation with CGA-minA with use of RW. Pt initially wanting to ambulate without DME, but was reliant on modA through [facility] and reaching for additional furniture to steady. The pt needed max cues for posture and RW positioning with gait, will benefit from continued skilled PT to complete further balance, strengthening, and stair training as well as HHPT to improve mobility and safety in the home after d/c.      If plan is discharge home, recommend the following: Help with stairs or ramp for entrance;Assist for transportation;Assistance with cooking/housework   Can travel by private vehicle        Equipment Recommendations None recommended by PT  Recommendations for Other Services       Functional Status Assessment Patient has had a recent decline in their functional status and demonstrates the ability to make significant improvements in function in a reasonable and predictable amount of time.     Precautions / Restrictions Precautions Precautions: Back;Fall Precaution Comments: no brace  needed Restrictions Weight Bearing Restrictions Per Provider Order: No      Mobility  Bed Mobility Overal bed mobility: Needs Assistance Bed Mobility: Rolling, Sidelying to Sit Rolling: Contact guard assist Sidelying to sit: Supervision, HOB elevated, Used rails       General bed mobility comments: CGA and cues for log roll, HOB slightly elevated for sidelying to sit    Transfers Overall transfer level: Needs assistance Equipment used: Rolling walker (2 wheels) Transfers: Sit to/from Stand Sit to Stand: Contact guard assist           General transfer comment: VC's for hand palcement on seated surface for safety. No assist required for power up to full stand.    Ambulation/Gait Ambulation/Gait assistance: Contact guard assist, Min assist Gait Distance (Feet): 250 Feet Assistive device: Rolling walker (2 wheels) Gait Pattern/deviations: Step-through pattern, Decreased stride length, Trunk flexed Gait velocity: decreased Gait velocity interpretation: <1.31 ft/sec, indicative of household ambulator   General Gait Details: VC's for improved posture, closer walker proximity and forward gaze. Slow but generally steady with the RW for support. letting go every once in a while ot gesture with hands while talking, RW tipping and minA given to steady     Balance Overall balance assessment: Needs assistance Sitting-balance support: Feet supported Sitting balance-Leahy Scale: Good     Standing balance support: No upper extremity supported, During functional activity Standing balance-Leahy Scale: Poor Standing balance comment: at least single UE support for gait                             Pertinent Vitals/Pain  Pain Assessment Pain Assessment: 0-10 Pain Score: 5  Faces Pain Scale: Hurts little more Pain Location: back with intermittent sharp pain down L leg Pain Descriptors / Indicators: Discomfort, Grimacing, Operative site guarding Pain Intervention(s):  Limited activity within patient's tolerance, Monitored during session, Repositioned    Home Living Family/patient expects to be discharged to:: Private residence Living Arrangements: Spouse/significant other Available Help at Discharge: Family;Available 24 hours/day Type of Home: House Home Access: Stairs to enter Entrance Stairs-Rails: Doctor, general practice of Steps: 3   Home Layout: Two level;Able to live on main level with bedroom/bathroom Home Equipment: Rolling Walker (2 wheels);Shower seat;Grab bars - toilet;Grab bars - tub/shower;Toilet riser;Cane - single point;Rollator (4 wheels);Lift chair Additional Comments: spouse is orthopedic surgeon, still working 3 days/week. she is a retired Engineer, building services Prior Level of Function : Independent/Modified Independent             Mobility Comments: RW in the home for last 2 months ADLs Comments: previously independent, more difficult after last surgery.     Extremity/Trunk Assessment   Upper Extremity Assessment Upper Extremity Assessment: Defer to OT evaluation    Lower Extremity Assessment Lower Extremity Assessment: Generalized weakness (no focal deficits or numbness, grossly 4/5 to MMT)    Cervical / Trunk Assessment Cervical / Trunk Assessment: Back Surgery  Communication   Communication Communication: No apparent difficulties Cueing Techniques: Verbal cues  Cognition Arousal: Alert Behavior During Therapy: WFL for tasks assessed/performed Overall Cognitive Status: No family/caregiver present to determine baseline cognitive functioning                                 General Comments: no family present to confirm, pt needing repeated cues to maintiain RW proximity, max cues for safety, and difficult time explaining entry stair railings.        General Comments General comments (skin integrity, edema, etc.): VSS on RA        Assessment/Plan    PT Assessment Patient needs  continued PT services  PT Problem List Decreased strength;Decreased balance;Decreased mobility;Decreased knowledge of precautions       PT Treatment Interventions DME instruction;Gait training;Stair training;Functional mobility training;Therapeutic activities;Therapeutic exercise;Balance training;Patient/family education    PT Goals (Current goals can be found in the Care Plan section)  Acute Rehab PT Goals Patient Stated Goal: to reduce pain, return home, stop having surgeries PT Goal Formulation: With patient Time For Goal Achievement: 11/28/23 Potential to Achieve Goals: Good    Frequency Min 1X/week        AM-PAC PT "6 Clicks" Mobility  Outcome Measure Help needed turning from your back to your side while in a flat bed without using bedrails?: A Little Help needed moving from lying on your back to sitting on the side of a flat bed without using bedrails?: A Little Help needed moving to and from a bed to a chair (including a wheelchair)?: A Little Help needed standing up from a chair using your arms (e.g., wheelchair or bedside chair)?: A Little Help needed to walk in hospital room?: A Little Help needed climbing 3-5 steps with a railing? : A Little 6 Click Score: 18    End of Session Equipment Utilized During Treatment: Gait belt Activity Tolerance: Patient tolerated treatment well Patient left: with call bell/phone within reach;in chair Nurse Communication: Mobility status PT Visit Diagnosis: Other abnormalities of gait and mobility (R26.89);Pain Pain - Right/Left: Left Pain -  part of body: Leg    Time: 0865-7846 PT Time Calculation (min) (ACUTE ONLY): 30 min   Charges:   PT Evaluation $PT Eval Moderate Complexity: 1 Mod PT Treatments $Gait Training: 8-22 mins PT General Charges $$ ACUTE PT VISIT: 1 Visit         Vickki Muff, PT, DPT   Acute Rehabilitation Department Office [phone] Secure Chat Communication Preferred  Ronnie Derby 11/14/2023,  10:29 AM

## 2023-11-14 NOTE — Progress Notes (Signed)
Patient ID: Benjiman Core, female   DOB: 1941-11-20, 82 y.o.   MRN: [id number] Vital signs are stable Patient is somewhat confused after Percocet today When lying down she complains of pain proximal thighs on both sides Incisions are clean and dry however the pseudomeningocele still evidently present.  Today with a sterile Betadine prep on her back I inserted an 18-gauge needle into the subcutaneous fluid space and drew off 40 cc of straw-colored fluid.  This caused a great retraction in the bed of her incision and I then placed a gauze fluff pressure dressing over this area and taped it in place.  I will obtain an AP of the abdomen with a lateral to check tube placement for the recently placed shunt catheters.  Will continue to follow along I have decreased her Percocet to 1 every 6 hours and the hopeful that this will help control some of the confusional state.  I spoke with Dr. Bobby Rumpf regarding my findings.

## 2023-11-14 NOTE — Care Management (Addendum)
Transition of Care [facility]) - Inpatient Brief Assessment   Patient Details  Name: Theresa Holmes MRN: [id number] Date of Birth: 1941/09/10  [facility]) CM/SW Contact:    Lockie Pares, RN Phone Number: 11/14/2023, 11:59 AM   Clinical Narrative:  83 year old presented for LP shunt due to spinal fluid leakage. PT recommending Home health. Patient has [employer]. Insurance. Called patient to discuss due to remote, left a message for return call. 1500 Called and spoke to [place], Husband of patient.  He would be good with Home health PT. Does not think she needs OT Wants a HH that works best with insurance, Artist called and accepted for [facility], Kandee Keen from [place] aware to call husband for scheduling, TOC will follow for needs  Transition of Care Asessment: Insurance and Status: Insurance coverage has been reviewed Patient has primary care physician: Yes Home environment has been reviewed: Lives with spouse Prior level of function:: Walks with walker Prior/Current Home Services: No current home services Social Drivers of Health Review: SDOH reviewed no interventions necessary Readmission risk has been reviewed: Yes Transition of care needs: transition of care needs identified, TOC will continue to follow

## 2023-11-14 NOTE — TOC Progression Note (Addendum)
Transition of Care [facility]) - Progression Note    Patient Details  Name: Theresa Holmes MRN: [id number] Date of Birth: [birth year]  Transition of Care [facility]) CM/SW Contact  Nicanor Bake Phone Number: [phone] 11/14/2023, 1:49 PM  Clinical Narrative:   1:35 PM- CSW attempted to meet with pt at bedside. Pt was being seen staff (appeared to be a doctor). CSW will follow up at amore appropriate time.   TOC will continue following.          Expected Discharge Plan and Services                                               Social Determinants of Health (SDOH) Interventions SDOH Screenings   Depression (PHQ2-9): Low Risk  (06/14/2021)  Tobacco Use: Low Risk  (11/13/2023)    Readmission Risk Interventions     No data to display

## 2023-11-14 NOTE — Evaluation (Signed)
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 PMH includes: revision L1-2 laminectomy and repair of CSF leak 11/12, arthritis, GERD, kidney stones, hypercholesterolemia, HLD, HTN, hypothyroidism.   Clinical Impression   Patient admitted for the diagnosis and procedure above.  Currently she is needing Mod A for lower body ADL and CGA to supervision for generalized mobility at RW level.  Patient is struggling with problem solving and safety post op, and would need 24 hour oversight/assist as needed at home initially until she returns to her baseline.  OT will continue efforts in the acute setting if she does not discharge today.  Final ADL prior to discharge would be advised, so compensatory techniques/hip kit could be demonstrated.  Pain to her lower extremities is the primary deficit.  No post acute OT is anticipated.         If plan is discharge home, recommend the following: A little help with walking and/or transfers;A lot of help with bathing/dressing/bathroom;Assistance with cooking/housework;Direct supervision/assist for medications management;Direct supervision/assist for financial management;Help with stairs or ramp for entrance;Assist for transportation    Functional Status Assessment  Patient has had a recent decline in their functional status and demonstrates the ability to make significant improvements in function in a reasonable and predictable amount of time.  Equipment Recommendations  None recommended by OT    Recommendations for Other Services       Precautions / Restrictions Precautions Precautions: Back;Fall Precaution Booklet Issued: Yes (comment) Precaution Comments: no brace  needed Restrictions Weight Bearing Restrictions Per Provider Order: No      Mobility Bed Mobility Overal bed mobility: Needs Assistance Bed Mobility: Sit to Sidelying         Sit to sidelying: Min assist      Transfers Overall transfer level: Needs assistance Equipment used: Rolling walker (2 wheels) Transfers: Sit to/from Stand Sit to Stand: Supervision, Contact guard assist           General transfer comment: Cues for RW management.  Tends to walked hunched over with the RW too far in front.      Balance Overall balance assessment: Needs assistance Sitting-balance support: Feet supported Sitting balance-Leahy Scale: Good     Standing balance support: No upper extremity supported, During functional activity Standing balance-Leahy Scale: Poor                             ADL either performed or assessed with clinical judgement   ADL       Grooming: Supervision/safety;Standing;Cueing for compensatory techniques               Lower Body Dressing: Moderate assistance;Sit to/from stand;Minimal assistance   Toilet Transfer: Contact guard assist;Ambulation;Rolling walker (2 wheels);Supervision/safety                   Vision Baseline Vision/History: 1 Wears glasses Patient Visual Report: No change from baseline       Perception Perception: Not tested       Praxis Praxis: Not tested       Pertinent Vitals/Pain Pain Assessment Pain Assessment: Faces Faces Pain Scale: Hurts even more Pain Location: back with intermittent sharp pain down L leg Pain Descriptors / Indicators:  Discomfort, Grimacing, Operative site guarding Pain Intervention(s): Monitored during session     Extremity/Trunk Assessment Upper Extremity Assessment Upper Extremity Assessment: Overall WFL for tasks assessed   Lower Extremity Assessment Lower Extremity Assessment: Defer to PT evaluation   Cervical / Trunk Assessment Cervical / Trunk Assessment: Back  Surgery   Communication Communication Communication: No apparent difficulties Cueing Techniques: Verbal cues   Cognition Arousal: Alert Behavior During Therapy: WFL for tasks assessed/performed Overall Cognitive Status: Impaired/Different from baseline Area of Impairment: Memory, Problem solving, Safety/judgement                     Memory: Decreased short-term memory, Decreased recall of precautions   Safety/Judgement: Decreased awareness of safety   Problem Solving: Slow processing, Requires verbal cues       General Comments  VSS on RA    Exercises     Shoulder Instructions      Home Living Family/patient expects to be discharged to:: Private residence Living Arrangements: Spouse/significant other Available Help at Discharge: Family;Available 24 hours/day Type of Home: House Home Access: Stairs to enter [employer] of Steps: 3 Entrance Stairs-Rails: Right;Left Home Layout: Two level;Able to live on main level with bedroom/bathroom     Bathroom Shower/Tub: Arts development officer Toilet: Handicapped height     Home Equipment: Agricultural consultant (2 wheels);Shower seat;Grab bars - toilet;Grab bars - tub/shower;Toilet riser;Cane - single point;Rollator (4 wheels);Lift chair   Additional Comments: spouse is orthopedic surgeon, still working 3 days/week. she is a retired Community education officer Prior Level of Function : Independent/Modified Independent             Mobility Comments: RW in the home for last 2 months ADLs Comments: previously independent, more difficult after last surgery.        OT Problem List: Decreased activity tolerance;Impaired balance (sitting and/or standing);Decreased knowledge of precautions;Decreased knowledge of use of DME or AE;Decreased safety awareness;Pain;Decreased strength      OT Treatment/Interventions: Self-care/ADL training;Therapeutic exercise;DME and/or AE instruction;Therapeutic  activities;Balance training;Patient/family education    OT Goals(Current goals can be found in the care plan section) Acute Rehab OT Goals Patient Stated Goal: ? home today OT Goal Formulation: With patient Time For Goal Achievement: 11/28/23 Potential to Achieve Goals: Good ADL Goals Pt Will Perform Grooming: with modified independence;standing Pt Will Perform Lower Body Bathing: with modified independence;sit to/from stand Pt Will Transfer to Toilet: with modified independence;ambulating;regular height toilet  OT Frequency: Min 1X/week    Co-evaluation              AM-PAC OT "6 Clicks" Daily Activity     Outcome Measure Help from another person eating meals?: None Help from another person taking care of personal grooming?: A Little Help from another person toileting, which includes using toliet, bedpan, or urinal?: A Little Help from another person bathing (including washing, rinsing, drying)?: A Lot Help from another person to put on and taking off regular upper body clothing?: None Help from another person to put on and taking off regular lower body clothing?: A Lot 6 Click Score: 18   End of Session Equipment Utilized During Treatment: Rolling walker (2 wheels) Nurse Communication: Mobility status  Activity Tolerance: Patient tolerated treatment well Patient left: in bed;with call bell/phone within reach;with nursing/sitter in room  OT Visit Diagnosis: Unsteadiness on feet (R26.81);Other abnormalities of gait and mobility (R26.89);Muscle weakness (generalized) (M62.81);Pain Pain - Right/Left: Right Pain - part of body: Leg  Time: 1030-1059 OT Time Calculation (min): 29 min Charges:  OT General Charges $OT Visit: 1 Visit OT Evaluation $OT Eval Moderate Complexity: 1 Mod OT Treatments $Self Care/Home Management : 8-22 mins  11/14/2023  RP, OTR/L  Acute Rehabilitation Services  Office:  [phone]   Suzanna Obey 11/14/2023, 11:05 AM

## 2023-11-15 NOTE — Progress Notes (Signed)
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 PMH includes: revision L1-2 laminectomy and repair of CSF leak 11/12, arthritis, GERD, kidney stones, hypercholesterolemia, HLD, HTN, hypothyroidism.   OT comments  Pt progressing toward established OT goals. Performing LB ADL with AE and min A/cues for technique within precautions today. Husband present and supportive and feels confident cueing the pt. Will continue to follow acutely but do not suspect need for follow up OT after discharge.        If plan is discharge home, recommend the following:  A little help with walking and/or transfers;A lot of help with bathing/dressing/bathroom;Assistance with cooking/housework;Direct supervision/assist for medications management;Direct supervision/assist for financial management;Help with stairs or ramp for entrance;Assist for transportation   Equipment Recommendations  None recommended by OT    Recommendations for Other Services      Precautions / Restrictions Precautions Precautions: Back;Fall Precaution Booklet Issued: Yes (comment) Precaution Comments: no brace needed Restrictions Weight Bearing Restrictions Per Provider Order: No       Mobility Bed Mobility Overal bed mobility: Needs Assistance Bed Mobility: Rolling, Sidelying to Sit Rolling: Supervision Sidelying to sit: Supervision       General bed mobility comments: HOB elevated. VC's for log roll technique.    Transfers Overall transfer level: Needs assistance Equipment used: Rolling walker (2 wheels) Transfers: Sit to/from Stand Sit to Stand: Supervision, Contact guard assist           General transfer comment: VC's for hand  placement on seated surface for safety. No assist required but close guard provided for safety and tactile cues for posture.     Balance Overall balance assessment: Needs assistance Sitting-balance support: Feet supported Sitting balance-Leahy Scale: Good     Standing balance support: No upper extremity supported, During functional activity Standing balance-Leahy Scale: Poor Standing balance comment: at least single UE support for gait                           ADL either performed or assessed with clinical judgement   ADL Overall ADL's : Needs assistance/impaired                 Upper Body Dressing : Set up;Sitting   Lower Body Dressing: Minimal assistance;Sit to/from stand Lower Body Dressing Details (indicate cue type and reason): cues for appriate techniques                    Extremity/Trunk Assessment Upper Extremity Assessment Upper Extremity Assessment: Overall WFL for tasks assessed   Lower Extremity Assessment Lower Extremity Assessment: Defer to PT evaluation        Vision   Vision Assessment?: No apparent visual deficits   Perception Perception Perception: Not tested   Praxis Praxis Praxis: Not tested    Cognition Arousal: Alert Behavior During Therapy: WFL for tasks assessed/performed Overall Cognitive Status: Impaired/Different from baseline Area of Impairment: Memory, Problem solving, Safety/judgement                     Memory: Decreased short-term memory, Decreased recall of precautions   Safety/Judgement: Decreased awareness of safety   Problem Solving: Slow processing, Requires verbal cues General Comments: slowed speed of processing and  command following needing additional cueing for new learning        Exercises      Shoulder Instructions       General Comments VSS on RA    Pertinent Vitals/ Pain       Pain Assessment Pain Assessment: Faces Faces Pain Scale: Hurts even more Pain Location:  Back/incision site Pain Descriptors / Indicators: Discomfort, Grimacing, Operative site guarding, Sore Pain Intervention(s): Limited activity within patient's tolerance, Monitored during session  Home Living                                          Prior Functioning/Environment              Frequency  Min 1X/week        Progress Toward Goals  OT Goals(current goals can now be found in the care plan section)  Progress towards OT goals: Progressing toward goals  Acute Rehab OT Goals Patient Stated Goal: home OT Goal Formulation: With patient Time For Goal Achievement: 11/28/23 Potential to Achieve Goals: Good ADL Goals Pt Will Perform Grooming: with modified independence;standing Pt Will Perform Lower Body Bathing: with modified independence;sit to/from stand Pt Will Transfer to Toilet: with modified independence;ambulating;regular height toilet  Plan      Co-evaluation                 AM-PAC OT "6 Clicks" Daily Activity     Outcome Measure   Help from another person eating meals?: None Help from another person taking care of personal grooming?: A Little Help from another person toileting, which includes using toliet, bedpan, or urinal?: A Little Help from another person bathing (including washing, rinsing, drying)?: A Lot Help from another person to put on and taking off regular upper body clothing?: None Help from another person to put on and taking off regular lower body clothing?: A Little 6 Click Score: 19    End of Session Equipment Utilized During Treatment: Rolling walker (2 wheels)  OT Visit Diagnosis: Unsteadiness on feet (R26.81);Other abnormalities of gait and mobility (R26.89);Muscle weakness (generalized) (M62.81);Pain Pain - Right/Left: Right Pain - part of body: Leg   Activity Tolerance Patient tolerated treatment well   Patient Left in chair;with call bell/phone within reach;with family/visitor present   Nurse  Communication Mobility status        Time: 1610-9604 OT Time Calculation (min): 33 min  Charges: OT General Charges $OT Visit: 1 Visit OT Treatments $Self Care/Home Management : 23-37 mins  Tyler Deis, OTR/L [facility] Acute Rehabilitation Office: [phone]   Theresa Holmes 11/15/2023, 9:24 AM

## 2023-11-15 NOTE — Discharge Summary (Signed)
Physician Discharge Summary  Patient ID: Theresa Holmes MRN: [id number] DOB/AGE: 1941-04-03 82 y.o.  Admit date: 11/13/2023 Discharge date: 11/15/2023  Admission Diagnoses: Lumbar radiculopathy, pseudomeningocele lumbar spine.  Discharge Diagnoses: Lumbar radiculopathy, pseudomeningocele lumbar spine. Principal Problem:   Pseudomeningocele, acquired   Discharged Condition: fair  Hospital Course: Patient was admitted to undergo placement of a lumboperitoneal shunt which she tolerated well.  She is ambulatory but minimally she will go home with physical therapy and Occupational Therapy to see her as an outpatient.  Consults: None  Significant Diagnostic Studies: None  Treatments: surgery: See op note  Discharge Exam: Blood pressure (!) 103/51, pulse 69, temperature 98 F (36.7 C), temperature source Oral, resp. rate 19, height 5\' 2"  (1.575 m), weight 74.8 kg, last menstrual period 11/26/1983, SpO2 96%. Incision is clean and dry motor function is intact.  Disposition: Discharge disposition: 01-Home or Self Care       Discharge Instructions     Call MD for:  redness, tenderness, or signs of infection (pain, swelling, redness, odor or green/yellow discharge around incision site)   Complete by: As directed    Call MD for:  severe uncontrolled pain   Complete by: As directed    Call MD for:  temperature >100.4   Complete by: As directed    Diet - low sodium heart healthy   Complete by: As directed    Incentive spirometry RT   Complete by: As directed    Increase activity slowly   Complete by: As directed       Allergies as of 11/15/2023   No Known Allergies      Medication List     TAKE these medications    atorvastatin 10 MG tablet Commonly known as: LIPITOR Take 10 mg by mouth 2 (two) times a week. Takes on Wednesday and Sunday   cyclobenzaprine 10 MG tablet Commonly known as: FLEXERIL Take 10 mg by mouth 3 (three) times daily as needed for muscle  spasms.   dexamethasone 1 MG tablet Commonly known as: DECADRON 2 tablets twice daily for 3 days, one tablet twice daily for 3 days, one tablet daily for 3 days.   diazepam 5 MG tablet Commonly known as: VALIUM Take 5 mg by mouth every 6 (six) hours as needed for muscle spasms.   donepezil 10 MG tablet Commonly known as: ARICEPT Take 10 mg by mouth at bedtime.   doxycycline 100 MG capsule Commonly known as: VIBRAMYCIN Take 100 mg by mouth 2 (two) times daily.   gabapentin 300 MG capsule Commonly known as: NEURONTIN Take 600 mg by mouth at bedtime as needed (For pain).   Gemtesa 75 MG Tabs Generic drug: Vibegron Take 75 mg by mouth daily.   levothyroxine 125 MCG tablet Commonly known as: SYNTHROID Take 125 mcg by mouth daily before breakfast.   multivitamin with minerals tablet Take 1 tablet by mouth daily.   omeprazole 40 MG capsule Commonly known as: PRILOSEC Take 40 mg by mouth daily.   oxyCODONE-acetaminophen 5-325 MG tablet Commonly known as: PERCOCET/ROXICET Take 1-2 tablets by mouth every 4 (four) hours as needed for severe pain (pain score 7-10).   temazepam 15 MG capsule Commonly known as: RESTORIL Take 15 mg by mouth at bedtime as needed for sleep (every other).        Follow-up Information     [facility] Follow up.   Specialty: Home Health Services Why: They willl call you to set up services. 24-48 hours after  discharge. Contact information: [address] [place] [address] [phone]                 Signed: Stefani Dama 11/15/2023, 9:56 AM

## 2023-11-15 NOTE — Plan of Care (Signed)
  Problem: Pain Management: Goal: General experience of comfort will improve Outcome: Completed/Met   Problem: Skin Integrity: Goal: Risk for impaired skin integrity will decrease Outcome: Completed/Met   Problem: Education: Goal: Ability to verbalize activity precautions or restrictions will improve Outcome: Completed/Met Goal: Knowledge of the prescribed therapeutic regimen will improve Outcome: Completed/Met   Problem: Pain Management: Goal: Pain level will decrease Outcome: Completed/Met   Problem: Skin Integrity: Goal: Will show signs of wound healing Outcome: Completed/Met   Problem: Bladder/Genitourinary: Goal: Urinary functional status for postoperative course will improve Outcome: Completed/Met

## 2023-11-15 NOTE — Progress Notes (Signed)
Physical Therapy Treatment  Patient Details Name: Theresa Holmes MRN: [id number] DOB: 04-01-1941 Today's Date: 11/15/2023   History of Present Illness The pt is an 82 yo female presenting 12/19 with  recurrent collections of CSF in the lumbar space consistent with a pseudomeningocele, now s/p Lumboperitoneal shunt placement 12/19. PMH includes: revision L1-2 laminectomy and repair of CSF leak 11/12, arthritis, GERD, kidney stones, hypercholesterolemia, HLD, HTN, hypothyroidism.    PT Comments  Pt progressing towards physical therapy goals. Was able to perform transfers and ambulation with gross CGA and RW for support. Pt requires frequent multimodal cues for maintenance of precautions and upright posture. Reinforced education on precautions. Husband present at bedside and supportive throughout. Will continue to follow and progress as able per POC.     If plan is discharge home, recommend the following: Help with stairs or ramp for entrance;Assist for transportation;Assistance with cooking/housework;A little help with walking and/or transfers;A little help with bathing/dressing/bathroom   Can travel by private vehicle        Equipment Recommendations  None recommended by PT    Recommendations for Other Services       Precautions / Restrictions Precautions Precautions: Back;Fall Precaution Booklet Issued: Yes (comment) Precaution Comments: no brace needed Restrictions Weight Bearing Restrictions Per Provider Order: No     Mobility  Bed Mobility Overal bed mobility: Needs Assistance Bed Mobility: Rolling, Sidelying to Sit Rolling: Supervision Sidelying to sit: Supervision       General bed mobility comments: HOB elevated. VC's for log roll technique.    Transfers Overall transfer level: Needs assistance Equipment used: Rolling walker (2 wheels) Transfers: Sit to/from Stand Sit to Stand: Supervision, Contact guard assist           General transfer comment: VC's for  hand placement on seated surface for safety. No assist required but close guard provided for safety and tactile cues for posture.    Ambulation/Gait Ambulation/Gait assistance: Contact guard assist Gait Distance (Feet): 250 Feet Assistive device: Rolling walker (2 wheels) Gait Pattern/deviations: Step-through pattern, Decreased stride length, Trunk flexed Gait velocity: Decreased Gait velocity interpretation: 1.31 - 2.62 ft/sec, indicative of limited community ambulator   General Gait Details: VC's for improved posture, closer walker proximity and forward gaze. Slow but generally steady with the RW for support. Requires increased cues for maintenance of precautions.   Stairs             Wheelchair Mobility     Tilt Bed    Modified Rankin (Stroke Patients Only)       Balance Overall balance assessment: Needs assistance Sitting-balance support: Feet supported Sitting balance-Leahy Scale: Good     Standing balance support: No upper extremity supported, During functional activity Standing balance-Leahy Scale: Poor Standing balance comment: at least single UE support for gait                            Cognition Arousal: Alert Behavior During Therapy: WFL for tasks assessed/performed Overall Cognitive Status: Impaired/Different from baseline Area of Impairment: Memory, Problem solving, Safety/judgement                     Memory: Decreased short-term memory, Decreased recall of precautions   Safety/Judgement: Decreased awareness of safety   Problem Solving: Slow processing, Requires verbal cues General Comments: Improved from initial evaluation but not yet back to baseline.        Exercises      General  Comments        Pertinent Vitals/Pain Pain Assessment Pain Assessment: Faces Faces Pain Scale: Hurts even more Pain Location: Back/incision site Pain Descriptors / Indicators: Discomfort, Grimacing, Operative site guarding,  Sore Pain Intervention(s): Limited activity within patient's tolerance, Monitored during session, Repositioned, Ice applied    Home Living                          Prior Function            PT Goals (current goals can now be found in the care plan section) Acute Rehab PT Goals Patient Stated Goal: to reduce pain, return home, stop having surgeries PT Goal Formulation: With patient Time For Goal Achievement: 11/28/23 Potential to Achieve Goals: Good Progress towards PT goals: Progressing toward goals    Frequency    Min 1X/week      PT Plan      Co-evaluation              AM-PAC PT "6 Clicks" Mobility   Outcome Measure  Help needed turning from your back to your side while in a flat bed without using bedrails?: A Little Help needed moving from lying on your back to sitting on the side of a flat bed without using bedrails?: A Little Help needed moving to and from a bed to a chair (including a wheelchair)?: A Little Help needed standing up from a chair using your arms (e.g., wheelchair or bedside chair)?: A Little Help needed to walk in hospital room?: A Little Help needed climbing 3-5 steps with a railing? : A Little 6 Click Score: 18    End of Session Equipment Utilized During Treatment: Gait belt Activity Tolerance: Patient tolerated treatment well Patient left: with call bell/phone within reach;in chair Nurse Communication: Mobility status PT Visit Diagnosis: Other abnormalities of gait and mobility (R26.89);Pain Pain - Right/Left: Left Pain - part of body: Leg     Time: 0751-0821 PT Time Calculation (min) (ACUTE ONLY): 30 min  Charges:    $Gait Training: 23-37 mins PT General Charges $$ ACUTE PT VISIT: 1 Visit                     Conni Slipper, PT, DPT Acute Rehabilitation Services Secure Chat Preferred Office: [phone]    [name] 11/15/2023, 8:39 AM

## 2023-11-15 NOTE — Progress Notes (Signed)
Patient alert and oriented, void, ambulate. Surgical site clean and dry no sign of infection, d/c instructions explain and given all questions answered.

## 2023-11-15 NOTE — TOC Transition Note (Signed)
Transition of Care [facility]) - Discharge Note   Patient Details  Name: Theresa Holmes MRN: [id number] Date of Birth: 02/02/1941  [facility] CM/SW Contact:  Lawerance Sabal, RN Phone Number: 11/15/2023, 10:05 AM   Clinical Narrative:     Patient with order to DC to home today. Unit staff to provide DME needed for home.  Per chart review patient referred to [facility], they have been notified of DC today.  Patient will have family/ friends provide transportation home. No other TOC needs identified for DC  Final next level of care: Home w Home Health Services Barriers to Discharge: No Barriers Identified   Patient Goals and CMS Choice Patient states their goals for this hospitalization and ongoing recovery are:: to go home          Discharge Placement                       Discharge Plan and Services Additional resources added to the After Visit Summary for                              [facility] Agency: [facility] Date [facility] Agency Contacted: 11/15/23 Time HH Agency Contacted: 1004 Representative spoke with at [facility] Agency: Kandee Keen  Social Drivers of Health (SDOH) Interventions SDOH Screenings   Depression (PHQ2-9): Low Risk  (06/14/2021)  Tobacco Use: Low Risk  (11/13/2023)     Readmission Risk Interventions     No data to display

## 2023-11-17 ENCOUNTER — Encounter (HOSPITAL_COMMUNITY): Payer: Self-pay | Admitting: Neurological Surgery

## 2024-07-21 ENCOUNTER — Other Ambulatory Visit: Payer: Self-pay

## 2024-07-21 DIAGNOSIS — D509 Iron deficiency anemia, unspecified: Secondary | ICD-10-CM

## 2024-07-21 MED ORDER — NA SULFATE-K SULFATE-MG SULF 17.5-3.13-1.6 GM/177ML PO SOLN: ORAL | 0 refills | Status: DC

## 2024-07-22 ENCOUNTER — Telehealth: Payer: Self-pay

## 2024-07-22 ENCOUNTER — Encounter: Payer: Self-pay | Admitting: Internal Medicine

## 2024-07-22 NOTE — Telephone Encounter (Signed)
 Pt scheduled for ECL for IDA per Dr Abran 07/29/24@7am . Pts husband aware of appt. Prep script sent to pharmacy. Instructions emailed to pts husband and he is aware. Called Dr Rosalene office and requested OV note and labs.amb ref in epic.

## 2024-07-22 NOTE — Telephone Encounter (Signed)
 Thanks

## 2024-07-22 NOTE — Telephone Encounter (Signed)
-----   Message from [facility] [place] [facility] sent at 07/22/2024  2:10 PM EDT ----- Regarding: RE: OV note with Dr Nichole and labs from 8/27 I'll watch out for them ----- Message ----- From: Perley Rock SAUNDERS, RN Sent: 07/22/2024   2:09 PM EDT To: Sonny MARLA Seltzer, CMA Subject: OV note with Dr Nichole and labs from 8/27       I called for these and Abran wants them in his inbox. Can you follow-up and make sure they get there for me. I called and left message on Dr Seabron CMA's voicemail today.  Thanks, [employer]

## 2024-07-23 NOTE — Telephone Encounter (Signed)
 Records and labs put in Dr. Nancyann office

## 2024-07-29 ENCOUNTER — Ambulatory Visit (AMBULATORY_SURGERY_CENTER): Admitting: Internal Medicine

## 2024-07-29 ENCOUNTER — Encounter: Payer: Self-pay | Admitting: Internal Medicine

## 2024-07-29 VITALS — BP 79/55 | HR 106 | Temp 97.7°F | Resp 18 | Ht 59.5 in | Wt 141.8 lb

## 2024-07-29 DIAGNOSIS — K5520 Angiodysplasia of colon without hemorrhage: Secondary | ICD-10-CM | POA: Diagnosis present

## 2024-07-29 DIAGNOSIS — K317 Polyp of stomach and duodenum: Secondary | ICD-10-CM

## 2024-07-29 DIAGNOSIS — K2289 Other specified disease of esophagus: Secondary | ICD-10-CM | POA: Diagnosis not present

## 2024-07-29 DIAGNOSIS — D509 Iron deficiency anemia, unspecified: Secondary | ICD-10-CM

## 2024-07-29 DIAGNOSIS — K5730 Diverticulosis of large intestine without perforation or abscess without bleeding: Secondary | ICD-10-CM | POA: Diagnosis not present

## 2024-07-29 DIAGNOSIS — K209 Esophagitis, unspecified without bleeding: Secondary | ICD-10-CM

## 2024-07-29 DIAGNOSIS — K259 Gastric ulcer, unspecified as acute or chronic, without hemorrhage or perforation: Secondary | ICD-10-CM

## 2024-07-29 DIAGNOSIS — K56609 Unspecified intestinal obstruction, unspecified as to partial versus complete obstruction: Secondary | ICD-10-CM | POA: Diagnosis not present

## 2024-07-29 DIAGNOSIS — K449 Diaphragmatic hernia without obstruction or gangrene: Secondary | ICD-10-CM

## 2024-07-29 MED ORDER — FLEET ENEMA RE ENEM
1.0000 | ENEMA | Freq: Once | RECTAL | Status: AC
Start: 2024-07-29 — End: 2024-07-29
  Administered 2024-07-29: 1 via RECTAL

## 2024-07-29 MED ORDER — SODIUM CHLORIDE 0.9 % IV SOLN: 500.0000 mL | INTRAVENOUS | Status: DC

## 2024-07-29 NOTE — Progress Notes (Signed)
 Stable AF during remainder of procedure, to recovery, A/O x3, comfortable, VSS, remains in AF

## 2024-07-29 NOTE — Progress Notes (Signed)
 NSR converts to Afib, hypotensive, fluid bolus giver, trendelenberg position, Dr. Abran aware

## 2024-07-29 NOTE — Patient Instructions (Addendum)
 -Handout on diverticulosis and hiatal hernia provided -Resume previous diet -Obtain iron supplement OTC. Take 1 daily.  -Please see colonoscopy report regarding atrial fibrillation.  OU HAD AN ENDOSCOPIC PROCEDURE TODAY AT THE [facility]:   Refer to the procedure report that was given to you for any specific questions about what was found during the examination.  If the procedure report does not answer your questions, please call your gastroenterologist to clarify.  If you requested that your care partner not be given the details of your procedure findings, then the procedure report has been included in a sealed envelope for you to review at your convenience later.  YOU SHOULD EXPECT: Some feelings of bloating in the abdomen. Passage of more gas than usual.  Walking can help get rid of the air that was put into your GI tract during the procedure and reduce the bloating. If you had a lower endoscopy (such as a colonoscopy or flexible sigmoidoscopy) you may notice spotting of blood in your stool or on the toilet paper. If you underwent a bowel prep for your procedure, you may not have a normal bowel movement for a few days.  Please Note:  You might notice some irritation and congestion in your nose or some drainage.  This is from the oxygen used during your procedure.  There is no need for concern and it should clear up in a day or so.  SYMPTOMS TO REPORT IMMEDIATELY:  Following lower endoscopy (colonoscopy or flexible sigmoidoscopy):  Excessive amounts of blood in the stool  Significant tenderness or worsening of abdominal pains  Swelling of the abdomen that is new, acute  Fever of 100F or higher  Following upper endoscopy (EGD)  Vomiting of blood or coffee ground material  New chest pain or pain under the shoulder blades  Painful or persistently difficult swallowing  New shortness of breath  Fever of 100F or higher  Black, tarry-looking stools  For urgent or emergent  issues, a gastroenterologist can be reached at any hour by calling [phone]. Do not use MyChart messaging for urgent concerns.    DIET:  We do recommend a small meal at first, but then you may proceed to your regular diet.  Drink plenty of fluids but you should avoid alcoholic beverages for 24 hours.  ACTIVITY:  You should plan to take it easy for the rest of today and you should NOT DRIVE or use heavy machinery until tomorrow (because of the sedation medicines used during the test).    FOLLOW UP: Our staff will call the number listed on your records the next business day following your procedure.  We will call around 7:15- 8:00 am to check on you and address any questions or concerns that you may have regarding the information given to you following your procedure. If we do not reach you, we will leave a message.     If any biopsies were taken you will be contacted by phone or by letter within the next 1-3 weeks.  Please call us  at [phone] if you have not heard about the biopsies in 3 weeks.    SIGNATURES/CONFIDENTIALITY: You and/or your care partner have signed paperwork which will be entered into your electronic medical record.  These signatures attest to the fact that that the information above on your After Visit Summary has been reviewed and is understood.  Full responsibility of the confidentiality of this discharge information lies with you and/or your care-partner.   Hiatal Hernia  A hiatal hernia occurs when part of the stomach slides above the muscle that separates the abdomen from the chest (diaphragm). A person can be born with a hiatal hernia (congenital), or it may develop over time. In almost all cases of hiatal hernia, only the top part of the stomach pushes through the diaphragm. Many people have a hiatal hernia with no symptoms. The larger the hernia, the more likely it is that you will have symptoms. In some cases, a hiatal hernia allows stomach acid to flow back  into the tube that carries food from your mouth to your stomach (esophagus). This may cause heartburn symptoms. The development of heartburn symptoms may mean that you have a condition called gastroesophageal reflux disease (GERD). What are the causes? This condition is caused by a weakness in the opening (hiatus) where the esophagus passes through the diaphragm to attach to the upper part of the stomach. A person may be born with a weakness in the hiatus, or a weakness can develop over time. What increases the risk? This condition is more likely to develop in: Older people. Age is a major risk factor for a hiatal hernia, especially if you are over the age of 59. Pregnant women. People who are overweight. People who have frequent constipation. What are the signs or symptoms? Symptoms of this condition usually develop in the form of GERD symptoms. Symptoms include: Heartburn. Upset stomach (indigestion). Trouble swallowing. Coughing or wheezing. Wheezing is making high-pitched whistling sounds when you breathe. Sore throat. Chest pain. Nausea and vomiting. How is this diagnosed? This condition may be diagnosed during testing for GERD. Tests that may be done include: X-rays of your stomach or chest. An upper gastrointestinal (GI) series. This is an X-ray exam of your GI tract that is taken after you swallow a chalky liquid that shows up clearly on the X-ray. Endoscopy. This is a procedure to look into your stomach using a thin, flexible tube that has a tiny camera and light on the end of it. How is this treated? This condition may be treated by: Dietary and lifestyle changes to help reduce GERD symptoms. Medicines. These may include: Over-the-counter antacids. Medicines that make your stomach empty more quickly. Medicines that block the production of stomach acid (H2 blockers). Stronger medicines to reduce stomach acid (proton pump inhibitors). Surgery to repair the hernia, if other  treatments are not helping. If you have no symptoms, you may not need treatment. Follow these instructions at home: Lifestyle and activity Do not use any products that contain nicotine or tobacco. These products include cigarettes, chewing tobacco, and vaping devices, such as e-cigarettes. If you need help quitting, ask your health care provider. Try to achieve and maintain a healthy body weight. Avoid putting pressure on your abdomen. Anything that puts pressure on your abdomen increases the amount of acid that may be pushed up into your esophagus. Avoid bending over, especially after eating. Raise the head of your bed by putting blocks under the legs. This keeps your head and esophagus higher than your stomach. Do not wear tight clothing around your chest or stomach. Try not to strain when having a bowel movement, when urinating, or when lifting heavy objects. Eating and drinking Avoid foods that can worsen GERD symptoms. These may include: Fatty foods, like fried foods. Citrus fruits, like oranges or lemon. Other foods and drinks that contain acid, like orange juice or tomatoes. Spicy food. Chocolate. Eat frequent small meals instead of three large meals a day. This  helps prevent your stomach from getting too full. Eat slowly. Do not lie down right after eating. Do not eat 1-2 hours before bed. Do not drink beverages with caffeine. These include cola, coffee, cocoa, and tea. Do not drink alcohol. General instructions Take over-the-counter and prescription medicines only as told by your health care provider. Keep all follow-up visits. Your health care provider will want to check that any new prescribed medicines are helping your symptoms. Contact a health care provider if: Your symptoms are not controlled with medicines or lifestyle changes. You are having trouble swallowing. You have coughing or wheezing that will not go away. Your pain is getting worse. Your pain spreads to your  arms, neck, jaw, teeth, or back. You feel nauseous or you vomit. Get help right away if: You have shortness of breath. You vomit blood. You have bright red blood in your stools. You have black, tarry stools. These symptoms may be an emergency. Get help right away. Call 911. Do not wait to see if the symptoms will go away. Do not drive yourself to the hospital. Summary A hiatal hernia occurs when part of the stomach slides above the muscle that separates the abdomen from the chest. A person may be born with a weakness in the hiatus, or a weakness can develop over time. Symptoms of a hiatal hernia may include heartburn, trouble swallowing, or sore throat. Management of a hiatal hernia includes eating frequent small meals instead of three large meals a day. Get help right away if you vomit blood, have bright red blood in your stools, or have black, tarry stools. This information is not intended to replace advice given to you by your health care provider. Make sure you discuss any questions you have with your health care provider. Document Revised: 01/08/2022 Document Reviewed: 01/08/2022 Elsevier Patient Education  2024 [employer].

## 2024-07-29 NOTE — Op Note (Signed)
 [facility] Patient Name: Theresa Holmes Procedure Date: 07/29/2024 8:21 AM MRN: [id number] Endoscopist: Norleen SAILOR. Abran , MD, [id number] Age: 83 Referring MD:  Date of Birth: [birth year] Gender: Female Account #: [id number] Procedure:                Upper GI endoscopy Indications:              Iron deficiency anemia Medicines:                Monitored Anesthesia Care Procedure:                Pre-Anesthesia Assessment:                           - Prior to the procedure, a History and Physical                            was performed, and patient medications and                            allergies were reviewed. The patient's tolerance of                            previous anesthesia was also reviewed. The risks                            and benefits of the procedure and the sedation                            options and risks were discussed with the patient.                            All questions were answered, and informed consent                            was obtained. Prior Anticoagulants: The patient has                            taken no anticoagulant or antiplatelet agents. ASA                            Grade Assessment: II - A patient with mild systemic                            disease. After reviewing the risks and benefits,                            the patient was deemed in satisfactory condition to                            undergo the procedure.                           After obtaining informed consent, the endoscope was  passed under direct vision. Throughout the                            procedure, the patient's blood pressure, pulse, and                            oxygen saturations were monitored continuously. The                            GIF [id number] #[id number] was introduced through the                            mouth, and advanced to the second part of duodenum.                            The upper GI endoscopy was  accomplished without                            difficulty. The patient tolerated the procedure                            well. Scope In: Scope Out: Findings:                 The esophagus was somewhat dilated with irregular                            Z-line. Mild inflammation..                           The stomach revealed a large hiatal hernia with a                            few small Cameron erosions. Multiple benign fundic                            gland polyps. No bleeding or significant mucosal                            abnormalities otherwise..                           The examined duodenum was normal.                           The cardia and gastric fundus were normal on                            retroflexion, save hiatal hernia. Complications:            No immediate complications. Estimated Blood Loss:     Estimated blood loss: none. Impression:               1. Large hiatal hernia with a few Cameron erosions.  This is a likely cause for iron deficiency anemia.                           2. Multiple benign fundic gland polyps                           3. Otherwise unremarkable EGD. Recommendation:           - Patient has a contact number available for                            emergencies. The signs and symptoms of potential                            delayed complications were discussed with the                            patient. Return to normal activities tomorrow.                            Written discharge instructions were provided to the                            patient.                           - Resume previous diet.                           - Continue present medications.                           - Please obtain iron supplement over-the-counter                            take 1 daily for                           -Return to the care of Dr. Nichole                           NOTE. Please see colonoscopy report regarding                             atrial fibrillation Keagon Glascoe N. Abran, MD 07/29/2024 9:56:55 AM This report has been signed electronically.

## 2024-07-29 NOTE — Progress Notes (Signed)
 HISTORY OF PRESENT ILLNESS:  Theresa Holmes is a 83 y.o. female with past medical history as listed below.  I was contacted last week by her PCP regarding iron deficiency anemia and the need for endoscopic procedures.  Review of the records shows that she had a colonoscopy in 2013 which revealed pandiverticulosis and a hyperplastic cecal polyp.  Upper endoscopy in 2013 and again in 2015 revealed a hiatal hernia and multiple fundic gland type polyps confirmed with biopsy.  She is now for colonoscopy and upper endoscopy.  I spoke to the patient and her husband preprocedure.  REVIEW OF SYSTEMS:  All non-GI ROS negative except for  Past Medical History:  Diagnosis Date   Arthritis    Atypical chest pain 10/22/2016   Cataract    Chronic back pain    Exogenous obesity    GERD (gastroesophageal reflux disease)    H/O hiatal hernia    History of kidney stones    Hypercholesterolemia    Hyperlipidemia    Hypertension    12/14/2018 pt. denies   Hypothyroidism    HX PARTIAL THYROIDECTOMY FOR  NODULE- BENIGN   Shingles 11/2012   Spinal stenosis    Thyroid  mass    right, benign   Varicose vein     Past Surgical History:  Procedure Laterality Date   ABDOMINAL HYSTERECTOMY  1985   ANTERIOR CERVICAL DECOMP/DISCECTOMY FUSION N/A 06/17/2022   Procedure: ANTERIOR CERVICAL DISCECTOMY AND FUSION CERVICAL FIVE TO SEVEN;  Surgeon: Burnetta Aures, MD;  Location: [facility] OR;  Service: Orthopedics;  Laterality: N/A;  3.5 hrs 3 C-bed   ANTERIOR LAT LUMBAR FUSION N/A 12/16/2018   Procedure: ANTERIOR LATERAL LUMBAR FUSION L 3-4 with lateral plate;  Surgeon: Burnetta Aures, MD;  Location: [facility] OR;  Service: Orthopedics;  Laterality: N/A;  3 1/2 hours   APPENDECTOMY     BREAST BIOPSY  9/05   stereotactic   CARPAL TUNNEL RELEASE  12/15   right wrist   CHOLECYSTECTOMY     COMBINED AUGMENTATION MAMMAPLASTY AND ABDOMINOPLASTY  1977   EYE SURGERY  12/05   retinal tear-left   HAMMER TOE SURGERY  2002   HERNIA  REPAIR  2004   HIATAL HERNIA REPAIR N/A 01/21/2014   Procedure: LAPAROSCOPIC REPAIR RECURRENT  HIATAL HERNIA REDO NISSEN, upper endoscopy;  Surgeon: Donnice KATHEE Lunger, MD;  Location: [facility] ORS;  Service: General;  Laterality: N/A;   KIDNEY STONE SURGERY  2002   LUMBAR LAMINECTOMY/DECOMPRESSION MICRODISCECTOMY N/A 10/07/2023   Procedure: REVISION LUMBAR ONE-TWO LAMINECTOMY, REAIR CEREBRO SPINAL FLUID LEAK;  Surgeon: Colon Shove, MD;  Location: [facility] OR;  Service: Neurosurgery;  Laterality: N/A;   NISSEN FUNDOPLICATION     with cholecystectomy   ROTATOR CUFF REPAIR  2001   ROTATOR CUFF REPAIR  1/12    left   ROTATOR CUFF REPAIR  5/13    open   SPINAL FUSION  2005   L4  & L5   THYROIDECTOMY, PARTIAL     TONSILLECTOMY     TONSILLECTOMY AND ADENOIDECTOMY     as a child   UPPER ENDOSCOPY W/ SCLEROTHERAPY  2000   VARICOSE VEIN SURGERY  1977   VENTRICULOPERITONEAL SHUNT N/A 11/13/2023   Procedure: Lumbar Peritoneal Shunt;  Surgeon: Colon Shove, MD;  Location: [facility] OR;  Service: Neurosurgery;  Laterality: N/A;  C3    Social History Nielle Duford Biscardi  reports that she has never smoked. She has never used smokeless tobacco. She reports current alcohol use of about 1.0 standard  drink of alcohol per week. She reports that she does not use drugs.  family history includes Alzheimer's disease in her cousin; Breast cancer (age of onset: 31) in her maternal grandmother; Heart failure in her father; Kidney failure in her father; Pulmonary embolism in her mother.  No Known Allergies     PHYSICAL EXAMINATION: Vital signs: BP (!) 90/52   Pulse 85   Temp 97.7 F (36.5 C)   Resp 20   Ht 4' 11.5 (1.511 m)   Wt 141 lb 12.8 oz (64.3 kg)   LMP 11/26/1983   SpO2 100%   BMI 28.16 kg/m  General: Well-developed, well-nourished, no acute distress HEENT: Sclerae are anicteric, conjunctiva pink. Oral mucosa intact Lungs: Clear Heart: Regular Abdomen: soft, nontender, nondistended, no obvious ascites, no  peritoneal signs, normal bowel sounds. No organomegaly. Extremities: No edema Psychiatric: alert and oriented x3. Cooperative     ASSESSMENT:  Iron deficiency anemia   PLAN:   Colonoscopy and upper endoscopy

## 2024-07-29 NOTE — Progress Notes (Signed)
 Received patient in PACU post endoscopic procedure. New Afib onset during procedure. Upon arrival VS 81/56, HR 110'S-120's, R 12, SPO2 98%. EKG obtained. Patient awake, alert and oriented and denies any symptoms. Dr. Abran aware.

## 2024-07-29 NOTE — Progress Notes (Signed)
 VS by CL  Pt ate solid food until 1800 07/28/24. Finished all prep. Still with brown liquid stool, unsure if there are any solids. Dr. Abran made aware. Verbal order for enema given.   Enema administered by patient and resulted with liquid cloudy brown stool.

## 2024-07-29 NOTE — Op Note (Signed)
 [facility] Patient Name: Theresa Holmes Procedure Date: 07/29/2024 8:21 AM MRN: [id number] Endoscopist: Norleen SAILOR. Abran , MD, [id number] Age: 83 Referring MD:  Date of Birth: 1941-04-13 Gender: Female Account #: [id number] Procedure:                Colonoscopy Indications:              Iron deficiency anemia. Colonoscopy also 2030 with                            diverticulosis and hyperplastic cecal polyp Medicines:                Monitored Anesthesia Care Procedure:                Pre-Anesthesia Assessment:                           - Prior to the procedure, a History and Physical                            was performed, and patient medications and                            allergies were reviewed. The patient's tolerance of                            previous anesthesia was also reviewed. The risks                            and benefits of the procedure and the sedation                            options and risks were discussed with the patient.                            All questions were answered, and informed consent                            was obtained. Prior Anticoagulants: The patient has                            taken no anticoagulant or antiplatelet agents. ASA                            Grade Assessment: II - A patient with mild systemic                            disease. After reviewing the risks and benefits,                            the patient was deemed in satisfactory condition to                            undergo the procedure.  After obtaining informed consent, the colonoscope                            was passed under direct vision. Throughout the                            procedure, the patient's blood pressure, pulse, and                            oxygen saturations were monitored continuously. The                            [id number] Slim SN [id number] was introduced through                            the anus and  advanced to the the cecum, identified                            by appendiceal orifice and ileocecal valve. The                            ileocecal valve, appendiceal orifice, and rectum                            were photographed. The quality of the bowel                            preparation was adequate. The colonoscopy was                            performed without difficulty. The patient tolerated                            the procedure well. The bowel preparation used was                            SUPREP via split dose instruction. Scope In: 8:38:59 AM Scope Out: 9:21:22 AM Scope Withdrawal Time: 0 hours 9 minutes 20 seconds  Total Procedure Duration: 0 hours 42 minutes 23 seconds  Findings:                 A single angiodysplastic lesion without bleeding                            was found in the proximal ascending colon.                           Many diverticula were found in the entire colon.                            There was marked rectosigmoid stenosis which                            necessitated exchange of the adult scope to the  standard pediatric scope and eventually to the                            ultrathin scope.                           The exam was otherwise without abnormality on                            direct and retroflexion views. Complications:            No immediate complications. Estimated blood loss:                            None. Estimated Blood Loss:     Estimated blood loss: none. Impression:               - A single non-bleeding colonic angiodysplastic                            lesion.                           - Diverticulosis in the entire examined colon.                            Rectosigmoid stenosis.                           - The examination was otherwise normal on direct                            and retroflexion views.                           - EGD today. Please see report                            NOTE: During the colonoscopy patient went into                            atrial fibrillation with rapid ventricular response                            and transient drop in blood pressure. This quickly                            improved to atrial fibrillation with controlled                            rate improved blood pressure. She is stable. No                            prior cardiac history.                           EKG to be obtained in recovery. The patient's  primary care physician notified. They will reach                            out to the patient and her husband today regarding                            further plans.                           . Recommendation:           - Repeat colonoscopy is not recommended for                            surveillance.                           - Patient has a contact number available for                            emergencies. The signs and symptoms of potential                            delayed complications were discussed with the                            patient. Return to normal activities tomorrow.                            Written discharge instructions were provided to the                            patient.                           - Resume previous diet.                           - Continue present medications. Norleen SAILOR. Abran, MD 07/29/2024 9:52:34 AM This report has been signed electronically.

## 2024-07-30 ENCOUNTER — Telehealth: Payer: Self-pay | Admitting: *Deleted

## 2024-07-30 NOTE — Telephone Encounter (Signed)
No answer on follow up call. 

## 2024-08-18 ENCOUNTER — Ambulatory Visit (HOSPITAL_COMMUNITY): Admitting: Physician Assistant

## 2024-08-18 ENCOUNTER — Ambulatory Visit (HOSPITAL_COMMUNITY): Payer: Self-pay | Admitting: Cardiology

## 2024-08-18 ENCOUNTER — Other Ambulatory Visit (HOSPITAL_COMMUNITY): Payer: Self-pay | Admitting: Cardiology

## 2024-08-18 ENCOUNTER — Inpatient Hospital Stay (HOSPITAL_COMMUNITY)
Admission: RE | Admit: 2024-08-18 | Discharge: 2024-08-18 | Disposition: A | Discharge status: Home / Self Care | Source: Ambulatory Visit | Attending: Cardiology | Admitting: Cardiology

## 2024-08-18 ENCOUNTER — Ambulatory Visit (HOSPITAL_COMMUNITY)
Admission: RE | Admit: 2024-08-18 | Discharge: 2024-08-18 | Disposition: A | Discharge status: Home / Self Care | Source: Ambulatory Visit | Attending: Cardiology | Admitting: Cardiology

## 2024-08-18 VITALS — BP 120/76 | HR 73 | Wt 145.0 lb

## 2024-08-18 DIAGNOSIS — I4891 Unspecified atrial fibrillation: Secondary | ICD-10-CM | POA: Diagnosis present

## 2024-08-18 DIAGNOSIS — R413 Other amnesia: Secondary | ICD-10-CM | POA: Insufficient documentation

## 2024-08-18 DIAGNOSIS — K449 Diaphragmatic hernia without obstruction or gangrene: Secondary | ICD-10-CM | POA: Insufficient documentation

## 2024-08-18 DIAGNOSIS — I480 Paroxysmal atrial fibrillation: Secondary | ICD-10-CM | POA: Diagnosis not present

## 2024-08-18 DIAGNOSIS — D509 Iron deficiency anemia, unspecified: Secondary | ICD-10-CM | POA: Insufficient documentation

## 2024-08-18 DIAGNOSIS — I509 Heart failure, unspecified: Secondary | ICD-10-CM | POA: Insufficient documentation

## 2024-08-18 DIAGNOSIS — I5020 Unspecified systolic (congestive) heart failure: Secondary | ICD-10-CM | POA: Insufficient documentation

## 2024-08-18 DIAGNOSIS — Z79890 Hormone replacement therapy (postmenopausal): Secondary | ICD-10-CM | POA: Insufficient documentation

## 2024-08-18 DIAGNOSIS — Z79899 Other long term (current) drug therapy: Secondary | ICD-10-CM | POA: Insufficient documentation

## 2024-08-18 DIAGNOSIS — Z9889 Other specified postprocedural states: Secondary | ICD-10-CM | POA: Insufficient documentation

## 2024-08-18 DIAGNOSIS — E039 Hypothyroidism, unspecified: Secondary | ICD-10-CM | POA: Insufficient documentation

## 2024-08-18 LAB — LIPID PANEL
Cholesterol: 153 mg/dL (ref 0–200)
HDL: 63 mg/dL (ref >40)
LDL Cholesterol: 79 mg/dL (ref 0–99)
Total CHOL/HDL Ratio: 2.4 ratio
Triglycerides: 55 mg/dL (ref <150)
VLDL: 11 mg/dL (ref 0–40)

## 2024-08-18 LAB — CBC
HCT: 35.3 % — ABNORMAL LOW (ref 36.0–46.0)
Hemoglobin: 10.7 g/dL — ABNORMAL LOW (ref 12.0–15.0)
MCH: 24 pg — ABNORMAL LOW (ref 26.0–34.0)
MCHC: 30.3 g/dL (ref 30.0–36.0)
MCV: 79.1 fL — ABNORMAL LOW (ref 80.0–100.0)
Platelets: 329 K/uL (ref 150–400)
RBC: 4.46 MIL/uL (ref 3.87–5.11)
RDW: 20.6 % — ABNORMAL HIGH (ref 11.5–15.5)
WBC: 5.7 K/uL (ref 4.0–10.5)
nRBC: 0 % (ref 0.0–0.2)

## 2024-08-18 LAB — BASIC METABOLIC PANEL WITH GFR
Anion gap: 8 (ref 5–15)
BUN: 17 mg/dL (ref 8–23)
CO2: 26 mmol/L (ref 22–32)
Calcium: 8.6 mg/dL — ABNORMAL LOW (ref 8.9–10.3)
Chloride: 105 mmol/L (ref 98–111)
Creatinine, Ser: 0.7 mg/dL (ref 0.44–1.00)
GFR, Estimated: >60 mL/min (ref >60)
Glucose, Bld: 91 mg/dL (ref 70–99)
Potassium: 4.1 mmol/L (ref 3.5–5.1)
Sodium: 139 mmol/L (ref 135–145)

## 2024-08-18 LAB — MAGNESIUM: Magnesium: 2.1 mg/dL (ref 1.7–2.4)

## 2024-08-18 LAB — TSH: TSH: <0.1 u[IU]/mL — ABNORMAL LOW (ref 0.350–4.500)

## 2024-08-18 MED ORDER — APIXABAN 5 MG PO TABS: 5.0000 mg | ORAL_TABLET | Freq: Every day | ORAL | 11 refills | Status: AC

## 2024-08-18 NOTE — Progress Notes (Signed)
 PCP: Nichole Senior, MD Cardiology: Dr. Rolan  Chief complaint: Atrial fibrillation  83 y.o. with history of hypothyroidism, hiatal hernia/Fe deficiency anemia, and back surgery was referred for evaluation of newly-diagnosed atrial fibrillation.  Patient has minimal cardiac history.  She had a normal Cardiolite in 2017.  A chest CT in 12/24 showed coronary artery calcification.  In 9/25, she was sent for colonoscopy due to Fe deficiency anemia.  During the colonoscopy, she she was noted to go into atrial fibrillation with RVR.  She initially became hypotensive.  She was bolused with IVF and HR came down.  She eventually spontaneously converted back to NSR. Since then, she does not think that she has gone back into atrial fibrillation.  She has not felt any palpitations.  She will wear her husband's Apple Watch once or twice a day and it always says sinus rhythm.  Symptomatically, she has been doing reasonably well.  She has some memory impairment and is on donepezil .  Her husband gives much of the history.  No exertional dyspnea or chest pain.  No lightheadedness or syncope.  No orthopnea/PND. She does not get much exercise due to her back pain but is able to get around the house and stores without problems. She is a nonsmoker, rarely drinks ETOH.  She is not hypertensive.  Per her husband, she does snore at night. She denies BRBPR/melena.   ECG (personally reviewed): NSR, poor RWP  Labs (12/24): K 3.6, creatinine 0.89  PMH: 1. Atrial fibrillation: Paroxysmal, noted at colonoscopy in 9/25.  2. Fe deficiency anemia: She has a hiatal hernia with Ole erosions.  She has a history of prior Nissen fundoplication.  3. Memory impairment 4. Hypothyroidism 5. 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  6. CAD: Coronary calcification noted on CT chest in 12/24.  - Cardiolite in 2017 was normal.   SH: Married, lives in [place], 3  children, rare ETOH, nonsmoker.   Family History  Problem Relation Age of Onset   Pulmonary embolism Mother        amniotic fluid embolism   Kidney failure Father        complete shut down   Heart failure Father    Breast cancer Maternal Grandmother 94   Alzheimer's disease Cousin    Colon cancer Neg Hx    Esophageal cancer Neg Hx    Rectal cancer Neg Hx    Stomach cancer Neg Hx    ROS: All systems reviewed and negative except as per HPI.   Current Outpatient Medications  Medication Sig Dispense Refill   atorvastatin  (LIPITOR) 10 MG tablet Take 10 mg by mouth 2 (two) times a week. Takes on Wednesday and Sunday     cyanocobalamin (VITAMIN B12) 1000 MCG tablet 1 tablet Orally Once a day     donepezil  (ARICEPT ) 10 MG tablet Take 10 mg by mouth at bedtime.     FERROUS SULFATE PO Take 1 tablet by mouth daily.     GEMTESA 75 MG TABS Take 75 mg by mouth daily.     HYDROcodone -acetaminophen  (NORCO/VICODIN) 5-325 MG tablet take 1 tablet by mouth every 6 hours if needed     levothyroxine  (SYNTHROID ) 150 MCG tablet Take 150 mcg by mouth daily before breakfast.     Multiple Vitamins-Minerals (MULTIVITAMIN WITH MINERALS) tablet Take 1 tablet by mouth daily.     Omega 3 1200 MG CAPS 1 capsule.     omeprazole  (PRILOSEC) 40 MG capsule  Take 40 mg by mouth daily.     temazepam  (RESTORIL ) 15 MG capsule Take 15 mg by mouth at bedtime as needed for sleep (every other).     apixaban  (ELIQUIS ) 5 MG TABS tablet Take 1 tablet (5 mg total) by mouth daily. 60 tablet 11   No current facility-administered medications for this encounter.   BP 120/76   Pulse 73   Wt 65.8 kg (145 lb)   LMP 11/26/1983   SpO2 98%   BMI 28.80 kg/m  General: NAD Neck: No JVD, no thyromegaly or thyroid  nodule.  Lungs: Clear to auscultation bilaterally with normal respiratory effort. CV: Nondisplaced PMI.  Heart regular S1/S2, no S3/S4, no murmur.  No peripheral edema.  No carotid bruit.  Normal pedal pulses.  Abdomen: Soft,  nontender, no hepatosplenomegaly, no distention.  Skin: Intact without lesions or rashes.  Neurologic: Alert and oriented x 3.  Psych: Normal affect. Extremities: No clubbing or cyanosis.  HEENT: Normal.   1. Atrial fibrillation: Paroxysmal.  Noted in 9/25 at time of colonoscopy. She initially had AF with RVR and hypotension. No documented recurrence.  She checks with an Apple Watch but only wears the watch once or twice  a day. CHADSVASC 3 (?4) for age, female gender, +/- vascular disease with coronary calcification by CT.  She is on apixaban  2.5 mg bid.  - I will arrange for echo.  - I will arrange for Zio monitor for 2 wks of continuous monitoring to look for AF recurrence.  - We discussed Eliquis  dosing.  For her age/weight/creatinine, 5 mg bid would be the recommended dose. She does have Fe deficiency anemia but has had no overt bleeding.  She is very concerned about the possibility of CVA as she has some baseline memory impairment. Therefore, I will increase Eliquis  to 5 mg bid but will get a CBC today and in 3-4 weeks.  - Given snoring and possible OSA as AF risk factor for her, we'll give her a home sleep study.  - No beta blocker/calcium  channel blocker unless she has frequent AF/AFL runs.  2. Fe deficiency anemia: Possibly due to [facility] erosions with hiatal hernia.  - Continue PPI.  - Will need to follow CBC closely on full dose apixaban .  3. Hypothyroidism: Check TSH, she is on Levoxyl  (make sure not over-corrected).   Followup in 2 months.   I spent 56 minutes reviewing records, interviewing/examining patient, and managing orders.    Ezra Shuck 08/18/2024

## 2024-08-18 NOTE — Patient Instructions (Signed)
 INCREASE Eliquis  to 5 mg Twice daily  Labs done today, your results will be available in MyChart, we will contact you for abnormal readings.  Repeat blood work in 1 month  Your physician has requested that you have an echocardiogram. Echocardiography is a painless test that uses sound waves to create images of your heart. It provides your doctor with information about the size and shape of your heart and how well your heart's chambers and valves are working. This procedure takes approximately one hour. There are no restrictions for this procedure. Please do NOT wear cologne, perfume, aftershave, or lotions (deodorant is allowed). Please arrive 15 minutes prior to your appointment time.  Please note: We ask at that you not bring children with you during ultrasound (echo/ vascular) testing. Due to room size and safety concerns, children are not allowed in the ultrasound rooms during exams. Our front office staff cannot provide observation of children in our lobby area while testing is being conducted. An adult accompanying a patient to their appointment will only be allowed in the ultrasound room at the discretion of the ultrasound technician under special circumstances. We apologize for any inconvenience.  Your provider has recommended that  you wear a Zio Patch for 14 days.  This monitor will record your heart rhythm for our review.  IF you have any symptoms while wearing the monitor please press the button.  If you have any issues with the patch or you notice a red or orange light on it please call the company at [phone].  Once you remove the patch please mail it back to the company as soon as possible so we can get the results.  You have been referred to Pulmonology for sleep study test. They will call you to arrange your appointment.  Your physician recommends that you schedule a follow-up appointment in: 2 months.  If you have any questions or concerns before your next appointment please  send us  a message through [place] or call our office at [phone].    TO LEAVE A MESSAGE FOR THE NURSE SELECT OPTION 2, PLEASE LEAVE A MESSAGE INCLUDING: YOUR NAME DATE OF BIRTH CALL BACK NUMBER REASON FOR CALL**this is important as we prioritize the call backs  YOU WILL RECEIVE A CALL BACK THE SAME DAY AS LONG AS YOU CALL BEFORE 4:00 PM  At the [facility], you and your health needs are our priority. As part of our continuing mission to provide you with exceptional heart care, we have created designated Provider Care Teams. These Care Teams include your primary Cardiologist (physician) and Advanced Practice Providers (APPs- Physician Assistants and Nurse Practitioners) who all work together to provide you with the care you need, when you need it.   You may see any of the following providers on your designated Care Team at your next follow up: Dr Toribio Fuel Dr Ezra Shuck Dr. Ria Commander Dr. Morene Brownie Amy Lenetta, NP Caffie Shed, [place] [facility] [place], [place] Beckey Coe, NP [place] Lee, NP Ellouise Class, NP Tinnie Redman, PharmD Jaun Bash, PharmD   Please be sure to bring in all your medications bottles to every appointment.    Thank you for choosing [facility]

## 2024-09-06 NOTE — Addendum Note (Signed)
 Encounter addended by: Debarah Garrison MATSU, RN on: 09/06/2024 2:32 PM  Actions taken: Imaging Exam ended

## 2024-09-10 ENCOUNTER — Ambulatory Visit (HOSPITAL_COMMUNITY): Payer: Self-pay | Admitting: Cardiology

## 2024-09-14 NOTE — Telephone Encounter (Signed)
-----   Message from Ezra Shuck sent at 09/10/2024  4:19 PM EDT ----- Some short runs of possible atrial fibrillation, no prolonged runs of atrial fibrillation.  No changes.  ----- Message ----- From: Shuck Ezra RAMAN, MD Sent: 09/10/2024   4:19 PM EDT To: Ezra RAMAN Shuck, MD

## 2024-09-14 NOTE — Telephone Encounter (Signed)
 Spoke to patient regarding  long term monitor results. My chart message also sent

## 2024-09-16 ENCOUNTER — Other Ambulatory Visit (HOSPITAL_COMMUNITY)

## 2024-09-23 ENCOUNTER — Ambulatory Visit (HOSPITAL_COMMUNITY)
Admission: RE | Admit: 2024-09-23 | Discharge: 2024-09-23 | Disposition: A | Discharge status: Home / Self Care | Source: Ambulatory Visit | Attending: Internal Medicine | Admitting: Internal Medicine

## 2024-09-23 DIAGNOSIS — I5020 Unspecified systolic (congestive) heart failure: Secondary | ICD-10-CM | POA: Diagnosis present

## 2024-09-23 LAB — CBC
HCT: 34.1 % — ABNORMAL LOW (ref 36.0–46.0)
Hemoglobin: 10.9 g/dL — ABNORMAL LOW (ref 12.0–15.0)
MCH: 25.3 pg — ABNORMAL LOW (ref 26.0–34.0)
MCHC: 32 g/dL (ref 30.0–36.0)
MCV: 79.3 fL — ABNORMAL LOW (ref 80.0–100.0)
Platelets: 371 K/uL (ref 150–400)
RBC: 4.3 MIL/uL (ref 3.87–5.11)
RDW: 18.6 % — ABNORMAL HIGH (ref 11.5–15.5)
WBC: 8.4 K/uL (ref 4.0–10.5)
nRBC: 0 % (ref 0.0–0.2)

## 2024-10-04 ENCOUNTER — Telehealth: Payer: Self-pay | Admitting: Neurology

## 2024-10-04 NOTE — Telephone Encounter (Signed)
 Patient's husband called patient having delusions (think she is somewhere she is not) confusion.  Informed Mr. Spiers patient has only been here one time in 2022. Ask Mr. Oliphant to have PCP fax over a referral. He verbalize understand.

## 2024-10-18 ENCOUNTER — Ambulatory Visit

## 2024-10-27 ENCOUNTER — Ambulatory Visit (HOSPITAL_COMMUNITY)
Admission: RE | Admit: 2024-10-27 | Discharge: 2024-10-27 | Disposition: A | Source: Ambulatory Visit | Attending: Cardiology | Admitting: Cardiology

## 2024-10-27 ENCOUNTER — Ambulatory Visit (HOSPITAL_COMMUNITY): Payer: Self-pay | Admitting: Cardiology

## 2024-10-27 VITALS — BP 138/88 | HR 68 | Wt 142.8 lb

## 2024-10-27 DIAGNOSIS — I4891 Unspecified atrial fibrillation: Secondary | ICD-10-CM

## 2024-10-27 DIAGNOSIS — I5020 Unspecified systolic (congestive) heart failure: Secondary | ICD-10-CM | POA: Diagnosis not present

## 2024-10-27 LAB — CBC
HCT: 38.7 % (ref 36.0–46.0)
Hemoglobin: 12.3 g/dL (ref 12.0–15.0)
MCH: 26.1 pg (ref 26.0–34.0)
MCHC: 31.8 g/dL (ref 30.0–36.0)
MCV: 82 fL (ref 80.0–100.0)
Platelets: 310 K/uL (ref 150–400)
RBC: 4.72 MIL/uL (ref 3.87–5.11)
RDW: 17.1 % — ABNORMAL HIGH (ref 11.5–15.5)
WBC: 7.2 K/uL (ref 4.0–10.5)
nRBC: 0 % (ref 0.0–0.2)

## 2024-10-27 LAB — ECHOCARDIOGRAM COMPLETE
AR max vel: 1.76 cm2
AV Area VTI: 1.86 cm2
AV Area mean vel: 1.59 cm2
AV Mean grad: 3 mmHg
AV Peak grad: 4.7 mmHg
Ao pk vel: 1.08 m/s
Area-P 1/2: 3.05 cm2
Calc EF: 68.1 %
MV VTI: 3.35 cm2
S' Lateral: 2.7 cm
Single Plane A2C EF: 66.7 %
Single Plane A4C EF: 65.9 %

## 2024-10-27 LAB — LIPID PANEL
Cholesterol: 181 mg/dL (ref 0–200)
HDL: 66 mg/dL (ref >40)
LDL Cholesterol: 89 mg/dL (ref 0–99)
Total CHOL/HDL Ratio: 2.7 ratio
Triglycerides: 130 mg/dL (ref <150)
VLDL: 26 mg/dL (ref 0–40)

## 2024-10-27 LAB — TSH: TSH: 1.23 u[IU]/mL (ref 0.350–4.500)

## 2024-10-27 NOTE — Addendum Note (Signed)
 Encounter addended by: Rolan Ezra RAMAN, MD on: 10/27/2024 11:38 PM  Actions taken: Clinical Note Signed

## 2024-10-27 NOTE — Patient Instructions (Signed)
 There has been no changes to your medications.  Labs done today, your results will be available in MyChart, we will contact you for abnormal readings.  PLEASE check your blood pressure daily at home fro 1 week, then send in the results.  Your physician recommends that you schedule a follow-up appointment in: 6 months ( June 2026) ** PLEASE CALL THE OFFICE IN APRIL 2026 TO ARRANGE YOUR FOLLOW UP APPOINTMENT.**  If you have any questions or concerns before your next appointment please send us  a message through [place] or call our office at [phone].    TO LEAVE A MESSAGE FOR THE NURSE SELECT OPTION 2, PLEASE LEAVE A MESSAGE INCLUDING: YOUR NAME DATE OF BIRTH CALL BACK NUMBER REASON FOR CALL**this is important as we prioritize the call backs  YOU WILL RECEIVE A CALL BACK THE SAME DAY AS LONG AS YOU CALL BEFORE 4:00 PM  At the [facility], you and your health needs are our priority. As part of our continuing mission to provide you with exceptional heart care, we have created designated Provider Care Teams. These Care Teams include your primary Cardiologist (physician) and Advanced Practice Providers (APPs- Physician Assistants and Nurse Practitioners) who all work together to provide you with the care you need, when you need it.   You may see any of the following providers on your designated Care Team at your next follow up: Dr Toribio Fuel Dr Ezra Shuck Dr. Morene Brownie Greig Mosses, NP Caffie Shed, [place] [facility] [place], [place] Beckey Coe, NP [place] Lee, NP Ellouise Class, NP Tinnie Redman, PharmD Jaun Bash, PharmD   Please be sure to bring in all your medications bottles to every appointment.    Thank you for choosing [facility]

## 2024-10-27 NOTE — Progress Notes (Signed)
  Echocardiogram 2D Echocardiogram has been performed.  Theresa Holmes 10/27/2024, 11:20 AM

## 2024-10-27 NOTE — Progress Notes (Addendum)
 PCP: Nichole Senior, MD Cardiology: Dr. Rolan  Chief complaint: Atrial fibrillation  83 y.o. with history of hypothyroidism, hiatal hernia/Fe deficiency anemia, and back surgery was referred for evaluation of atrial fibrillation.  Patient has minimal cardiac history.  She had a normal Cardiolite in 2017.  A chest CT in 12/24 showed coronary artery calcification.  In 9/25, she was sent for colonoscopy due to Fe deficiency anemia.  During the colonoscopy, she she was noted to go into atrial fibrillation with RVR.  She initially became hypotensive.  She was bolused with IVF and HR came down.  She eventually spontaneously converted back to NSR. She has some memory impairment and is on donepezil .    Zio monitor in 10/25 showed some short SVT runs that may have been AF, nothing prolonged.  TSH was noted to be very low, Levoxyl  dose was adjusted.   Echo was done today and I reviewed, EF 55-60%, normal RV size and systolic function, mild-moderate TR, mild MR, IVC normal.   She returns for followup of atrial fibrillation.  BP was elevated initially today but came down on recheck. BP is not high on home readings.  She is not very active but denies exertional dyspnea or chest pain.  No orthopnea/PND.  She has not felt palpitations, no lightheadedness. No BRPBR/melena on apixaban .   ECG (personally reviewed): NSR, poor RWP  Labs (12/24): K 3.6, creatinine 0.89 Labs (10/25): hgb 10.9, TSH < 0.1, K 4.6, creatinine 0.7  PMH: 1. Atrial fibrillation: Paroxysmal, noted at colonoscopy in 9/25.  - Zio monitor (10/25): some short SVT runs that may have been AF, nothing prolonged. - Echo (12/25): EF 55-60%, normal RV size and systolic function, mild-moderate TR, mild MR, IVC normal.  2. Fe deficiency anemia: She has a hiatal hernia with Ole erosions.  She has a history of prior Nissen fundoplication.  3. Memory impairment 4. Hypothyroidism 5. 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  6. CAD: Coronary calcification noted on CT chest in 12/24.  - Cardiolite in 2017 was normal.   SH: Married, lives in [place], 3 children, rare ETOH, nonsmoker.   Family History  Problem Relation Age of Onset   Pulmonary embolism Mother        amniotic fluid embolism   Kidney failure Father        complete shut down   Heart failure Father    Breast cancer Maternal Grandmother 20   Alzheimer's disease Cousin    Colon cancer Neg Hx    Esophageal cancer Neg Hx    Rectal cancer Neg Hx    Stomach cancer Neg Hx    ROS: All systems reviewed and negative except as per HPI.   Current Outpatient Medications  Medication Sig Dispense Refill   apixaban  (ELIQUIS ) 5 MG TABS tablet Take 1 tablet (5 mg total) by mouth daily. 60 tablet 11   atorvastatin  (LIPITOR) 10 MG tablet Take 10 mg by mouth 2 (two) times a week. Takes on Wednesday and Sunday     cyanocobalamin (VITAMIN B12) 1000 MCG tablet 1 tablet Orally Once a day     donepezil  (ARICEPT ) 10 MG tablet Take 10 mg by mouth at bedtime.     FERROUS SULFATE PO Take 1 tablet by mouth daily.     HYDROcodone -acetaminophen  (NORCO/VICODIN) 5-325 MG tablet take 1 tablet by mouth every 6 hours if needed     levothyroxine  (SYNTHROID ) 150 MCG tablet Take 150 mcg by mouth  daily before breakfast.     Multiple Vitamins-Minerals (MULTIVITAMIN WITH MINERALS) tablet Take 1 tablet by mouth daily.     Omega 3 1200 MG CAPS 1 capsule.     omeprazole  (PRILOSEC) 40 MG capsule Take 40 mg by mouth daily.     temazepam  (RESTORIL ) 15 MG capsule Take 15 mg by mouth at bedtime as needed for sleep (every other).     GEMTESA 75 MG TABS Take 75 mg by mouth daily.     No current facility-administered medications for this encounter.   BP 138/88   Pulse 68   Wt 64.8 kg (142 lb 12.8 oz)   LMP 11/26/1983   SpO2 98%   BMI 28.36 kg/m  General: NAD Neck: No JVD, no thyromegaly or thyroid  nodule.  Lungs: Clear to auscultation  bilaterally with normal respiratory effort. CV: Nondisplaced PMI.  Heart regular S1/S2, no S3/S4, no murmur.  No peripheral edema.  No carotid bruit.  Normal pedal pulses.  Abdomen: Soft, nontender, no hepatosplenomegaly, no distention.  Skin: Intact without lesions or rashes.  Neurologic: Alert and oriented x 3.  Psych: Normal affect. Extremities: No clubbing or cyanosis.  HEENT: Normal.   1. Atrial fibrillation: Paroxysmal.  Noted in 9/25 at time of colonoscopy. She initially had AF with RVR and hypotension.  CHADSVASC 3 (?4) for age, female gender, +/- vascular disease with coronary calcification by CT.  Zio monitor in 10/25 showed some short SVT runs that may have been AF, nothing prolonged.  She is on apixaban  2.5 mg bid. Echo today showed EF 55-60%, normal RV, no significant valvular disease. TSH was low in 10/25 suggesting over-replaced hypothyroidism (on Levoxyl ).  - Levoxyl  was cut back, check TSH today.   - Continue Eliquis  5 mg bid. She does have Fe deficiency anemia but has had no overt bleeding.  She is very concerned about the possibility of CVA as she has some baseline memory impairment. CBC today.  - Given snoring and possible OSA as AF risk factor for her, she will need home sleep study.  - No beta blocker/calcium  channel blocker unless she has frequent AF/AFL runs.  2. Fe deficiency anemia: Possibly due to [facility] erosions with hiatal hernia.  - Continue PPI.  - Will need to follow CBC closely on full dose apixaban .  3. Hypothyroidism: Check TSH to make sure this is normalized on lower Levoxyl  dose.  4. Hyperlipidemia: Check lipids on higher atorvastatin  dose.   5. Elevated BP: BP initially high in the office, lower on recheck.  Per report, normal when she checks at home.  - Check BP daily x 1 week and call in readings.   Followup in 6 months  I spent 31 minutes reviewing records, interviewing/examining patient, and managing orders.    Ezra Shuck 10/27/2024
